# Patient Record
Sex: Female | Born: 1935 | Race: White | Hispanic: No | Smoking: Never smoker
Health system: Southern US, Community
[De-identification: ages and names within clinical notes are randomized; demographics above are authoritative.]

## PROBLEM LIST (undated history)

## (undated) DIAGNOSIS — K219 Gastro-esophageal reflux disease without esophagitis: Secondary | ICD-10-CM

## (undated) DIAGNOSIS — H3530 Unspecified macular degeneration: Secondary | ICD-10-CM

## (undated) DIAGNOSIS — D649 Anemia, unspecified: Secondary | ICD-10-CM

## (undated) DIAGNOSIS — M810 Age-related osteoporosis without current pathological fracture: Secondary | ICD-10-CM

## (undated) DIAGNOSIS — R053 Chronic cough: Secondary | ICD-10-CM

## (undated) DIAGNOSIS — K5901 Slow transit constipation: Secondary | ICD-10-CM

## (undated) DIAGNOSIS — H9190 Unspecified hearing loss, unspecified ear: Secondary | ICD-10-CM

## (undated) DIAGNOSIS — L853 Xerosis cutis: Secondary | ICD-10-CM

## (undated) DIAGNOSIS — E46 Unspecified protein-calorie malnutrition: Secondary | ICD-10-CM

## (undated) DIAGNOSIS — E785 Hyperlipidemia, unspecified: Secondary | ICD-10-CM

## (undated) DIAGNOSIS — R430 Anosmia: Secondary | ICD-10-CM

## (undated) DIAGNOSIS — I10 Essential (primary) hypertension: Secondary | ICD-10-CM

## (undated) DIAGNOSIS — I4891 Unspecified atrial fibrillation: Secondary | ICD-10-CM

## (undated) DIAGNOSIS — R05 Cough: Secondary | ICD-10-CM

## (undated) DIAGNOSIS — C4490 Unspecified malignant neoplasm of skin, unspecified: Secondary | ICD-10-CM

## (undated) DIAGNOSIS — F419 Anxiety disorder, unspecified: Secondary | ICD-10-CM

## (undated) DIAGNOSIS — R630 Anorexia: Secondary | ICD-10-CM

## (undated) DIAGNOSIS — E559 Vitamin D deficiency, unspecified: Secondary | ICD-10-CM

## (undated) HISTORY — DX: Unspecified malignant neoplasm of skin, unspecified: C44.90

## (undated) HISTORY — DX: Unspecified protein-calorie malnutrition: E46

## (undated) HISTORY — DX: Age-related osteoporosis without current pathological fracture: M81.0

## (undated) HISTORY — DX: Hyperlipidemia, unspecified: E78.5

## (undated) HISTORY — DX: Chronic cough: R05.3

## (undated) HISTORY — DX: Unspecified hearing loss, unspecified ear: H91.90

## (undated) HISTORY — DX: Anorexia: R63.0

## (undated) HISTORY — DX: Anosmia: R43.0

## (undated) HISTORY — DX: Anxiety disorder, unspecified: F41.9

## (undated) HISTORY — DX: Xerosis cutis: L85.3

## (undated) HISTORY — DX: Unspecified macular degeneration: H35.30

## (undated) HISTORY — DX: Slow transit constipation: K59.01

## (undated) HISTORY — DX: Gastro-esophageal reflux disease without esophagitis: K21.9

## (undated) HISTORY — DX: Essential (primary) hypertension: I10

## (undated) HISTORY — DX: Unspecified atrial fibrillation: I48.91

## (undated) HISTORY — DX: Anemia, unspecified: D64.9

## (undated) HISTORY — DX: Cough: R05

---
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
## 2004-07-20 ENCOUNTER — Encounter: Admission: RE | Admit: 2004-07-20 | Discharge: 2004-07-20 | Payer: Self-pay | Admitting: Internal Medicine

## 2005-07-26 ENCOUNTER — Encounter: Admission: RE | Admit: 2005-07-26 | Discharge: 2005-07-26 | Payer: Self-pay | Admitting: Internal Medicine

## 2006-08-08 ENCOUNTER — Encounter: Admission: RE | Admit: 2006-08-08 | Discharge: 2006-08-08 | Payer: Self-pay | Admitting: Internal Medicine

## 2007-07-02 ENCOUNTER — Encounter: Payer: Self-pay | Admitting: Endocrinology

## 2007-08-14 DIAGNOSIS — I10 Essential (primary) hypertension: Secondary | ICD-10-CM

## 2007-08-14 DIAGNOSIS — I4891 Unspecified atrial fibrillation: Secondary | ICD-10-CM | POA: Insufficient documentation

## 2007-08-14 DIAGNOSIS — M810 Age-related osteoporosis without current pathological fracture: Secondary | ICD-10-CM | POA: Insufficient documentation

## 2007-08-14 DIAGNOSIS — E785 Hyperlipidemia, unspecified: Secondary | ICD-10-CM | POA: Insufficient documentation

## 2007-08-14 DIAGNOSIS — G4700 Insomnia, unspecified: Secondary | ICD-10-CM

## 2007-08-14 DIAGNOSIS — M1990 Unspecified osteoarthritis, unspecified site: Secondary | ICD-10-CM

## 2007-08-15 ENCOUNTER — Ambulatory Visit: Payer: Self-pay | Admitting: Endocrinology

## 2007-08-15 ENCOUNTER — Encounter: Admission: RE | Admit: 2007-08-15 | Discharge: 2007-08-15 | Payer: Self-pay | Admitting: Internal Medicine

## 2007-08-15 DIAGNOSIS — R079 Chest pain, unspecified: Secondary | ICD-10-CM

## 2007-08-15 DIAGNOSIS — E042 Nontoxic multinodular goiter: Secondary | ICD-10-CM | POA: Insufficient documentation

## 2007-08-29 ENCOUNTER — Encounter: Payer: Self-pay | Admitting: Endocrinology

## 2008-08-15 ENCOUNTER — Encounter: Admission: RE | Admit: 2008-08-15 | Discharge: 2008-08-15 | Payer: Self-pay | Admitting: Internal Medicine

## 2009-08-17 ENCOUNTER — Encounter: Admission: RE | Admit: 2009-08-17 | Discharge: 2009-08-17 | Payer: Self-pay | Admitting: Internal Medicine

## 2010-03-28 ENCOUNTER — Encounter: Payer: Self-pay | Admitting: Internal Medicine

## 2010-05-18 ENCOUNTER — Encounter (INDEPENDENT_AMBULATORY_CARE_PROVIDER_SITE_OTHER): Payer: Medicare Other | Admitting: Vascular Surgery

## 2010-05-18 DIAGNOSIS — I723 Aneurysm of iliac artery: Secondary | ICD-10-CM

## 2010-05-18 NOTE — Consult Note (Signed)
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  She had no discrete abdominal pain and part of her evaluation included a CT scan of the abdomen which revealed a small left common iliac artery aneurysm measuring maximum of 1.8 cm in diameter.  She also had a fibroid tumor on the uterus and will be having a gynecological evaluation in the next week.  She has no previous history of aneurysm disease.  CHRONIC MEDICAL PROBLEMS: 1. Hypertension. 2. Macular degeneration. 3. Mild hyperlipidemia. 4. Bilateral cataracts. 5. History of a benign breast mass. 6. Negative for coronary artery disease, diabetes, COPD, or stroke.  SOCIAL HISTORY:  She is widowed, has no children.  She is a retired Engineer, civil (consulting).  Does not use tobacco or alcohol.  FAMILY HISTORY:  Positive for coronary artery disease and stroke in her sisters.  Negative for diabetes.  REVIEW OF SYSTEMS:  Positive for leg discomfort with walking, occasional swelling in her right leg.  Varicose veins in the right leg.  A history of chest heaviness on 2 occasions in the past with negative cardiac workup.  History of reflux esophagitis.  All other systems in a complete review of systems are negative.  PHYSICAL EXAMINATION:  Blood pressure 111/73, heart rate is 92, respirations 14.  Generally, she is a well-developed, well-nourished female who is in no apparent distress.  She is alert and oriented x3. HEENT:  Normal for age.  EOMs intact.  Lungs:  Clear to auscultation. No rhonchi or wheezing.  Cardiovascular:  Regular rhythm.  No murmurs. Carotid pulses are 3+.  No audible bruits.  Abdomen:  Soft, nontender. No pulsatile mass is noted.  Musculoskeletal:  Free of major deformities.  Neurologic:  Normal.  Skin:  Free of  rashes.  Lower extremity exam reveals some bulging varicosities in the right leg posteriorly in the popliteal fossa.  There is no hyperpigmentation, ulceration, or distal edema noted.  She has 3+ femoral and posterior tibial pulses palpable bilaterally.  Today I reviewed the medical records provided by Dr. Anders Grant office.  I also reviewed the CT scan which was performed on February 28 and interpreted by [facility].  IMPRESSION:  A small left iliac artery aneurysm measuring 1.8 cm in maximum diameter with no other evidence of aneurysmal disease and a small 5 cm fibroid arising from the right lateral wall of an atrophic uterus.  This aneurysm of the right iliac artery is not related to her present illness of nausea, which has subsequently resolved.  It does not need follow-up for 2 years, at which time a CT scan should be performed to be sure this is not enlarging.  Doubtful that this will ever require any treatment.  A CT scan can be obtained by Dr. Shary Decamp, and if there is significant change in the size, we will be happy to see the patient again in followup.    Quita Skye Hart [place], M.D. Electronically Signed  JDL/MEDQ  D:  05/18/2010  T:  05/18/2010  Job:  [id number]  cc:   Feliciana Rossetti, MD Guy Franco, P.A.

## 2010-06-01 ENCOUNTER — Encounter: Payer: Self-pay | Admitting: Vascular Surgery

## 2010-09-09 ENCOUNTER — Other Ambulatory Visit: Payer: Self-pay | Admitting: Internal Medicine

## 2010-09-09 DIAGNOSIS — Z1231 Encounter for screening mammogram for malignant neoplasm of breast: Secondary | ICD-10-CM

## 2010-09-29 ENCOUNTER — Ambulatory Visit
Admission: RE | Admit: 2010-09-29 | Discharge: 2010-09-29 | Disposition: A | Discharge status: Home / Self Care | Payer: 59 | Source: Ambulatory Visit | Attending: Internal Medicine | Admitting: Internal Medicine

## 2010-09-29 DIAGNOSIS — Z1231 Encounter for screening mammogram for malignant neoplasm of breast: Secondary | ICD-10-CM

## 2010-09-30 ENCOUNTER — Other Ambulatory Visit: Payer: Self-pay | Admitting: Internal Medicine

## 2010-09-30 DIAGNOSIS — R928 Other abnormal and inconclusive findings on diagnostic imaging of breast: Secondary | ICD-10-CM

## 2010-10-06 ENCOUNTER — Ambulatory Visit
Admission: RE | Admit: 2010-10-06 | Discharge: 2010-10-06 | Disposition: A | Discharge status: Home / Self Care | Payer: Medicare Other | Source: Ambulatory Visit | Attending: Internal Medicine | Admitting: Internal Medicine

## 2010-10-06 DIAGNOSIS — R928 Other abnormal and inconclusive findings on diagnostic imaging of breast: Secondary | ICD-10-CM

## 2011-09-22 ENCOUNTER — Other Ambulatory Visit: Payer: Self-pay | Admitting: Internal Medicine

## 2011-09-22 DIAGNOSIS — Z1231 Encounter for screening mammogram for malignant neoplasm of breast: Secondary | ICD-10-CM

## 2011-10-13 ENCOUNTER — Ambulatory Visit: Payer: Medicare Other

## 2011-10-18 ENCOUNTER — Ambulatory Visit
Admission: RE | Admit: 2011-10-18 | Discharge: 2011-10-18 | Disposition: A | Discharge status: Home / Self Care | Payer: Medicare Other | Source: Ambulatory Visit | Attending: Internal Medicine | Admitting: Internal Medicine

## 2011-10-18 DIAGNOSIS — Z1231 Encounter for screening mammogram for malignant neoplasm of breast: Secondary | ICD-10-CM

## 2014-06-26 ENCOUNTER — Ambulatory Visit: Payer: Medicare Other

## 2014-06-30 ENCOUNTER — Encounter: Payer: Self-pay | Admitting: Podiatry

## 2014-06-30 ENCOUNTER — Ambulatory Visit (INDEPENDENT_AMBULATORY_CARE_PROVIDER_SITE_OTHER): Payer: Medicare Other | Admitting: Podiatry

## 2014-06-30 VITALS — BP 122/67 | HR 70 | Resp 12

## 2014-06-30 DIAGNOSIS — L609 Nail disorder, unspecified: Secondary | ICD-10-CM | POA: Diagnosis not present

## 2014-06-30 DIAGNOSIS — L608 Other nail disorders: Secondary | ICD-10-CM

## 2014-06-30 NOTE — Progress Notes (Signed)
   Subjective:    Patient ID: Jasmine Jones, female    DOB: 04/25/1925, 81 y.o.   MRN: [id number]  HPI N- SORE L-RT FOOT BALL OF THE FOOT D-7 DAYS O-SLOWLY C-MUCH BETTER A-TRIM T-NONE Patient is caregiver in treatment room today relates a history of a plantar callus that has sloughed recently without any professional treatment. She is also requesting nail debridement  ALSO PT REQUESTING FOR TOENAILS DEBRIDEMENT.   Review of Systems  HENT: Positive for hearing loss.   Eyes: Positive for visual disturbance.  Cardiovascular: Positive for leg swelling.  Musculoskeletal: Positive for back pain, joint swelling and gait problem.  Skin: Positive for color change.  Neurological: Positive for weakness.  Hematological: Bruises/bleeds easily.       Objective:   Physical Exam Confused patient hard of hearing that is not able to answer questions directly presents with her caregiver who is present in the treatment room today  Vascular: DP pulses trace palpable bilaterally PT pulses trace palpable bilaterally Mild pitting edema about ankles bilaterally  Neurological: Ankle reflex equal and reactive bilaterally Vibratory sensation patient not able to respond Sensation to 10 g monofilament wire patient unable respond  Dermatological: Atrophic skin without any skin lesions noted bilaterally Incurvated toenails 6-10   Musculoskeletal: Patient walks slowly with a walker HAV deformity right No restriction ankle, subtalar, midtarsal joints bilaterally        Assessment & Plan:   Assessment: Decrease pedal pulses possibly suggestive peripheral arterial disease Incurvated toenails, non-dystrophic 6-10  no evidence of plantar skin lesions  Plan: I advised caregiver that there was no evidence of skin lesions that needed any active treatment I debrided toenails 6-10 without a bleeding  I recommended that a pedicures trim the toenails with the following recommendations: Do  not soak feet in whirlpool Do not manipulate cuticles  Reappoint at patient's request

## 2014-06-30 NOTE — Patient Instructions (Signed)
Okay to go to the pedicurist to have your toenails trimmed Do not soak in the whirlpool Do not manipulate the cuticles

## 2016-11-13 DIAGNOSIS — K219 Gastro-esophageal reflux disease without esophagitis: Secondary | ICD-10-CM | POA: Diagnosis not present

## 2016-11-13 DIAGNOSIS — R0789 Other chest pain: Secondary | ICD-10-CM | POA: Diagnosis not present

## 2016-11-13 DIAGNOSIS — I10 Essential (primary) hypertension: Secondary | ICD-10-CM | POA: Diagnosis not present

## 2016-11-13 DIAGNOSIS — E785 Hyperlipidemia, unspecified: Secondary | ICD-10-CM | POA: Diagnosis not present

## 2016-11-14 DIAGNOSIS — R0789 Other chest pain: Secondary | ICD-10-CM | POA: Diagnosis not present

## 2016-11-14 DIAGNOSIS — K219 Gastro-esophageal reflux disease without esophagitis: Secondary | ICD-10-CM | POA: Diagnosis not present

## 2016-11-14 DIAGNOSIS — I4891 Unspecified atrial fibrillation: Secondary | ICD-10-CM | POA: Diagnosis not present

## 2016-11-14 DIAGNOSIS — E785 Hyperlipidemia, unspecified: Secondary | ICD-10-CM | POA: Diagnosis not present

## 2016-11-14 DIAGNOSIS — I10 Essential (primary) hypertension: Secondary | ICD-10-CM | POA: Diagnosis not present

## 2016-11-15 DIAGNOSIS — I4891 Unspecified atrial fibrillation: Secondary | ICD-10-CM

## 2017-11-15 ENCOUNTER — Encounter: Payer: Self-pay | Admitting: Nurse Practitioner

## 2018-01-26 DIAGNOSIS — E559 Vitamin D deficiency, unspecified: Secondary | ICD-10-CM

## 2018-01-26 HISTORY — DX: Vitamin D deficiency, unspecified: E55.9

## 2018-06-05 ENCOUNTER — Ambulatory Visit: Payer: Self-pay | Admitting: Nurse Practitioner

## 2018-07-03 ENCOUNTER — Ambulatory Visit: Payer: Self-pay | Admitting: Nurse Practitioner

## 2018-07-09 ENCOUNTER — Ambulatory Visit: Payer: Medicare Other | Admitting: Nurse Practitioner

## 2018-08-14 ENCOUNTER — Ambulatory Visit: Payer: Self-pay | Admitting: Nurse Practitioner

## 2018-08-29 ENCOUNTER — Encounter (HOSPITAL_COMMUNITY): Payer: Self-pay | Admitting: Radiology

## 2018-08-29 ENCOUNTER — Emergency Department (HOSPITAL_COMMUNITY)
Admission: EM | Admit: 2018-08-29 | Discharge: 2018-08-29 | Disposition: A | Discharge status: Skilled Nursing Facility | Payer: Medicare Other | Source: Home / Self Care | Attending: Emergency Medicine | Admitting: Emergency Medicine

## 2018-08-29 ENCOUNTER — Other Ambulatory Visit: Payer: Self-pay

## 2018-08-29 ENCOUNTER — Emergency Department (HOSPITAL_COMMUNITY): Payer: Medicare Other

## 2018-08-29 ENCOUNTER — Emergency Department (HOSPITAL_COMMUNITY)
Admission: EM | Admit: 2018-08-29 | Discharge: 2018-08-29 | Disposition: A | Discharge status: Home / Self Care | Payer: Medicare Other | Attending: Emergency Medicine | Admitting: Emergency Medicine

## 2018-08-29 DIAGNOSIS — I10 Essential (primary) hypertension: Secondary | ICD-10-CM | POA: Insufficient documentation

## 2018-08-29 DIAGNOSIS — S0101XA Laceration without foreign body of scalp, initial encounter: Secondary | ICD-10-CM

## 2018-08-29 DIAGNOSIS — Z23 Encounter for immunization: Secondary | ICD-10-CM | POA: Diagnosis not present

## 2018-08-29 DIAGNOSIS — S61519A Laceration without foreign body of unspecified wrist, initial encounter: Secondary | ICD-10-CM

## 2018-08-29 DIAGNOSIS — Y999 Unspecified external cause status: Secondary | ICD-10-CM | POA: Diagnosis not present

## 2018-08-29 DIAGNOSIS — S0990XA Unspecified injury of head, initial encounter: Secondary | ICD-10-CM

## 2018-08-29 DIAGNOSIS — W1830XA Fall on same level, unspecified, initial encounter: Secondary | ICD-10-CM | POA: Insufficient documentation

## 2018-08-29 DIAGNOSIS — S51011A Laceration without foreign body of right elbow, initial encounter: Secondary | ICD-10-CM | POA: Insufficient documentation

## 2018-08-29 DIAGNOSIS — Y92129 Unspecified place in nursing home as the place of occurrence of the external cause: Secondary | ICD-10-CM | POA: Diagnosis not present

## 2018-08-29 DIAGNOSIS — S0990XD Unspecified injury of head, subsequent encounter: Secondary | ICD-10-CM | POA: Insufficient documentation

## 2018-08-29 DIAGNOSIS — F0390 Unspecified dementia without behavioral disturbance: Secondary | ICD-10-CM | POA: Insufficient documentation

## 2018-08-29 DIAGNOSIS — Y939 Activity, unspecified: Secondary | ICD-10-CM | POA: Diagnosis not present

## 2018-08-29 DIAGNOSIS — S61512A Laceration without foreign body of left wrist, initial encounter: Secondary | ICD-10-CM | POA: Insufficient documentation

## 2018-08-29 DIAGNOSIS — X58XXXA Exposure to other specified factors, initial encounter: Secondary | ICD-10-CM | POA: Insufficient documentation

## 2018-08-29 DIAGNOSIS — I4891 Unspecified atrial fibrillation: Secondary | ICD-10-CM | POA: Insufficient documentation

## 2018-08-29 DIAGNOSIS — Z85828 Personal history of other malignant neoplasm of skin: Secondary | ICD-10-CM | POA: Diagnosis not present

## 2018-08-29 DIAGNOSIS — S0003XD Contusion of scalp, subsequent encounter: Secondary | ICD-10-CM | POA: Insufficient documentation

## 2018-08-29 DIAGNOSIS — S098XXA Other specified injuries of head, initial encounter: Secondary | ICD-10-CM | POA: Diagnosis present

## 2018-08-29 DIAGNOSIS — W19XXXA Unspecified fall, initial encounter: Secondary | ICD-10-CM

## 2018-08-29 MED ORDER — TETANUS-DIPHTH-ACELL PERTUSSIS 5-2.5-18.5 LF-MCG/0.5 IM SUSP
0.5000 mL | Freq: Once | INTRAMUSCULAR | Status: AC
  Administered 2018-08-29: 08:00:00 0.5 mL via INTRAMUSCULAR
  Filled 2018-08-29: qty 0.5

## 2018-08-29 NOTE — ED Triage Notes (Signed)
Patient brought in by EMS from [facility]) c/o unwitnessed Fall. Per EMS pt found face down in the floor on top of walker. Patient sustained laceration on left side of her head,  and skin tears on left hand and right elbow. Patient hx of dementia.

## 2018-08-29 NOTE — Discharge Instructions (Signed)
You were seen in the emergency department today with some oozing blood over the scalp.  We have applied a dressing this did not require suture, other repair, additional observation.  The patient had a CT scan of the head which was normal.  No further intervention at this time.  You may keep the dressing in place for the next 24 hours and then remove it.  Wash hair gently as needed so as not to disrupt the staples.

## 2018-08-29 NOTE — Discharge Instructions (Signed)
CT imaging of the head along with x-rays were normal.  Patient did receive staple closure of scalp laceration.  Staples will need to be removed in 7 days.  Apply wet to dry dressings in the left wrist, fingers, right elbow.  Return to the emergency department with any new or suddenly worsening symptoms.

## 2018-08-29 NOTE — Progress Notes (Signed)
08/29/2018 1145 Referral received to arrange [facility] to arrange [facility] Left message for [name] for return call.   3:04 pm Contacted [facility] and left message for a return call to arrange [facility]. Jonnie Finner RN CCM Case Mgmt phone [phone]

## 2018-08-29 NOTE — ED Provider Notes (Signed)
Emergency Department Provider Note   I have reviewed the triage vital signs and the nursing notes.   HISTORY  Chief Complaint Fall   HPI Jasmine Jones is a 85 y.o. female presents to the emergency department for evaluation after fall.  Patient has history of dementia and cannot give information regarding the fall or her symptoms.  She resides at [facility], a local skilled nursing facility.  According to EMS the patient was found facedown on the floor on top of her walker.  They noted a laceration to the left forehead and skin tears on the left hand and right elbow.  By documentation the patient is DNR and not anticoagulated.   Level 5 caveat: Dementia   Past Medical History:  Diagnosis Date   A-fib ([place])    Anemia    Dry skin    on ears   Gastroesophageal reflux    Hearing loss    significant  uses hearing aids   High blood pressure    Hyperlipidemia    Loss of appetite    Loss of smell    Macular degeneration    Malnutrition ([facility])    Osteoporosis    Persistent dry cough    Skin cancer    basal / squamous    Patient Active Problem List   Diagnosis Date Noted   GOITER, MULTINODULAR 08/15/2007   CHEST PAIN 08/15/2007   HYPERLIPIDEMIA 08/14/2007   INSOMNIA, CHRONIC 08/14/2007   HYPERTENSION 08/14/2007   ATRIAL FIBRILLATION 08/14/2007   OSTEOARTHRITIS 08/14/2007   OSTEOPOROSIS 08/14/2007    No past surgical history on file.  Allergies Patient has no known allergies.  Family History  Problem Relation Age of Onset   Atrial fibrillation Sister    Diabetes Sister    Osteoarthritis Sister    Osteopenia Sister    Mental retardation Sister     Social History Social History   Tobacco Use   Smoking status: Never Smoker   Smokeless tobacco: Never Used  Substance Use Topics   Alcohol use: No    Alcohol/week: 0.0 standard drinks   Drug use: No    Review of Systems  Level 5 caveat: Dementia    ____________________________________________   PHYSICAL EXAM:  VITAL SIGNS: ED Triage Vitals [08/29/18 0706]  Enc Vitals Group     BP (!) 149/78     Pulse Rate 76     Resp 16     Temp 97.6 F (36.4 C)     Temp Source Oral     SpO2 100 %   Constitutional: Alert but confused. Well appearing and in no acute distress. Eyes: Conjunctivae are normal. PERRL.  Head: 4 cm left temporal laceration. No active bleeding.  Nose: No congestion/rhinnorhea. Mouth/Throat: Mucous membranes are moist.  Neck: No stridor. C collar in place.  Cardiovascular: Normal rate, regular rhythm. Good peripheral circulation. Grossly normal heart sounds.   Respiratory: Normal respiratory effort.  No retractions. Lungs CTAB. Gastrointestinal: Soft and nontender. No distention.  Musculoskeletal: Normal passive range of motion of bilateral hips, knees, ankles.  Patient's left hand with skin tear over the PIP joints. No visible tendon.  Range of motion of the bilateral shoulders, elbows, wrists (passive).  Neurologic:  Normal speech and language. No gross focal neurologic deficits are appreciated but exam limited by dementia and patient's ability to cooperate.  Skin:  Skin is warm and dry. Skin tare to the left PIP on the 2nd/3rd digits. Right elbow skin tare. Linear abrasion to the left abrasion.  ____________________________________________  RADIOLOGY  Dg Elbow 2 Views Right  Result Date: 08/29/2018 CLINICAL DATA:  Patient fell onto wheelchair, multiple cuts and abrasions. Patient is nonverbal and hd to be held for all images. Best obtainable images. EXAM: RIGHT ELBOW - 2 VIEW COMPARISON:  None. FINDINGS: There is no evidence of fracture, dislocation, or joint effusion. Osteopenia. There is no evidence of arthropathy or other focal bone abnormality. Soft tissues are unremarkable. IMPRESSION: Negative. Electronically Signed   By: Lucrezia Europe M.D.   On: 08/29/2018 08:36   Ct Head Wo Contrast  Result Date:  08/29/2018 CLINICAL DATA:  Fall at skilled nursing facility, unwitnessed fall, found face down on floor on top of walker, laceration LEFT side of head, history dementia, GERD, hypertension, atrial fibrillation EXAM: CT HEAD WITHOUT CONTRAST CT CERVICAL SPINE WITHOUT CONTRAST TECHNIQUE: Multidetector CT imaging of the head and cervical spine was performed following the standard protocol without intravenous contrast. Multiplanar CT image reconstructions of the cervical spine were also generated. COMPARISON:  10/22/2017 FINDINGS: CT HEAD FINDINGS Brain: Generalized atrophy. Normal ventricular morphology. No midline shift or mass effect. Small vessel chronic ischemic changes of deep cerebral white matter. LEFT frontal infarct with encephalomalacia. Resolution of previously identified intraparenchymal and subarachnoid hemorrhages since prior exam. No new intracranial hemorrhage, mass lesion or evidence of acute infarction. No extra-axial fluid collections. Vascular: Atherosclerotic calcification of internal carotid and vertebral arteries at skull base. Skull: LEFT temporoparietal scalp hematoma and laceration, minimally extending into for LEFT frontal region. Calvaria intact Sinuses/Orbits: Clear Other: N/A CT CERVICAL SPINE FINDINGS Alignment: Mild anterolisthesis C7-T1 and retrolisthesis at C3-C4 unchanged. Remaining alignments normal Skull base and vertebrae: Diffuse osseous demineralization. Multilevel facet degenerative changes. Multilevel disc space narrowing and endplate spur formation. Scattered encroachment upon cervical foramina by uncovertebral spurs greater on RIGHT. Visualized skull base intact. Incomplete posterior arch C1, developmental anomaly. Vertebral body heights maintained. No fracture or bone destruction. Soft tissues and spinal canal: Prevertebral soft tissues normal thickness. Atherosclerotic calcifications in the carotid systems. 14 mm chronic RIGHT thyroid nodule previously 15 mm. Disc levels:   As above Upper chest: Calcification scarring at LEFT apex again seen. Lung apices otherwise clear Other: N/A IMPRESSION: Atrophy with small vessel chronic ischemic changes of deep cerebral white matter. Old LEFT frontal infarct with resolution of intraparenchymal and extra-axial hemorrhage since previous exam. No new intracranial abnormalities. LEFT parietal temporal scalp hematoma. Multilevel degenerative disc and facet disease changes cervical spine as above. No acute cervical spine abnormalities. Electronically Signed   By: Lavonia Dana M.D.   On: 08/29/2018 09:19   Ct Cervical Spine Wo Contrast  Result Date: 08/29/2018 CLINICAL DATA:  Fall at skilled nursing facility, unwitnessed fall, found face down on floor on top of walker, laceration LEFT side of head, history dementia, GERD, hypertension, atrial fibrillation EXAM: CT HEAD WITHOUT CONTRAST CT CERVICAL SPINE WITHOUT CONTRAST TECHNIQUE: Multidetector CT imaging of the head and cervical spine was performed following the standard protocol without intravenous contrast. Multiplanar CT image reconstructions of the cervical spine were also generated. COMPARISON:  10/22/2017 FINDINGS: CT HEAD FINDINGS Brain: Generalized atrophy. Normal ventricular morphology. No midline shift or mass effect. Small vessel chronic ischemic changes of deep cerebral white matter. LEFT frontal infarct with encephalomalacia. Resolution of previously identified intraparenchymal and subarachnoid hemorrhages since prior exam. No new intracranial hemorrhage, mass lesion or evidence of acute infarction. No extra-axial fluid collections. Vascular: Atherosclerotic calcification of internal carotid and vertebral arteries at skull base. Skull: LEFT temporoparietal scalp hematoma and laceration,  minimally extending into for LEFT frontal region. Calvaria intact Sinuses/Orbits: Clear Other: N/A CT CERVICAL SPINE FINDINGS Alignment: Mild anterolisthesis C7-T1 and retrolisthesis at C3-C4 unchanged.  Remaining alignments normal Skull base and vertebrae: Diffuse osseous demineralization. Multilevel facet degenerative changes. Multilevel disc space narrowing and endplate spur formation. Scattered encroachment upon cervical foramina by uncovertebral spurs greater on RIGHT. Visualized skull base intact. Incomplete posterior arch C1, developmental anomaly. Vertebral body heights maintained. No fracture or bone destruction. Soft tissues and spinal canal: Prevertebral soft tissues normal thickness. Atherosclerotic calcifications in the carotid systems. 14 mm chronic RIGHT thyroid nodule previously 15 mm. Disc levels:  As above Upper chest: Calcification scarring at LEFT apex again seen. Lung apices otherwise clear Other: N/A IMPRESSION: Atrophy with small vessel chronic ischemic changes of deep cerebral white matter. Old LEFT frontal infarct with resolution of intraparenchymal and extra-axial hemorrhage since previous exam. No new intracranial abnormalities. LEFT parietal temporal scalp hematoma. Multilevel degenerative disc and facet disease changes cervical spine as above. No acute cervical spine abnormalities. Electronically Signed   By: Lavonia Dana M.D.   On: 08/29/2018 09:19   Dg Hand Complete Left  Result Date: 08/29/2018 CLINICAL DATA:  Patient fell onto wheelchair, multiple cuts and abrasions. Patient is nonverbal and hd to be held for all images. Best obtainable images. EXAM: LEFT HAND - COMPLETE 3+ VIEW COMPARISON:  None. FINDINGS: Nonstandard positioning. No definite fracture or dislocation. Diffuse osteopenia. Carpal rows intact. DJD at the first [facility] and STT articulations. Marginal spurring involving DIP joints of second-fifth fingers. IMPRESSION: 1. Negative for fracture or other acute bone abnormality. 2. Osteopenia and degenerative changes as above. Electronically Signed   By: Lucrezia Europe M.D.   On: 08/29/2018 08:35    ____________________________________________   PROCEDURES  Procedure(s)  performed:   Marland KitchenMarland KitchenLaceration Repair  Date/Time: 08/29/2018 10:07 AM Performed by: Margette Fast, MD Authorized by: Margette Fast, MD   Consent:    Consent obtained:  Verbal   Consent given by:  Patient Anesthesia (see MAR for exact dosages):    Anesthesia method:  Local infiltration   Local anesthetic:  Lidocaine 1% w/o epi Laceration details:    Location:  Scalp   Scalp location:  L temporal   Length (cm):  4 Repair type:    Repair type:  Simple Pre-procedure details:    Preparation:  Imaging obtained to evaluate for foreign bodies and patient was prepped and draped in usual sterile fashion Exploration:    Hemostasis achieved with:  Direct pressure   Wound exploration: entire depth of wound probed and visualized     Wound extent: no foreign bodies/material noted, no nerve damage noted, no underlying fracture noted and no vascular damage noted     Contaminated: no   Treatment:    Area cleansed with:  Betadine   Amount of cleaning:  Standard   Irrigation solution:  Sterile saline   Visualized foreign bodies/material removed: no   Skin repair:    Repair method:  Staples   Number of staples:  6 Approximation:    Approximation:  Close Post-procedure details:    Dressing:  Open (no dressing)   Patient tolerance of procedure:  Tolerated well, no immediate complications     ____________________________________________   INITIAL IMPRESSION / ASSESSMENT AND PLAN / ED COURSE  Pertinent labs & imaging results that were available during my care of the patient were reviewed by me and considered in my medical decision making (see chart for details).   Patient presents  to the emergency department for evaluation after fall.  Fall was unwitnessed.  Patient with dementia and unable to provide significant history regarding the event.  Plan for imaging.   CT imaging reviewed along with plain films.  No acute fractures or other findings.  Laceration of the scalp repaired with staples as  outlined above.  Will need to have staples removed in 7 days.  Will provide this and documentation for the patient's nursing facility.  They can be removed there by the primary care provider or the patient can return here.  ____________________________________________  FINAL CLINICAL IMPRESSION(S) / ED DIAGNOSES  Final diagnoses:  Injury of head, initial encounter  Fall, initial encounter  Laceration of scalp, initial encounter  Tear of skin of wrist, initial encounter    MEDICATIONS GIVEN DURING THIS VISIT:  Medications  Tdap (BOOSTRIX) injection 0.5 mL (0.5 mLs Intramuscular Given 08/29/18 0752)   Note:  This document was prepared using Dragon voice recognition software and may include unintentional dictation errors.  Nanda Quinton, MD Emergency Medicine    Hasana Alcorta, [facility], MD 08/29/18 1011

## 2018-08-29 NOTE — ED Notes (Signed)
Bed: WA20 Expected date:  Expected time:  Means of arrival:  Comments: 33F fall, head lac

## 2018-08-29 NOTE — ED Provider Notes (Signed)
Emergency Department Provider Note   I have reviewed the triage vital signs and the nursing notes.   HISTORY  Chief Complaint Bleeding/Bruising   HPI Miniya Miguez is a 85 y.o. female patient returns to the ED with 1 hour from her ALF with concern for continued bleeding. No additional falls. No change in mental status.   Level 5 caveat: Dementia.    Past Medical History:  Diagnosis Date   A-fib ([place])    Anemia    Dry skin    on ears   Gastroesophageal reflux    Hearing loss    significant  uses hearing aids   High blood pressure    Hyperlipidemia    Loss of appetite    Loss of smell    Macular degeneration    Malnutrition (HCC)    Osteoporosis    Persistent dry cough    Skin cancer    basal / squamous    Patient Active Problem List   Diagnosis Date Noted   GOITER, MULTINODULAR 08/15/2007   CHEST PAIN 08/15/2007   HYPERLIPIDEMIA 08/14/2007   INSOMNIA, CHRONIC 08/14/2007   HYPERTENSION 08/14/2007   ATRIAL FIBRILLATION 08/14/2007   OSTEOARTHRITIS 08/14/2007   OSTEOPOROSIS 08/14/2007    No past surgical history on file.  Allergies Patient has no known allergies.  Family History  Problem Relation Age of Onset   Atrial fibrillation Sister    Diabetes Sister    Osteoarthritis Sister    Osteopenia Sister    Mental retardation Sister     Social History Social History   Tobacco Use   Smoking status: Never Smoker   Smokeless tobacco: Never Used  Substance Use Topics   Alcohol use: No    Alcohol/week: 0.0 standard drinks   Drug use: No    Review of Systems  Level 5 caveat: Dementia.   ____________________________________________   PHYSICAL EXAM:  VITAL SIGNS: BP: 151/68 Pulse: 77 Resp: 18 Temp: 98.6 F SpO2: 100%   Constitutional: Alert but confused. Well appearing and in no acute distress. Eyes: Conjunctivae are normal.  Head: Laceration recently repaired is noted. No active bleeding. Trace  dried blood on the EMS pillow. No additional lacerations/abrasions noted.  Nose: No congestion/rhinnorhea. Skin:  Skin is warm and dry.   ____________________________________________  RADIOLOGY  Dg Elbow 2 Views Right  Result Date: 08/29/2018 CLINICAL DATA:  Patient fell onto wheelchair, multiple cuts and abrasions. Patient is nonverbal and hd to be held for all images. Best obtainable images. EXAM: RIGHT ELBOW - 2 VIEW COMPARISON:  None. FINDINGS: There is no evidence of fracture, dislocation, or joint effusion. Osteopenia. There is no evidence of arthropathy or other focal bone abnormality. Soft tissues are unremarkable. IMPRESSION: Negative. Electronically Signed   By: Lucrezia Europe M.D.   On: 08/29/2018 08:36   Ct Head Wo Contrast  Result Date: 08/29/2018 CLINICAL DATA:  Fall at skilled nursing facility, unwitnessed fall, found face down on floor on top of walker, laceration LEFT side of head, history dementia, GERD, hypertension, atrial fibrillation EXAM: CT HEAD WITHOUT CONTRAST CT CERVICAL SPINE WITHOUT CONTRAST TECHNIQUE: Multidetector CT imaging of the head and cervical spine was performed following the standard protocol without intravenous contrast. Multiplanar CT image reconstructions of the cervical spine were also generated. COMPARISON:  10/22/2017 FINDINGS: CT HEAD FINDINGS Brain: Generalized atrophy. Normal ventricular morphology. No midline shift or mass effect. Small vessel chronic ischemic changes of deep cerebral white matter. LEFT frontal infarct with encephalomalacia. Resolution of previously identified intraparenchymal and subarachnoid  hemorrhages since prior exam. No new intracranial hemorrhage, mass lesion or evidence of acute infarction. No extra-axial fluid collections. Vascular: Atherosclerotic calcification of internal carotid and vertebral arteries at skull base. Skull: LEFT temporoparietal scalp hematoma and laceration, minimally extending into for LEFT frontal region.  Calvaria intact Sinuses/Orbits: Clear Other: N/A CT CERVICAL SPINE FINDINGS Alignment: Mild anterolisthesis C7-T1 and retrolisthesis at C3-C4 unchanged. Remaining alignments normal Skull base and vertebrae: Diffuse osseous demineralization. Multilevel facet degenerative changes. Multilevel disc space narrowing and endplate spur formation. Scattered encroachment upon cervical foramina by uncovertebral spurs greater on RIGHT. Visualized skull base intact. Incomplete posterior arch C1, developmental anomaly. Vertebral body heights maintained. No fracture or bone destruction. Soft tissues and spinal canal: Prevertebral soft tissues normal thickness. Atherosclerotic calcifications in the carotid systems. 14 mm chronic RIGHT thyroid nodule previously 15 mm. Disc levels:  As above Upper chest: Calcification scarring at LEFT apex again seen. Lung apices otherwise clear Other: N/A IMPRESSION: Atrophy with small vessel chronic ischemic changes of deep cerebral white matter. Old LEFT frontal infarct with resolution of intraparenchymal and extra-axial hemorrhage since previous exam. No new intracranial abnormalities. LEFT parietal temporal scalp hematoma. Multilevel degenerative disc and facet disease changes cervical spine as above. No acute cervical spine abnormalities. Electronically Signed   By: Lavonia Dana M.D.   On: 08/29/2018 09:19   Ct Cervical Spine Wo Contrast  Result Date: 08/29/2018 CLINICAL DATA:  Fall at skilled nursing facility, unwitnessed fall, found face down on floor on top of walker, laceration LEFT side of head, history dementia, GERD, hypertension, atrial fibrillation EXAM: CT HEAD WITHOUT CONTRAST CT CERVICAL SPINE WITHOUT CONTRAST TECHNIQUE: Multidetector CT imaging of the head and cervical spine was performed following the standard protocol without intravenous contrast. Multiplanar CT image reconstructions of the cervical spine were also generated. COMPARISON:  10/22/2017 FINDINGS: CT HEAD FINDINGS  Brain: Generalized atrophy. Normal ventricular morphology. No midline shift or mass effect. Small vessel chronic ischemic changes of deep cerebral white matter. LEFT frontal infarct with encephalomalacia. Resolution of previously identified intraparenchymal and subarachnoid hemorrhages since prior exam. No new intracranial hemorrhage, mass lesion or evidence of acute infarction. No extra-axial fluid collections. Vascular: Atherosclerotic calcification of internal carotid and vertebral arteries at skull base. Skull: LEFT temporoparietal scalp hematoma and laceration, minimally extending into for LEFT frontal region. Calvaria intact Sinuses/Orbits: Clear Other: N/A CT CERVICAL SPINE FINDINGS Alignment: Mild anterolisthesis C7-T1 and retrolisthesis at C3-C4 unchanged. Remaining alignments normal Skull base and vertebrae: Diffuse osseous demineralization. Multilevel facet degenerative changes. Multilevel disc space narrowing and endplate spur formation. Scattered encroachment upon cervical foramina by uncovertebral spurs greater on RIGHT. Visualized skull base intact. Incomplete posterior arch C1, developmental anomaly. Vertebral body heights maintained. No fracture or bone destruction. Soft tissues and spinal canal: Prevertebral soft tissues normal thickness. Atherosclerotic calcifications in the carotid systems. 14 mm chronic RIGHT thyroid nodule previously 15 mm. Disc levels:  As above Upper chest: Calcification scarring at LEFT apex again seen. Lung apices otherwise clear Other: N/A IMPRESSION: Atrophy with small vessel chronic ischemic changes of deep cerebral white matter. Old LEFT frontal infarct with resolution of intraparenchymal and extra-axial hemorrhage since previous exam. No new intracranial abnormalities. LEFT parietal temporal scalp hematoma. Multilevel degenerative disc and facet disease changes cervical spine as above. No acute cervical spine abnormalities. Electronically Signed   By: Lavonia Dana M.D.    On: 08/29/2018 09:19   Dg Hand Complete Left  Result Date: 08/29/2018 CLINICAL DATA:  Patient fell onto wheelchair, multiple cuts and abrasions.  Patient is nonverbal and hd to be held for all images. Best obtainable images. EXAM: LEFT HAND - COMPLETE 3+ VIEW COMPARISON:  None. FINDINGS: Nonstandard positioning. No definite fracture or dislocation. Diffuse osteopenia. Carpal rows intact. DJD at the first [facility] and STT articulations. Marginal spurring involving DIP joints of second-fifth fingers. IMPRESSION: 1. Negative for fracture or other acute bone abnormality. 2. Osteopenia and degenerative changes as above. Electronically Signed   By: Lucrezia Europe M.D.   On: 08/29/2018 08:35    ____________________________________________   PROCEDURES  Procedure(s) performed:   Procedures  None ____________________________________________   INITIAL IMPRESSION / ASSESSMENT AND PLAN / ED COURSE  Pertinent labs & imaging results that were available during my care of the patient were reviewed by me and considered in my medical decision making (see chart for details).   No new injuries or areas of bleeding. Scalp dressing applied. Patient to remove in 24 hours. No additional observation indicated at this time.   ____________________________________________  FINAL CLINICAL IMPRESSION(S) / ED DIAGNOSES  Final diagnoses:  Injury of head, initial encounter    Note:  This document was prepared using Dragon voice recognition software and may include unintentional dictation errors.  Nanda Quinton, MD Emergency Medicine    Tahara Ruffini, [facility], MD 08/29/18 [phone]

## 2018-08-29 NOTE — ED Triage Notes (Signed)
She was sent to [place] shortly after her discharge here d/t reported "bleeding from her head wound". Dr. Laverta Baltimore (who had cared for this patient earlier today and had just d/c'd.,  met her upon her arrival and a [place] pressure dressing was placed per Dr. Hayden Pedro instruction; and per his instruction she was transported back to [facility]; the [facility] unit having waited with her. She was never removed from the stretcher and was d/c'd. Without incident. The [facility]AR crew leaves before I have an opportunity to check v.s. They presumably thought she was d/c'd. After speaking with Dr. Laverta Baltimore.

## 2018-08-30 ENCOUNTER — Telehealth: Payer: Self-pay | Admitting: *Deleted

## 2018-08-30 NOTE — Telephone Encounter (Addendum)
[facility] and spoke to [place], [facility] # [phone]. States they use [facility] for [facility]. Contacted [facility] rep, Ronalee Belts with new referral. Jonnie Finner RN CCM Case Mgmt phone [phone]  12:56 pm Received call back from [facility] from [facility], requesting [facility] orders and notes faxed to [phone]. Faxed to [facility].Spoke to [place], [facility] rep and referral accepted.   Jonnie Finner RN CCM Case Mgmt phone [phone]

## 2018-09-06 ENCOUNTER — Other Ambulatory Visit: Payer: Self-pay

## 2018-09-06 ENCOUNTER — Ambulatory Visit (INDEPENDENT_AMBULATORY_CARE_PROVIDER_SITE_OTHER): Payer: Medicare Other | Admitting: Nurse Practitioner

## 2018-09-06 ENCOUNTER — Encounter: Payer: Self-pay | Admitting: Nurse Practitioner

## 2018-09-06 VITALS — BP 124/70 | HR 68 | Temp 98.2°F | Resp 12 | Wt 104.6 lb

## 2018-09-06 DIAGNOSIS — I10 Essential (primary) hypertension: Secondary | ICD-10-CM

## 2018-09-06 DIAGNOSIS — E785 Hyperlipidemia, unspecified: Secondary | ICD-10-CM | POA: Diagnosis not present

## 2018-09-06 DIAGNOSIS — I480 Paroxysmal atrial fibrillation: Secondary | ICD-10-CM

## 2018-09-06 DIAGNOSIS — R413 Other amnesia: Secondary | ICD-10-CM

## 2018-09-06 DIAGNOSIS — R634 Abnormal weight loss: Secondary | ICD-10-CM

## 2018-09-06 DIAGNOSIS — S0003XD Contusion of scalp, subsequent encounter: Secondary | ICD-10-CM

## 2018-09-06 DIAGNOSIS — T148XXA Other injury of unspecified body region, initial encounter: Secondary | ICD-10-CM

## 2018-09-06 DIAGNOSIS — L089 Local infection of the skin and subcutaneous tissue, unspecified: Secondary | ICD-10-CM

## 2018-09-06 MED ORDER — DOXYCYCLINE HYCLATE 100 MG PO TABS: 100.0000 mg | ORAL_TABLET | Freq: Two times a day (BID) | ORAL | 0 refills | Status: DC

## 2018-09-06 NOTE — Patient Instructions (Signed)
We have sent over a home health referral to monitor hand wound  Doxycycline 100 mg by mouth twice daily for 7 days  To use florastor by mouth twice daily   To use xeroform to skin tear and cover, to change every 3 days (sooner if needed)  Notify for worsening redness, drainage, heat or pain.

## 2018-09-06 NOTE — Progress Notes (Signed)
Careteam: Patient Care Team: Raina Mina., MD as PCP - General (Internal Medicine) Jolene Schimke, MD as Referring Physician (Dermatology) Birder Robson, MD as Referring Physician (Ophthalmology) Jaymes Graff, DO as Consulting Physician (Orthopedic Surgery)  Advanced Directive information    No Known Allergies  Chief Complaint  Patient presents with   Establish Care    New patient Establish Care. Examine head for sutures. Sutures placed Wednesday. Patient lives at [facility] x 1 year. Here with neice [name] and caregiver [facility]    High Fall Risk      HPI: Patient is a 85 y.o. female seen in the office today to establish care. Pt is currently in assisted living at [facility]. She previously was living at home and had a fall in Aug 2019 resulting in large left frontal contusion and bilateral scattered traumatic SAH. She completed PT at [facility] and then went to assisted living. Family and caregivers have not been able to be with her due to [place] restrictions. Last week suffered another fall and went to the ED. She hit her head and required staples. She had a significant skin tear to her left hand.  Pt previously seeing doctor in [place] but he does not see pts who are in facility. Facility doctor's were seeing her but pt and family wanted her to go out to doctors office for appts.  Pt with hx of a fib, HOH, memory loss, htn, hyperlipidemia, osteoporosis, and others.   Falls- pt had bad fall at home in Aug 2018 which resulted in large left frontal contusion and bilateral scattered traumatic SAH, she also had left nondisplaced mandibular angle fracture in which she was hospitalized at [place], after fall went to rehab and then assisted living. Previously had caregiver at assisted living but due to [place] she was not allowed in, had another fall last week which resulted in head contusion and skin tear to left hand.   Severe arthritis- hip,  back and knees- taking tramadol with tylenol to control pain. Reports worsening pain in her hip recently and plan was to get hip injection. Gave her Rx for Voltaren and if it did not improve plan is to go back to orthopedic for injection.   Osteoporosis- continues on calcium and vit d supplement.   Anxiety- family does not feel like this is controlled. buspar 10 mg TID, zoloft 50 mg daily and alprazolam every 6 hours as needed. continues to have anxiety, easily upset.   Memory loss- does not understand about everything going on with COVID, causing a lot of anxiety.   GERD- currently on pepcid 20 mg which seems to be controlling symptoms.   htn- taking maxide 37.5-25 mg, metoprolol succinate   Hyperlipidemia- taking zocor 50 mg by mouth daily      Review of Systems: very HOH as well Review of Systems  Unable to perform ROS: Dementia    Past Medical History:  Diagnosis Date   A-fib ([place])    Anemia    Dry skin    on ears   Gastroesophageal reflux    Hearing loss    significant  uses hearing aids   High blood pressure    Hyperlipidemia    Loss of appetite    Loss of smell    Macular degeneration    Malnutrition (HCC)    Osteoporosis    Persistent dry cough    Skin cancer    basal / squamous  History reviewed. No pertinent surgical history. Social History:   reports that she has never smoked. She has never used smokeless tobacco. She reports that she does not drink alcohol or use drugs.  Family History  Problem Relation Age of Onset   Atrial fibrillation Sister    Diabetes Sister    Osteoarthritis Sister    Osteopenia Sister    Mental retardation Sister     Medications: Patient's Medications  New Prescriptions   No medications on file  Previous Medications   ACETAMINOPHEN (TYLENOL) 325 MG TABLET    Take 650 mg by mouth 2 (two) times a day. And every 6 hours as needed with Tramadol   ALPRAZOLAM (XANAX) 0.25 MG TABLET    Take 0.25 mg by mouth  every 6 (six) hours as needed for anxiety.   BUSPIRONE (BUSPAR) 10 MG TABLET    Take 10 mg by mouth 3 (three) times daily.   CALCIUM CARBONATE (OS-CAL) 600 MG TABS TABLET    Take 600 mg by mouth every 12 (twelve) hours.    CHOLECALCIFEROL (VITAMIN D) 25 MCG (1000 UT) TABLET    Take 2,000 Units by mouth daily.   DICLOFENAC SODIUM (VOLTAREN) 1 % GEL    Apply 1 g topically 4 (four) times daily. Apply to right hip   DOCUSATE SODIUM (COLACE) 100 MG CAPSULE    Take 100 mg by mouth every 12 (twelve) hours as needed for mild constipation.   FAMOTIDINE (PEPCID) 20 MG TABLET    Take 20 mg by mouth 2 (two) times daily.   LOPERAMIDE (ANTI-DIARRHEAL) 2 MG TABLET    Take 4 mg by mouth as needed for diarrhea or loose stools (After loose stool).   METOPROLOL SUCCINATE (TOPROL-XL) 25 MG 24 HR TABLET    Take 25 mg by mouth daily.    MULTIPLE VITAMIN (DAILY VITE) TABS    Take 1 tablet by mouth daily.   MULTIPLE VITAMINS-MINERALS (EQ VISION FORMULA 50+ PO)    Take 1 tablet by mouth every 12 (twelve) hours.   SERTRALINE (ZOLOFT) 50 MG TABLET    Take 50 mg by mouth daily.   SIMVASTATIN (ZOCOR) 20 MG TABLET    Take 20 mg by mouth daily.   TRAMADOL (ULTRAM) 50 MG TABLET    Take 50 mg by mouth 3 (three) times daily.    TRIAMTERENE-HYDROCHLOROTHIAZIDE (MAXZIDE-25) 37.5-25 MG TABLET    Take 0.5 tablets by mouth daily.   Modified Medications   No medications on file  Discontinued Medications   ALPRAZOLAM (XANAX) 0.25 MG TABLET    Take 0.25 mg by mouth every 6 (six) hours as needed for anxiety.   BUSPIRONE (BUSPAR) 5 MG TABLET    Take 5 mg by mouth 3 (three) times daily.   NYSTATIN CREAM (MYCOSTATIN)    Apply 1 application topically 3 (three) times daily. Apply to neck and back   OMEPRAZOLE (PRILOSEC) 20 MG CAPSULE    Take 20 mg by mouth daily.   POLYETHYL GLYCOL-PROPYL GLYCOL (SYSTANE) 0.4-0.3 % SOLN    2 drops. Place 2 drops into affected eye every hour as needed for dry eyes/itching   SIMVASTATIN (ZOCOR) 40 MG TABLET     Take 40 mg by mouth daily.    Physical Exam:  Vitals:   09/06/18 1335  BP: 124/70  Pulse: 68  Resp: 12  Temp: 98.2 F (36.8 C)  TempSrc: Oral  SpO2: 95%  Weight: 104 lb 9.6 oz (47.4 kg)   There is no height or weight  on file to calculate BMI. Wt Readings from Last 3 Encounters:  09/06/18 104 lb 9.6 oz (47.4 kg)    Physical Exam Constitutional:      General: She is not in acute distress.    Appearance: She is underweight. She is not diaphoretic.  HENT:     Head: Normocephalic.     Comments: Bruising in different stages to left side of face from scalp, eyes, left check and neck Staples noted, removed and pt tolerated well    Right Ear: Ear canal normal.     Left Ear: Ear canal normal.     Mouth/Throat:     Pharynx: No oropharyngeal exudate.  Eyes:     Conjunctiva/sclera: Conjunctivae normal.     Pupils: Pupils are equal, round, and reactive to light.  Neck:     Musculoskeletal: Normal range of motion and neck supple.  Cardiovascular:     Rate and Rhythm: Normal rate and regular rhythm.     Heart sounds: Normal heart sounds.  Pulmonary:     Effort: Pulmonary effort is normal.     Breath sounds: Normal breath sounds.  Abdominal:     General: Bowel sounds are normal.     Palpations: Abdomen is soft.  Musculoskeletal:        General: No tenderness.  Skin:    General: Skin is warm and dry.     Comments: Large skin tear to back of hand with yellow/gray drainage, redness surrounding area. Abrasions also noted on 1st and 2nd finger.   Neurological:     Mental Status: She is alert.  Psychiatric:        Cognition and Memory: Cognition is impaired. Memory is impaired.     Labs reviewed: Basic Metabolic Panel: No results for input(s): NA, K, CL, CO2, GLUCOSE, BUN, CREATININE, CALCIUM, MG, PHOS, TSH in the last 8760 hours. Liver Function Tests: No results for input(s): AST, ALT, ALKPHOS, BILITOT, PROT, ALBUMIN in the last 8760 hours. No results for input(s): LIPASE,  AMYLASE in the last 8760 hours. No results for input(s): AMMONIA in the last 8760 hours. CBC: No results for input(s): WBC, NEUTROABS, HGB, HCT, MCV, PLT in the last 8760 hours. Lipid Panel: No results for input(s): CHOL, HDL, LDLCALC, TRIG, CHOLHDL, LDLDIRECT in the last 8760 hours. TSH: No results for input(s): TSH in the last 8760 hours. A1C: No results found for: HGBA1C   Assessment/Plan 1. Paroxysmal atrial fibrillation (HCC) Rate controlled, not on anticoagulation due to multiple falls with intracranial bleed.   2. Memory loss -progressive memory loss, reviewed CT of the head with family and caregiver. She may benefit from namenda to help with anxiety related to memory loss. Will discuss more on follow up.  - TSH - COMPLETE METABOLIC PANEL WITH GFR - CBC with Differential/Platelet  3. Hyperlipidemia LDL goal <100 - on zocor daily - COMPLETE METABOLIC PANEL WITH GFR - Lipid Panel  4. Weight loss -noted by family and caregiver, encouraged to use nutritional supplement daily. Encourage 3 meals a day. To avoid snacks with good nutritional value that can cause her to feel full but not provide nutirtion or protein.  - TSH - COMPLETE METABOLIC PANEL WITH GFR - CBC with Differential/Platelet  5. Infected skin tear -to apple xeroform dressing to left hand, to change every 3 days, sooner if needed. - Ambulatory referral to [place]- for nursing to monitor.  - doxycycline (VIBRA-TABS) 100 MG tablet; Take 1 tablet (100 mg total) by mouth 2 (two) times daily.  Dispense: 14 tablet; Refill: 0  6. Contusion of scalp, subsequent encounter Area cleaned and staples removed, pt tolerated well. Unable to place steri strips due to hair  7. Essential hypertension Controlled on current regimen  Next appt: 09/28/2018 for follow up [place]. [place], [facility] [phone]

## 2018-09-07 LAB — CBC WITH DIFFERENTIAL/PLATELET
Absolute Monocytes: 824 cells/uL (ref 200–950)
Basophils Absolute: 31 cells/uL (ref 0–200)
Basophils Relative: 0.4 %
Eosinophils Absolute: 31 cells/uL (ref 15–500)
Eosinophils Relative: 0.4 %
HCT: 40.4 % (ref 35.0–45.0)
Hemoglobin: 13 g/dL (ref 11.7–15.5)
Lymphs Abs: 1201 cells/uL (ref 850–3900)
MCH: 26.9 pg — ABNORMAL LOW (ref 27.0–33.0)
MCHC: 32.2 g/dL (ref 32.0–36.0)
MCV: 83.6 fL (ref 80.0–100.0)
MPV: 10.5 fL (ref 7.5–12.5)
Monocytes Relative: 10.7 %
Neutro Abs: 5613 cells/uL (ref 1500–7800)
Neutrophils Relative %: 72.9 %
Platelets: 233 10*3/uL (ref 140–400)
RBC: 4.83 10*6/uL (ref 3.80–5.10)
RDW: 12.4 % (ref 11.0–15.0)
Total Lymphocyte: 15.6 %
WBC: 7.7 10*3/uL (ref 3.8–10.8)

## 2018-09-07 LAB — LIPID PANEL
Cholesterol: 116 mg/dL (ref <200)
HDL: 55 mg/dL (ref >=50)
LDL Cholesterol (Calc): 43 mg/dL (calc)
Non-HDL Cholesterol (Calc): 61 mg/dL (calc) (ref <130)
Total CHOL/HDL Ratio: 2.1 (calc) (ref <5.0)
Triglycerides: 101 mg/dL (ref <150)

## 2018-09-07 LAB — COMPLETE METABOLIC PANEL WITH GFR
AG Ratio: 2 (calc) (ref 1.0–2.5)
ALT: 26 U/L (ref 6–29)
AST: 27 U/L (ref 10–35)
Albumin: 3.8 g/dL (ref 3.6–5.1)
Alkaline phosphatase (APISO): 90 U/L (ref 37–153)
BUN: 13 mg/dL (ref 7–25)
CO2: 28 mmol/L (ref 20–32)
Calcium: 9.2 mg/dL (ref 8.6–10.4)
Chloride: 96 mmol/L — ABNORMAL LOW (ref 98–110)
Creat: 0.61 mg/dL (ref 0.60–0.88)
GFR, Est African American: 92 mL/min/{1.73_m2} (ref >=60)
GFR, Est Non African American: 79 mL/min/{1.73_m2} (ref >=60)
Globulin: 1.9 g/dL (calc) (ref 1.9–3.7)
Glucose, Bld: 85 mg/dL (ref 65–99)
Potassium: 4.2 mmol/L (ref 3.5–5.3)
Sodium: 134 mmol/L — ABNORMAL LOW (ref 135–146)
Total Bilirubin: 1 mg/dL (ref 0.2–1.2)
Total Protein: 5.7 g/dL — ABNORMAL LOW (ref 6.1–8.1)

## 2018-09-07 LAB — TSH: TSH: 0.38 mIU/L — ABNORMAL LOW (ref 0.40–4.50)

## 2018-09-08 DIAGNOSIS — S61412D Laceration without foreign body of left hand, subsequent encounter: Secondary | ICD-10-CM

## 2018-09-08 DIAGNOSIS — I480 Paroxysmal atrial fibrillation: Secondary | ICD-10-CM

## 2018-09-08 DIAGNOSIS — M810 Age-related osteoporosis without current pathological fracture: Secondary | ICD-10-CM

## 2018-09-08 DIAGNOSIS — S61213D Laceration without foreign body of left middle finger without damage to nail, subsequent encounter: Secondary | ICD-10-CM

## 2018-09-08 DIAGNOSIS — D649 Anemia, unspecified: Secondary | ICD-10-CM

## 2018-09-08 DIAGNOSIS — E042 Nontoxic multinodular goiter: Secondary | ICD-10-CM

## 2018-09-08 DIAGNOSIS — S61211D Laceration without foreign body of left index finger without damage to nail, subsequent encounter: Secondary | ICD-10-CM

## 2018-09-08 DIAGNOSIS — I10 Essential (primary) hypertension: Secondary | ICD-10-CM

## 2018-09-10 ENCOUNTER — Telehealth: Payer: Self-pay

## 2018-09-10 NOTE — Telephone Encounter (Signed)
Kenney Houseman, nurse manager with [facility] called requesting last OV note. I advised that we have only seen patient x 1 and I will send note once signed.

## 2018-09-10 NOTE — Telephone Encounter (Signed)
Note faxed as requested

## 2018-09-10 NOTE — Telephone Encounter (Signed)
Noted, and note completed

## 2018-09-10 NOTE — Telephone Encounter (Signed)
Pam with [facility] called requesting verbal orders to treat skin tear on hand twice weekly x 8 weeks.  Per [employer] standing order, verbal order given. Message will be sent to patient's provider as a FYI.

## 2018-09-11 ENCOUNTER — Telehealth: Payer: Self-pay | Admitting: *Deleted

## 2018-09-11 NOTE — Telephone Encounter (Signed)
Jasmine Jones with [facility] called and stated that he needed verbal orders for OT 2x3wks and 1x1wk.  Verbal Orders given.

## 2018-09-16 ENCOUNTER — Emergency Department (HOSPITAL_COMMUNITY)
Admission: EM | Admit: 2018-09-16 | Discharge: 2018-09-17 | Disposition: A | Discharge status: Home / Self Care | Payer: Medicare Other | Attending: Emergency Medicine | Admitting: Emergency Medicine

## 2018-09-16 ENCOUNTER — Other Ambulatory Visit: Payer: Self-pay

## 2018-09-16 ENCOUNTER — Emergency Department (HOSPITAL_COMMUNITY): Payer: Medicare Other

## 2018-09-16 ENCOUNTER — Encounter (HOSPITAL_COMMUNITY): Payer: Self-pay | Admitting: Emergency Medicine

## 2018-09-16 DIAGNOSIS — F410 Panic disorder [episodic paroxysmal anxiety] without agoraphobia: Secondary | ICD-10-CM | POA: Insufficient documentation

## 2018-09-16 DIAGNOSIS — W19XXXA Unspecified fall, initial encounter: Secondary | ICD-10-CM | POA: Insufficient documentation

## 2018-09-16 DIAGNOSIS — Z791 Long term (current) use of non-steroidal anti-inflammatories (NSAID): Secondary | ICD-10-CM | POA: Insufficient documentation

## 2018-09-16 DIAGNOSIS — F0390 Unspecified dementia without behavioral disturbance: Secondary | ICD-10-CM | POA: Diagnosis not present

## 2018-09-16 DIAGNOSIS — I10 Essential (primary) hypertension: Secondary | ICD-10-CM | POA: Insufficient documentation

## 2018-09-16 DIAGNOSIS — Z79899 Other long term (current) drug therapy: Secondary | ICD-10-CM | POA: Insufficient documentation

## 2018-09-16 NOTE — ED Triage Notes (Signed)
Patient denies any pain when asked or palpated, but yelled out when being moved.

## 2018-09-16 NOTE — ED Triage Notes (Signed)
Patient BIB [facility] from [facility]. Pt had a panic attack post fall. Fall was unwitnessed, recent fall this week. Skin tear to left knuckle, bruising from previous fall. Pt was found in floor next to bed, muscles locked up, repeating "I'm going to to fall, help me im going to fall". Pt was tachy, and diaphoretic and reaching for things to hold on to. Pt did was not given her xanax this evening by facility. Pt is not on bld thinners. Pt given 2.5 mg versed iv by EMS. Pt now resting comfortably.

## 2018-09-17 ENCOUNTER — Emergency Department (HOSPITAL_COMMUNITY): Payer: Medicare Other

## 2018-09-17 LAB — URINALYSIS, ROUTINE W REFLEX MICROSCOPIC
Bilirubin Urine: NEGATIVE
Glucose, UA: NEGATIVE mg/dL
Hgb urine dipstick: NEGATIVE
Ketones, ur: NEGATIVE mg/dL
Leukocytes,Ua: NEGATIVE
Nitrite: NEGATIVE
Protein, ur: NEGATIVE mg/dL
Specific Gravity, Urine: 1.006 (ref 1.005–1.030)
pH: 7 (ref 5.0–8.0)

## 2018-09-17 LAB — COMPREHENSIVE METABOLIC PANEL
ALT: 28 U/L (ref 0–44)
AST: 54 U/L — ABNORMAL HIGH (ref 15–41)
Albumin: 3.7 g/dL (ref 3.5–5.0)
Alkaline Phosphatase: 81 U/L (ref 38–126)
Anion gap: 13 (ref 5–15)
BUN: 15 mg/dL (ref 8–23)
CO2: 25 mmol/L (ref 22–32)
Calcium: 8.8 mg/dL — ABNORMAL LOW (ref 8.9–10.3)
Chloride: 97 mmol/L — ABNORMAL LOW (ref 98–111)
Creatinine, Ser: 0.62 mg/dL (ref 0.44–1.00)
GFR calc Af Amer: >60 mL/min (ref >60)
GFR calc non Af Amer: >60 mL/min (ref >60)
Glucose, Bld: 92 mg/dL (ref 70–99)
Potassium: 3.8 mmol/L (ref 3.5–5.1)
Sodium: 135 mmol/L (ref 135–145)
Total Bilirubin: 1.4 mg/dL — ABNORMAL HIGH (ref 0.3–1.2)
Total Protein: 5.9 g/dL — ABNORMAL LOW (ref 6.5–8.1)

## 2018-09-17 LAB — CBC WITH DIFFERENTIAL/PLATELET
Abs Immature Granulocytes: 0.04 10*3/uL (ref 0.00–0.07)
Basophils Absolute: 0 10*3/uL (ref 0.0–0.1)
Basophils Relative: 0 %
Eosinophils Absolute: 0 10*3/uL (ref 0.0–0.5)
Eosinophils Relative: 0 %
HCT: 39.6 % (ref 36.0–46.0)
Hemoglobin: 12.5 g/dL (ref 12.0–15.0)
Immature Granulocytes: 0 %
Lymphocytes Relative: 15 %
Lymphs Abs: 1.5 10*3/uL (ref 0.7–4.0)
MCH: 26.7 pg (ref 26.0–34.0)
MCHC: 31.6 g/dL (ref 30.0–36.0)
MCV: 84.4 fL (ref 80.0–100.0)
Monocytes Absolute: 1 10*3/uL (ref 0.1–1.0)
Monocytes Relative: 9 %
Neutro Abs: 7.8 10*3/uL — ABNORMAL HIGH (ref 1.7–7.7)
Neutrophils Relative %: 76 %
Platelets: 193 10*3/uL (ref 150–400)
RBC: 4.69 MIL/uL (ref 3.87–5.11)
RDW: 13.9 % (ref 11.5–15.5)
WBC: 10.3 10*3/uL (ref 4.0–10.5)
nRBC: 0 % (ref 0.0–0.2)

## 2018-09-17 NOTE — ED Notes (Signed)
[facility] called for transportation  

## 2018-09-17 NOTE — ED Provider Notes (Signed)
[facility] DEPT Provider Note   CSN: [id number] Arrival date & time: 09/16/18  2249     History   Chief Complaint Chief Complaint  Patient presents with   Fall   Panic Attack    HPI Jasmine Jones is a 84 y.o. female.      Fall This is a recurrent problem. The current episode started 1 to 2 hours ago. The problem occurs constantly. The problem has not changed since onset.Pertinent negatives include no chest pain. Nothing aggravates the symptoms. Nothing relieves the symptoms. She has tried nothing for the symptoms.    Past Medical History:  Diagnosis Date   A-fib ([place])    Anemia    Dry skin    on ears   Gastroesophageal reflux    Hearing loss    significant  uses hearing aids   High blood pressure    Hyperlipidemia    Loss of appetite    Loss of smell    Macular degeneration    Malnutrition (HCC)    Osteoporosis    Persistent dry cough    Skin cancer    basal / squamous    Patient Active Problem List   Diagnosis Date Noted   GOITER, MULTINODULAR 08/15/2007   CHEST PAIN 08/15/2007   HYPERLIPIDEMIA 08/14/2007   INSOMNIA, CHRONIC 08/14/2007   HYPERTENSION 08/14/2007   ATRIAL FIBRILLATION 08/14/2007   OSTEOARTHRITIS 08/14/2007   OSTEOPOROSIS 08/14/2007    History reviewed. No pertinent surgical history.   OB History   No obstetric history on file.      Home Medications    Prior to Admission medications   Medication Sig Start Date End Date Taking? Authorizing Provider  acetaminophen (TYLENOL) 325 MG tablet Take 650 mg by mouth 2 (two) times a day. And every 6 hours as needed with Tramadol    [provider]  ALPRAZolam (XANAX) 0.25 MG tablet Take 0.25 mg by mouth every 6 (six) hours as needed for anxiety.    [provider]  busPIRone (BUSPAR) 10 MG tablet Take 10 mg by mouth 3 (three) times daily.    [provider]  calcium carbonate (OS-CAL) 600 MG TABS  tablet Take 600 mg by mouth every 12 (twelve) hours.     [provider]  cholecalciferol (VITAMIN D) 25 MCG (1000 UT) tablet Take 2,000 Units by mouth daily.    [provider]  diclofenac sodium (VOLTAREN) 1 % GEL Apply 1 g topically 4 (four) times daily. Apply to right hip    [provider]  docusate sodium (COLACE) 100 MG capsule Take 100 mg by mouth every 12 (twelve) hours as needed for mild constipation.    [provider]  doxycycline (VIBRA-TABS) 100 MG tablet Take 1 tablet (100 mg total) by mouth 2 (two) times daily. 09/06/18   [name], NP  famotidine (PEPCID) 20 MG tablet Take 20 mg by mouth 2 (two) times daily.    [provider]  loperamide (ANTI-DIARRHEAL) 2 MG tablet Take 4 mg by mouth as needed for diarrhea or loose stools (After loose stool).    [provider]  metoprolol succinate (TOPROL-XL) 25 MG 24 hr tablet Take 25 mg by mouth daily.     [provider]  Multiple Vitamin (DAILY VITE) TABS Take 1 tablet by mouth daily.    [provider]  Multiple Vitamins-Minerals (EQ VISION FORMULA 50+ PO) Take 1 tablet by mouth every 12 (twelve) hours.    [provider]  sertraline (ZOLOFT) 50 MG tablet Take 50 mg by mouth daily.    [provider]  simvastatin (ZOCOR) 20 MG tablet Take 20 mg by mouth daily.    [provider]  traMADol (ULTRAM) 50 MG tablet Take 50 mg by mouth 3 (three) times daily.     [provider]  triamterene-hydrochlorothiazide (MAXZIDE-25) 37.5-25 MG tablet Take 0.5 tablets by mouth daily.     [provider]    Family History Family History  Problem Relation Age of Onset   Atrial fibrillation Sister    Diabetes Sister    Osteoarthritis Sister    Osteopenia Sister    Mental retardation Sister     Social History Social History   Tobacco Use   Smoking status: Never Smoker   Smokeless tobacco: Never Used  Substance Use  Topics   Alcohol use: No    Alcohol/week: 0.0 standard drinks   Drug use: No     Allergies   Patient has no known allergies.   Review of Systems Review of Systems  Unable to perform ROS: Dementia  Cardiovascular: Negative for chest pain.     Physical Exam Updated Vital Signs BP 109/65 (BP Location: Right Arm)    Pulse 82    Temp 98.2 F (36.8 C) (Oral)    Resp 18    SpO2 96%   Physical Exam Vitals signs and nursing note reviewed.  Constitutional:      Appearance: She is well-developed.  HENT:     Head: Normocephalic and atraumatic.     Comments: c collar in place    Mouth/Throat:     Mouth: Mucous membranes are dry.     Pharynx: Oropharynx is clear.  Eyes:     Extraocular Movements: Extraocular movements intact.     Conjunctiva/sclera: Conjunctivae normal.  Neck:     Musculoskeletal: Normal range of motion.  Cardiovascular:     Rate and Rhythm: Normal rate and regular rhythm.  Pulmonary:     Effort: No respiratory distress.     Breath sounds: No stridor.  Abdominal:     General: There is no distension.  Musculoskeletal:        General: Signs of injury (left hand and right hand) present.  Skin:    General: Skin is warm and dry.  Neurological:     General: No focal deficit present.     Mental Status: She is alert.      ED Treatments / Results  Labs (all labs ordered are listed, but only abnormal results are displayed) Labs Reviewed  CBC WITH DIFFERENTIAL/PLATELET - Abnormal; Notable for the following components:      Result Value   Neutro Abs 7.8 (*)    All other components within normal limits  COMPREHENSIVE METABOLIC PANEL - Abnormal; Notable for the following components:   Chloride 97 (*)    Calcium 8.8 (*)    Total Protein 5.9 (*)    AST 54 (*)    Total Bilirubin 1.4 (*)    All other components within normal limits  URINALYSIS, ROUTINE W REFLEX MICROSCOPIC - Abnormal; Notable for the following components:   Color, Urine STRAW (*)    All  other components within normal limits    EKG EKG Interpretation  Date/Time:  Monday September 17 2018 01:41:53 EDT Ventricular Rate:  76 PR Interval:  190 QRS Duration: 144 QT Interval:  468 QTC Calculation: 526 R Axis:   -82 Text Interpretation:  Sinus rhythm with Premature atrial  complexes Right bundle branch block Left anterior fascicular block  Bifascicular block  Abnormal ECG No old tracing to compare Confirmed by Merrily Pew [phone]) on 09/17/2018 2:12:32 AM   Radiology Ct Head Wo Contrast  Result Date: 09/17/2018 CLINICAL DATA:  85 y/o  F; unwitnessed fall. EXAM: CT HEAD WITHOUT CONTRAST CT CERVICAL SPINE WITHOUT CONTRAST TECHNIQUE: Multidetector CT imaging of the head and cervical spine was performed following the standard protocol without intravenous contrast. Multiplanar CT image reconstructions of the cervical spine were also generated. COMPARISON:  08/29/2018 CT of head and cervical spine. FINDINGS: CT HEAD FINDINGS Brain: No evidence of acute infarction, hemorrhage, hydrocephalus, extra-axial collection or mass lesion/mass effect. Stable small chronic infarction within the left inferolateral frontal lobe. Stable very small chronic infarctions in left anterior limb of internal capsule and the left cerebellum. Stable nonspecific white matter hypodensities compatible with chronic microvascular ischemic changes. Stable volume loss of the brain. Vascular: Calcific atherosclerosis of the internal carotid arteries and vertebral arteries. Skull: Normal. Negative for fracture or focal lesion. Sinuses/Orbits: No acute finding. Other: Bilateral intra-ocular lens replacement. CT CERVICAL SPINE FINDINGS Alignment: Stable C7-T1 grade 1 anterolisthesis. Mild cervical levocurvature. Skull base and vertebrae: No acute fracture. No primary bone lesion or focal pathologic process. Soft tissues and spinal canal: No prevertebral fluid or swelling. No visible canal hematoma. Disc levels: Multilevel discogenic  degenerative changes with loss of intervertebral disc space height greatest at the C4-C6 levels. Right greater than left facet arthropathy. Uncovertebral and facet hypertrophy encroach on the neural foramen at the right C2-3, right C3-4, right C4-5, left C5-6, left C6-7 levels. Upper chest: Stable thyroid nodules measuring up to 15 mm in the right lobe. Other: Mild biapical pleuroparenchymal scarring. IMPRESSION: 1. No acute intracranial abnormality or calvarial fracture. 2. No acute fracture or dislocation of cervical spine. 3. Stable small chronic infarcts, chronic microvascular ischemic changes, and volume loss of the brain. 4. Stable moderate cervical spondylosis greatest at the C4-C6 levels. Electronically Signed   By: Kristine Garbe M.D.   On: 09/17/2018 01:40   Ct Cervical Spine Wo Contrast  Result Date: 09/17/2018 CLINICAL DATA:  85 y/o  F; unwitnessed fall. EXAM: CT HEAD WITHOUT CONTRAST CT CERVICAL SPINE WITHOUT CONTRAST TECHNIQUE: Multidetector CT imaging of the head and cervical spine was performed following the standard protocol without intravenous contrast. Multiplanar CT image reconstructions of the cervical spine were also generated. COMPARISON:  08/29/2018 CT of head and cervical spine. FINDINGS: CT HEAD FINDINGS Brain: No evidence of acute infarction, hemorrhage, hydrocephalus, extra-axial collection or mass lesion/mass effect. Stable small chronic infarction within the left inferolateral frontal lobe. Stable very small chronic infarctions in left anterior limb of internal capsule and the left cerebellum. Stable nonspecific white matter hypodensities compatible with chronic microvascular ischemic changes. Stable volume loss of the brain. Vascular: Calcific atherosclerosis of the internal carotid arteries and vertebral arteries. Skull: Normal. Negative for fracture or focal lesion. Sinuses/Orbits: No acute finding. Other: Bilateral intra-ocular lens replacement. CT CERVICAL SPINE  FINDINGS Alignment: Stable C7-T1 grade 1 anterolisthesis. Mild cervical levocurvature. Skull base and vertebrae: No acute fracture. No primary bone lesion or focal pathologic process. Soft tissues and spinal canal: No prevertebral fluid or swelling. No visible canal hematoma. Disc levels: Multilevel discogenic degenerative changes with loss of intervertebral disc space height greatest at the C4-C6 levels. Right greater than left facet arthropathy. Uncovertebral and facet hypertrophy encroach on the neural foramen at the right C2-3, right C3-4, right C4-5, left C5-6, left C6-7 levels. Upper  chest: Stable thyroid nodules measuring up to 15 mm in the right lobe. Other: Mild biapical pleuroparenchymal scarring. IMPRESSION: 1. No acute intracranial abnormality or calvarial fracture. 2. No acute fracture or dislocation of cervical spine. 3. Stable small chronic infarcts, chronic microvascular ischemic changes, and volume loss of the brain. 4. Stable moderate cervical spondylosis greatest at the C4-C6 levels. Electronically Signed   By: Kristine Garbe M.D.   On: 09/17/2018 01:40   Dg Hand Complete Left  Result Date: 09/17/2018 CLINICAL DATA:  Fall with hand pain and abrasion EXAM: LEFT HAND - COMPLETE 3+ VIEW COMPARISON:  08/29/2018 FINDINGS: Bones appear osteopenic, this limits evaluation for fracture. No definitive acute displaced fracture or subluxation. Scattered cysts within the carpal bones. Moderate arthritis at the first [facility] joint and STT interval. Degenerative narrowing of the second through fifth DIP and PIP joints. IMPRESSION: 1. No definite acute osseous abnormality 2. Arthritis of the hand and wrist Electronically Signed   By: Donavan Foil M.D.   On: 09/17/2018 00:17    Procedures Procedures (including critical care time)  Medications Ordered in ED Medications - No data to display   Initial Impression / Assessment and Plan / ED Course  I have reviewed the triage vital signs and the  nursing notes.  Pertinent labs & imaging results that were available during my care of the patient were reviewed by me and considered in my medical decision making (see chart for details).        Here after unwitnessed fall.  Low concern for syncope with a negative syncope work-up.  No obvious injuries patient is not tender anywhere.  X-rays CTs are unremarkable.  Will be returned to the facility.  Discussed with her niece over the phone.  Final Clinical Impressions(s) / ED Diagnoses   Final diagnoses:  Panic attack  Fall, initial encounter    ED Discharge Orders    None       Rasheena Talmadge, Corene Cornea, MD 09/17/18 [phone]

## 2018-09-24 ENCOUNTER — Telehealth: Payer: Self-pay | Admitting: Nurse Practitioner

## 2018-09-24 NOTE — Telephone Encounter (Signed)
[place] with [facility] called regarding POT orders for PT that was sent last week for Dr. Mariea Clonts to sign.

## 2018-09-24 NOTE — Telephone Encounter (Signed)
All paperwork that was addressed to me was signed.  Was this given to NP instead since pt is actually hers?

## 2018-09-24 NOTE — Telephone Encounter (Signed)
Spoke with Jasmine Jones, I  informed her that per Dr.Reed signed paperwork addressed to her and it was more than likely faxed backed to them today. Jasmine Jones will check and call back if additional assistance needed

## 2018-09-27 ENCOUNTER — Ambulatory Visit (INDEPENDENT_AMBULATORY_CARE_PROVIDER_SITE_OTHER): Payer: Medicare Other | Admitting: Nurse Practitioner

## 2018-09-27 ENCOUNTER — Other Ambulatory Visit: Payer: Self-pay

## 2018-09-27 ENCOUNTER — Encounter: Payer: Self-pay | Admitting: Nurse Practitioner

## 2018-09-27 VITALS — BP 108/50 | HR 66 | Temp 98.7°F | Ht 63.0 in | Wt 108.0 lb

## 2018-09-27 DIAGNOSIS — T148XXA Other injury of unspecified body region, initial encounter: Secondary | ICD-10-CM

## 2018-09-27 DIAGNOSIS — R634 Abnormal weight loss: Secondary | ICD-10-CM

## 2018-09-27 DIAGNOSIS — I480 Paroxysmal atrial fibrillation: Secondary | ICD-10-CM

## 2018-09-27 DIAGNOSIS — F0391 Unspecified dementia with behavioral disturbance: Secondary | ICD-10-CM

## 2018-09-27 DIAGNOSIS — L089 Local infection of the skin and subcutaneous tissue, unspecified: Secondary | ICD-10-CM

## 2018-09-27 DIAGNOSIS — M1990 Unspecified osteoarthritis, unspecified site: Secondary | ICD-10-CM

## 2018-09-27 DIAGNOSIS — I10 Essential (primary) hypertension: Secondary | ICD-10-CM

## 2018-09-27 DIAGNOSIS — F419 Anxiety disorder, unspecified: Secondary | ICD-10-CM

## 2018-09-27 MED ORDER — NAMENDA XR TITRATION PACK 7 & 14 & 21 &28 MG PO CP24: ORAL_CAPSULE | ORAL | 0 refills | Status: DC

## 2018-09-27 MED ORDER — MEMANTINE HCL ER 28 MG PO CP24: 28.0000 mg | ORAL_CAPSULE | Freq: Every day | ORAL | 3 refills | Status: DC

## 2018-09-27 NOTE — Progress Notes (Signed)
Careteam: Patient Care Team: [name], NP as PCP - General (Geriatric Medicine) Jolene Schimke, MD as Referring Physician (Dermatology) Birder Robson, MD as Referring Physician (Ophthalmology) Jaymes Graff, DO as Consulting Physician (Orthopedic Surgery)  Advanced Directive information    No Known Allergies  Chief Complaint  Patient presents with  . Follow-up    ER follow-up, discuss swelling in legs. Discuss cognitive function and emotional esisodes. High risk fall. Here with caregiver and neice [name]      HPI: Patient is a 85 y.o. female seen in the office today for follow up  Pt with hx of behaviors and anxiety, told her caregiver that her mind was not working and did not know what to do. Did not want to come to the visit today. Stated her mind was not working right.  Generally caregivers are not allowed in the facility but allowed her personal caregiver to come in to calm her down.  She has had a lot falls. Required 24/7 sitter which agitated her significantly.  She has had a dramatic change in her cognitive status since fall in Aug 2019.  Anxiety- has noted improvement in overall anxiety since she was started on zoloft and buspar   Weight loss- up 4 lbs since last visit. Started ensure.   PT and OT came back in and evaluated. Has fallen 3 times since last OV.   Bowels have been moving well.     Review of Systems:  Review of Systems  Unable to perform ROS: Dementia    Past Medical History:  Diagnosis Date  . A-fib ([place])   . Anemia   . Dry skin    on ears  . Gastroesophageal reflux   . Hearing loss    significant  uses hearing aids  . High blood pressure   . Hyperlipidemia   . Loss of appetite   . Loss of smell   . Macular degeneration   . Malnutrition ([place])   . Osteoporosis   . Persistent dry cough   . Skin cancer    basal / squamous   History reviewed. No pertinent surgical history. Social History:   reports that she has  never smoked. She has never used smokeless tobacco. She reports that she does not drink alcohol or use drugs.  Family History  Problem Relation Age of Onset  . Atrial fibrillation Sister   . Diabetes Sister   . Osteoarthritis Sister   . Osteopenia Sister   . Mental retardation Sister     Medications: Patient's Medications  New Prescriptions   No medications on file  Previous Medications   ACETAMINOPHEN (TYLENOL) 325 MG TABLET    Take 650 mg by mouth 2 (two) times a day. And every 6 hours as needed with Tramadol   ALPRAZOLAM (XANAX) 0.25 MG TABLET    Take 0.25 mg by mouth every 6 (six) hours as needed for anxiety.   BUSPIRONE (BUSPAR) 10 MG TABLET    Take 10 mg by mouth 3 (three) times daily.   CALCIUM CARBONATE (OS-CAL) 600 MG TABS TABLET    Take 600 mg by mouth every 12 (twelve) hours.    CHOLECALCIFEROL (VITAMIN D) 25 MCG (1000 UT) TABLET    Take 2,000 Units by mouth daily.   DICLOFENAC SODIUM (VOLTAREN) 1 % GEL    Apply 1 g topically 4 (four) times daily. Apply to right hip   DOCUSATE SODIUM (COLACE) 100 MG CAPSULE    Take 100 mg by mouth every  12 (twelve) hours as needed for mild constipation.   FAMOTIDINE (PEPCID) 20 MG TABLET    Take 20 mg by mouth 2 (two) times daily.   LOPERAMIDE (ANTI-DIARRHEAL) 2 MG TABLET    Take 4 mg by mouth as needed for diarrhea or loose stools (After loose stool).   METOPROLOL SUCCINATE (TOPROL-XL) 25 MG 24 HR TABLET    Take 25 mg by mouth daily.    MULTIPLE VITAMIN (DAILY VITE) TABS    Take 1 tablet by mouth daily.   MULTIPLE VITAMINS-MINERALS (EQ VISION FORMULA 50+ PO)    Take 1 tablet by mouth every 12 (twelve) hours.   SERTRALINE (ZOLOFT) 50 MG TABLET    Take 50 mg by mouth daily.   SIMVASTATIN (ZOCOR) 20 MG TABLET    Take 20 mg by mouth daily.   TRAMADOL (ULTRAM) 50 MG TABLET    Take 50 mg by mouth 3 (three) times daily.    TRIAMTERENE-HYDROCHLOROTHIAZIDE (MAXZIDE-25) 37.5-25 MG TABLET    Take 0.5 tablets by mouth daily.   Modified Medications    No medications on file  Discontinued Medications   DOXYCYCLINE (VIBRA-TABS) 100 MG TABLET    Take 1 tablet (100 mg total) by mouth 2 (two) times daily.    Physical Exam:  Vitals:   09/27/18 1308  BP: (!) 108/50  Pulse: 66  Temp: 98.7 F (37.1 C)  TempSrc: Oral  SpO2: 97%  Weight: 108 lb (49 kg)  Height: 5\' 3"  (1.6 m)   Body mass index is 19.13 kg/m. Wt Readings from Last 3 Encounters:  09/27/18 108 lb (49 kg)  09/06/18 104 lb 9.6 oz (47.4 kg)    Physical Exam Constitutional:      General: She is not in acute distress.    Appearance: She is underweight. She is not diaphoretic.  HENT:     Head: Normocephalic.     Comments: Bruising in different stages to left side of face significantly improved    Right Ear: Ear canal normal.     Left Ear: Ear canal normal.     Mouth/Throat:     Pharynx: No oropharyngeal exudate.  Eyes:     Conjunctiva/sclera: Conjunctivae normal.     Pupils: Pupils are equal, round, and reactive to light.  Neck:     Musculoskeletal: Normal range of motion and neck supple.  Cardiovascular:     Rate and Rhythm: Normal rate and regular rhythm.     Heart sounds: Normal heart sounds.  Pulmonary:     Effort: Pulmonary effort is normal.     Breath sounds: Normal breath sounds.  Abdominal:     General: Bowel sounds are normal.     Palpations: Abdomen is soft.  Musculoskeletal:        General: No tenderness.  Skin:    General: Skin is warm and dry.     Comments: Large skin tear to back of hand mostly healed, small open area without redness surrounding area . Abrasions to fingers have healed.   Neurological:     Mental Status: She is alert.  Psychiatric:        Cognition and Memory: Cognition is impaired. Memory is impaired.     Labs reviewed: Basic Metabolic Panel: Recent Labs    09/06/18 1433 09/16/18 2337  NA 134* 135  K 4.2 3.8  CL 96* 97*  CO2 28 25  GLUCOSE 85 92  BUN 13 15  CREATININE 0.61 0.62  CALCIUM 9.2 8.8*  TSH 0.38*  --  Liver Function Tests: Recent Labs    09/06/18 1433 09/16/18 2337  AST 27 54*  ALT 26 28  ALKPHOS  --  81  BILITOT 1.0 1.4*  PROT 5.7* 5.9*  ALBUMIN  --  3.7   No results for input(s): LIPASE, AMYLASE in the last 8760 hours. No results for input(s): AMMONIA in the last 8760 hours. CBC: Recent Labs    09/06/18 1433 09/16/18 2337  WBC 7.7 10.3  NEUTROABS 5,613 7.8*  HGB 13.0 12.5  HCT 40.4 39.6  MCV 83.6 84.4  PLT 233 193   Lipid Panel: Recent Labs    09/06/18 1433  CHOL 116  HDL 55  LDLCALC 43  TRIG 101  CHOLHDL 2.1   TSH: Recent Labs    09/06/18 1433  TSH 0.38*   A1C: No results found for: HGBA1C   Assessment/Plan 1. Dementia with behavioral disturbance, unspecified dementia type ([place]) - progressive worsening of memory and increase in anxiety.  -will start namenda titration at this time.  -caregiver [name] able to come into assisted living to help with her at this time which has been beneficial.  - Memantine HCl ER (NAMENDA XR TITRATION PACK) 7 & 14 & 21 &28 MG CP24; Take 7 mg by mouth daily for 7 days, THEN 14 mg daily for 7 days, THEN 21 mg daily for 7 days, THEN 28 mg daily for 7 days.  Dispense: 28 capsule; Refill: 0 - memantine (NAMENDA XR) 28 MG CP24 24 hr capsule; Take 1 capsule (28 mg total) by mouth daily.  Dispense: 30 capsule; Refill: 3  2. Essential hypertension - blood pressure low at this time, will stop triamterene-hctz as she is also at risk for dehydration due to poor intake.  -continues on lopressor daily and will have nursing staff monitor blood pressure   3. Paroxysmal atrial fibrillation (HCC) -rate controlled, not on anticoagulation due to frequent falls with hx of subdural hematoma  4. Osteoarthritis, unspecified osteoarthritis type, unspecified site Ongoing   5. Weight loss -has started ensure, weight is up since last visit, caregiver helping with meals  6. Infected skin tear -no longer with infection at this time, skin  tear healing, to continue xeroform dressing until completely resolved.   7. Anxiety -zoloft and buspar have helped, will continue at this time. Hopefully with addition of namenda this will also benefit mood.  Next appt: 8 weeks  Shanay Woolman K. [place], [facility] [phone]

## 2018-09-27 NOTE — Patient Instructions (Addendum)
STOP triamterene-hctz To monitor blood pressure twice weekly and notify if blood pressure <140/90  To start namenda titration pack as directed. Once titration complete to start namenda 28 mg by mouth daily   To continue xeroform dressing to left hand until wound complete heals.    Follow up in 8 weeks on blood pressure, wound, mood.

## 2018-09-28 ENCOUNTER — Ambulatory Visit: Payer: Medicare Other | Admitting: Nurse Practitioner

## 2018-09-28 ENCOUNTER — Ambulatory Visit: Payer: Self-pay | Admitting: Nurse Practitioner

## 2018-10-04 ENCOUNTER — Telehealth: Payer: Self-pay

## 2018-10-04 NOTE — Telephone Encounter (Signed)
[name] notified of provider recommendations and states patient has been eating good and drinking ensure, but will follow the recommendations of the provider.

## 2018-10-04 NOTE — Telephone Encounter (Signed)
Patient blood pressure 138/79 pulse 80

## 2018-10-04 NOTE — Telephone Encounter (Signed)
Care taker [name] called and states the patient was seen in the office on 7/23 for leg swelling but [name] stated she had to step out the room and was told this issue was never addressed.  [name] states the symptoms started a few weeks ago and that she did buy the patient compression socks and is having patient to stay off of her feet.  Please advise

## 2018-10-04 NOTE — Telephone Encounter (Signed)
Leg swelling could be due to nutritional deficit as well as venous insufficiency. Elevation of the legs above the level of the heart when sitting will be very beneficial, Compression hose during the day and working on nutritional status.

## 2018-10-08 ENCOUNTER — Telehealth: Payer: Self-pay | Admitting: *Deleted

## 2018-10-08 NOTE — Telephone Encounter (Signed)
To apply compression hose first thing in the morning and then to remove at bedtime daily

## 2018-10-08 NOTE — Telephone Encounter (Signed)
[name], Caregiver called and stated that the facility is requesting an order to apply Compression socks. When you want them applied and taken off. Needs faxed to [facility].  Please Advise.

## 2018-10-08 NOTE — Telephone Encounter (Signed)
Printed and faxed to [employer] [facility]

## 2018-10-10 ENCOUNTER — Encounter: Payer: Self-pay | Admitting: Nurse Practitioner

## 2018-10-10 ENCOUNTER — Ambulatory Visit (INDEPENDENT_AMBULATORY_CARE_PROVIDER_SITE_OTHER): Payer: Medicare Other | Admitting: Nurse Practitioner

## 2018-10-10 ENCOUNTER — Other Ambulatory Visit: Payer: Self-pay

## 2018-10-10 VITALS — BP 122/78 | HR 68 | Temp 98.6°F | Resp 10 | Ht 63.0 in | Wt 112.0 lb

## 2018-10-10 DIAGNOSIS — R600 Localized edema: Secondary | ICD-10-CM | POA: Diagnosis not present

## 2018-10-10 MED ORDER — HYDROCHLOROTHIAZIDE 25 MG PO TABS: 25.0000 mg | ORAL_TABLET | Freq: Every day | ORAL | 3 refills | Status: DC

## 2018-10-10 NOTE — Patient Instructions (Addendum)
Continue on ensure daily and increasing good calories and protein in diet  Start HCTZ (hydrochlorothiazide) 25 mg by mouth daily in the morning for swelling To use compression hose - place on her first thing in the morning Remove compression hose at bedtime  Make sure she elevates legs ABOVE level of the heart daily in the afternoon to reduce swelling  Follow up in 1 week

## 2018-10-10 NOTE — Progress Notes (Signed)
Careteam: Patient Care Team: [name], NP as PCP - General (Geriatric Medicine) Jolene Schimke, MD as Referring Physician (Dermatology) Birder Robson, MD as Referring Physician (Ophthalmology) Jaymes Graff, DO as Consulting Physician (Orthopedic Surgery)  Advanced Directive information    No Known Allergies  Chief Complaint  Patient presents with  . Acute Visit    Ongoing bilateral leg swelling. High fall risk. Here with cargiver [name]      HPI: Patient is a 85 y.o. female seen in the office today due to ongoing lower leg swelling.  At last office visit pt noted to have low blood pressure and with poor oral intake diuretic (triamterene-hctz) was stopped. Now with leg swelling despite compression and elevation.  Caregiver states they are not always putting compression hose on at facility an this morning legs were very swollen.  Today caregiver was there and put hose on and made sure legs were elevated and swelling has already improved.  No chest pains or shortness of breath. Some weeping noted. No redness or warmth noted. Pt denies pain. Review of Systems:  Review of Systems  Unable to perform ROS: Dementia    Past Medical History:  Diagnosis Date  . A-fib ([place])   . Anemia   . Anxiety   . Dry skin    on ears  . Gastroesophageal reflux   . Hearing loss    significant  uses hearing aids  . High blood pressure   . Hyperlipidemia   . Loss of appetite   . Loss of smell   . Macular degeneration   . Malnutrition ([place])   . Osteoporosis   . Persistent dry cough   . Skin cancer    basal / squamous  . Slow transit constipation    abstracted from new patient packet   . Vitamin D deficiency 01/26/2018   History reviewed. No pertinent surgical history. Social History:   reports that she has never smoked. She has never used smokeless tobacco. She reports that she does not drink alcohol or use drugs.  Family History  Problem Relation Age of Onset  .  Atrial fibrillation Sister   . Diabetes Sister   . Osteoarthritis Sister   . Osteopenia Sister   . Mental retardation Sister     Medications: Patient's Medications  New Prescriptions   No medications on file  Previous Medications   ACETAMINOPHEN (TYLENOL) 325 MG TABLET    Take 650 mg by mouth 2 (two) times daily. And every 6 hours as needed with Tramadol   ALPRAZOLAM (XANAX) 0.25 MG TABLET    Take 0.25 mg by mouth every 6 (six) hours as needed for anxiety.   BUSPIRONE (BUSPAR) 10 MG TABLET    Take 10 mg by mouth 3 (three) times daily.   CALCIUM CARBONATE (OS-CAL) 600 MG TABS TABLET    Take 600 mg by mouth every 12 (twelve) hours.    CHOLECALCIFEROL (VITAMIN D) 25 MCG (1000 UT) TABLET    Take 2,000 Units by mouth daily.   DICLOFENAC SODIUM (VOLTAREN) 1 % GEL    Apply 1 g topically. 3-4 times daily   DOCUSATE SODIUM (COLACE) 100 MG CAPSULE    Take 100 mg by mouth every 12 (twelve) hours as needed for mild constipation.   ENSURE (ENSURE)    Take 237 mLs by mouth daily.   FAMOTIDINE (PEPCID) 20 MG TABLET    Take 20 mg by mouth at bedtime.   LOPERAMIDE (IMODIUM) 2 MG CAPSULE  Take 2 mg by mouth as needed for diarrhea or loose stools.   MEMANTINE HCL ER (NAMENDA XR TITRATION PACK) 7 & 14 & 21 &28 MG CP24    Take 7 mg by mouth daily for 7 days, THEN 14 mg daily for 7 days, THEN 21 mg daily for 7 days, THEN 28 mg daily for 7 days.   METOPROLOL SUCCINATE (TOPROL-XL) 25 MG 24 HR TABLET    Take 25 mg by mouth daily.    MULTIPLE VITAMIN (DAILY VITE) TABS    Take 1 tablet by mouth daily.   MULTIPLE VITAMINS-MINERALS (EQ VISION FORMULA 50+ PO)    Take 1 tablet by mouth every 12 (twelve) hours.   NYSTATIN OINTMENT (MYCOSTATIN)    Apply 1 application topically 3 (three) times daily. Use on back   POLYETHYL GLYCOL-PROPYL GLYCOL (SYSTANE) 0.4-0.3 % GEL OPHTHALMIC GEL    Place 1 application into both eyes as needed. 2 drops into affected eyes every hour for dry eyes/itching   SACCHAROMYCES BOULARDII  (FLORASTOR) 250 MG CAPSULE    Take 250 mg by mouth 2 (two) times daily.   SERTRALINE (ZOLOFT) 50 MG TABLET    Take 50 mg by mouth daily.   SIMVASTATIN (ZOCOR) 20 MG TABLET    Take 20 mg by mouth daily.   TRAMADOL (ULTRAM) 50 MG TABLET    Take 50 mg by mouth 3 (three) times daily.   Modified Medications   No medications on file  Discontinued Medications   MULTIPLE VITAMINS-MINERALS (EQ VISION FORMULA 50+ PO)    Take 1 tablet by mouth every 12 (twelve) hours.   OMEPRAZOLE (PRILOSEC) 20 MG CAPSULE    Take 20 mg by mouth daily.   SIMVASTATIN (ZOCOR) 40 MG TABLET    Take 40 mg by mouth daily. In the pm    Physical Exam:  Vitals:   10/10/18 1440  BP: 122/78  Pulse: 68  Resp: 10  Temp: 98.6 F (37 C)  TempSrc: Oral  SpO2: 95%  Weight: 112 lb (50.8 kg)  Height: 5\' 3"  (1.6 m)   Body mass index is 19.84 kg/m. Wt Readings from Last 3 Encounters:  10/10/18 112 lb (50.8 kg)  09/27/18 108 lb (49 kg)  09/06/18 104 lb 9.6 oz (47.4 kg)    Physical Exam Constitutional:      General: She is not in acute distress.    Appearance: She is well-developed. She is not diaphoretic.  HENT:     Head: Normocephalic and atraumatic.     Mouth/Throat:     Pharynx: No oropharyngeal exudate.  Eyes:     Conjunctiva/sclera: Conjunctivae normal.     Pupils: Pupils are equal, round, and reactive to light.  Neck:     Musculoskeletal: Normal range of motion and neck supple.  Cardiovascular:     Rate and Rhythm: Normal rate and regular rhythm.     Heart sounds: Normal heart sounds.  Pulmonary:     Effort: Pulmonary effort is normal.     Breath sounds: Normal breath sounds.  Abdominal:     General: Bowel sounds are normal.     Palpations: Abdomen is soft.  Musculoskeletal:        General: No tenderness.     Right lower leg: Edema (2+) present.     Left lower leg: Edema (2+) present.     Comments: Frail thin skin noted, without breakdown, Redness or heat  Skin:    General: Skin is warm and dry.   Neurological:  Mental Status: She is alert and oriented to person, place, and time.     Labs reviewed: Basic Metabolic Panel: Recent Labs    09/06/18 1433 09/16/18 2337  NA 134* 135  K 4.2 3.8  CL 96* 97*  CO2 28 25  GLUCOSE 85 92  BUN 13 15  CREATININE 0.61 0.62  CALCIUM 9.2 8.8*  TSH 0.38*  --    Liver Function Tests: Recent Labs    09/06/18 1433 09/16/18 2337  AST 27 54*  ALT 26 28  ALKPHOS  --  81  BILITOT 1.0 1.4*  PROT 5.7* 5.9*  ALBUMIN  --  3.7   No results for input(s): LIPASE, AMYLASE in the last 8760 hours. No results for input(s): AMMONIA in the last 8760 hours. CBC: Recent Labs    09/06/18 1433 09/16/18 2337  WBC 7.7 10.3  NEUTROABS 5,613 7.8*  HGB 13.0 12.5  HCT 40.4 39.6  MCV 83.6 84.4  PLT 233 193   Lipid Panel: Recent Labs    09/06/18 1433  CHOL 116  HDL 55  LDLCALC 43  TRIG 101  CHOLHDL 2.1   TSH: Recent Labs    09/06/18 1433  TSH 0.38*   A1C: No results found for: HGBA1C   Assessment/Plan 1. Bilateral lower extremity edema -Start HCTZ (hydrochlorothiazide) 25 mg by mouth daily in the morning for swelling To use compression hose - place on her first thing in the morning Remove compression hose at bedtime Make sure she elevates legs ABOVE level of the heart daily in the afternoon to reduce swelling - hydrochlorothiazide (HYDRODIURIL) 25 MG tablet; Take 1 tablet (25 mg total) by mouth daily.  Dispense: 90 tablet; Refill: 3  Next appt: 1 week [facility] [place], [facility] [phone]

## 2018-10-15 ENCOUNTER — Ambulatory Visit (INDEPENDENT_AMBULATORY_CARE_PROVIDER_SITE_OTHER): Payer: Medicare Other | Admitting: Family

## 2018-10-15 ENCOUNTER — Other Ambulatory Visit: Payer: Self-pay

## 2018-10-15 ENCOUNTER — Encounter: Payer: Self-pay | Admitting: Family

## 2018-10-15 VITALS — BP 128/70 | HR 68 | Temp 98.2°F | Ht 63.0 in | Wt 113.8 lb

## 2018-10-15 DIAGNOSIS — R600 Localized edema: Secondary | ICD-10-CM | POA: Diagnosis not present

## 2018-10-15 DIAGNOSIS — S81802A Unspecified open wound, left lower leg, initial encounter: Secondary | ICD-10-CM

## 2018-10-15 MED ORDER — POTASSIUM CHLORIDE CRYS ER 10 MEQ PO TBCR: 20.0000 meq | EXTENDED_RELEASE_TABLET | Freq: Every day | ORAL | 0 refills | Status: DC

## 2018-10-15 MED ORDER — TORSEMIDE 10 MG PO TABS: 10.0000 mg | ORAL_TABLET | Freq: Every day | ORAL | 0 refills | Status: DC

## 2018-10-15 NOTE — Progress Notes (Signed)
Provider: Arron Tetrault FNP-C  [name], NP  Patient Care Team: [name], NP as PCP - General (Geriatric Medicine) Jolene Schimke, MD as Referring Physician (Dermatology) Birder Robson, MD as Referring Physician (Ophthalmology) Jaymes Graff, DO as Consulting Physician (Orthopedic Surgery)  Extended Emergency Contact Information Primary Emergency Contact: Corwin Levins Mobile Phone: [phone] Relation: Niece Secondary Emergency Contact: Davis,Carol Mobile Phone: [phone] Relation: Niece  Code Status:  Goals of care: Advanced Directive information Advanced Directives 09/16/2018  Does Patient Have a Medical Advance Directive? Yes  Type of Advance Directive Out of facility DNR (pink MOST or yellow form)  Pre-existing out of facility DNR order (yellow form or pink MOST form) Yellow form placed in chart (order not valid for inpatient use)     Chief Complaint  Patient presents with   Acute Visit    Abnormal spot and drainage on left leg was here last week for swelling     HPI:  Pt is a 85 y.o. female seen today for an acute visit for evaluation of bilateral leg edema and left leg wound.she is here with her sitter.she resides in [facility] assisted living.she was here 10/10/2018 by Sherrie Mustache NP for leg edema. She was started on Hydrochlorothiazide 25 mg Tablet daily for edema.Care giver states swelling on patient's legs has worsen now draining and has a wound on left leg.she denies any cough,shortness of breath or wheezing.also denies any fever,chills or pain on left leg.Of note,she is very hard of hearing but answers question appropriate when she hears.Care giver provided additional information.     Past Medical History:  Diagnosis Date   A-fib ([place])    Anemia    Anxiety    Dry skin    on ears   Gastroesophageal reflux    Hearing loss    significant  uses hearing aids   High blood pressure    Hyperlipidemia    Loss of  appetite    Loss of smell    Macular degeneration    Malnutrition ([facility])    Osteoporosis    Persistent dry cough    Skin cancer    basal / squamous   Slow transit constipation    abstracted from new patient packet    Vitamin D deficiency 01/26/2018   History reviewed. No pertinent surgical history.  No Known Allergies  Outpatient Encounter Medications as of 10/15/2018  Medication Sig   acetaminophen (TYLENOL) 325 MG tablet Take 650 mg by mouth 2 (two) times daily. And every 6 hours as needed with Tramadol   ALPRAZolam (XANAX) 0.25 MG tablet Take 0.25 mg by mouth every 6 (six) hours as needed for anxiety.   busPIRone (BUSPAR) 10 MG tablet Take 10 mg by mouth 3 (three) times daily.   calcium carbonate (OS-CAL) 600 MG TABS tablet Take 600 mg by mouth every 12 (twelve) hours.    cholecalciferol (VITAMIN D) 25 MCG (1000 UT) tablet Take 2,000 Units by mouth daily.   diclofenac sodium (VOLTAREN) 1 % GEL Apply 1 g topically. 3-4 times daily   docusate sodium (COLACE) 100 MG capsule Take 100 mg by mouth every 12 (twelve) hours as needed for mild constipation.   Ensure (ENSURE) Take 237 mLs by mouth daily.   famotidine (PEPCID) 20 MG tablet Take 20 mg by mouth at bedtime.   hydrochlorothiazide (HYDRODIURIL) 25 MG tablet Take 1 tablet (25 mg total) by mouth daily.   loperamide (IMODIUM) 2 MG capsule Take 2 mg by mouth as needed for  diarrhea or loose stools.   Memantine HCl ER (NAMENDA XR TITRATION PACK) 7 & 14 & 21 &28 MG CP24 Take 7 mg by mouth daily for 7 days, THEN 14 mg daily for 7 days, THEN 21 mg daily for 7 days, THEN 28 mg daily for 7 days.   metoprolol succinate (TOPROL-XL) 25 MG 24 hr tablet Take 25 mg by mouth daily.    Multiple Vitamin (DAILY VITE) TABS Take 1 tablet by mouth daily.   Multiple Vitamins-Minerals (EQ VISION FORMULA 50+ PO) Take 1 tablet by mouth every 12 (twelve) hours.   nystatin ointment (MYCOSTATIN) Apply 1 application topically 3 (three)  times daily. Use on back   Polyethyl Glycol-Propyl Glycol (SYSTANE) 0.4-0.3 % GEL ophthalmic gel Place 1 application into both eyes as needed. 2 drops into affected eyes every hour for dry eyes/itching   saccharomyces boulardii (FLORASTOR) 250 MG capsule Take 250 mg by mouth 2 (two) times daily.   sertraline (ZOLOFT) 50 MG tablet Take 50 mg by mouth daily.   simvastatin (ZOCOR) 20 MG tablet Take 20 mg by mouth daily.   traMADol (ULTRAM) 50 MG tablet Take 50 mg by mouth 3 (three) times daily.    No facility-administered encounter medications on file as of 10/15/2018.     Review of Systems  Unable to perform ROS: Other (additional information provided by patient's sitter )  Constitutional: Negative for chills, fatigue and fever.  HENT: Positive for hearing loss. Negative for congestion, rhinorrhea, sinus pressure, sinus pain, sneezing, sore throat and tinnitus.   Respiratory: Negative for cough, chest tightness, shortness of breath and wheezing.   Cardiovascular: Positive for leg swelling. Negative for chest pain and palpitations.  Gastrointestinal: Negative for abdominal distention, abdominal pain, constipation, diarrhea, nausea and vomiting.  Musculoskeletal: Positive for gait problem.  Skin: Positive for wound. Negative for color change, pallor and rash.       Left leg wound   Neurological: Negative for dizziness, light-headedness and headaches.  Psychiatric/Behavioral: Negative for agitation, confusion and sleep disturbance. The patient is not nervous/anxious.     Immunization History  Administered Date(s) Administered   Tdap 08/29/2018   Pertinent  Health Maintenance Due  Topic Date Due   DEXA SCAN  03/13/1992   PNA vac Low Risk Adult (1 of 2 - PCV13) 03/13/1992   INFLUENZA VACCINE  10/06/2018   Fall Risk  10/15/2018 10/10/2018 09/27/2018 09/06/2018  Falls in the past year? 0 1 1 1   Number falls in past yr: 0 1 1 1   Injury with Fall? 0 1 1 1   Risk for fall due to : -  History of fall(s);Impaired balance/gait History of fall(s);Impaired balance/gait;Impaired mobility;Impaired vision -    Vitals:   10/15/18 1522  BP: 128/70  Pulse: 68  Temp: 98.2 F (36.8 C)  TempSrc: Oral  SpO2: 97%  Weight: 113 lb 12.8 oz (51.6 kg)  Height: 5\' 3"  (1.6 m)   Body mass index is 20.16 kg/m. Physical Exam Vitals signs reviewed.  Constitutional:      General: She is not in acute distress.    Appearance: She is normal weight. She is not ill-appearing.  Eyes:     General: No scleral icterus.       Right eye: No discharge.        Left eye: No discharge.     Conjunctiva/sclera: Conjunctivae normal.     Pupils: Pupils are equal, round, and reactive to light.  Cardiovascular:     Rate and Rhythm: Normal rate  and regular rhythm.     Pulses: Normal pulses.     Heart sounds: Normal heart sounds. No murmur. No friction rub. No gallop.   Pulmonary:     Effort: Pulmonary effort is normal. No respiratory distress.     Breath sounds: No wheezing, rhonchi or rales.  Chest:     Chest wall: No tenderness.  Abdominal:     General: Bowel sounds are normal. There is no distension.     Palpations: Abdomen is soft. There is no mass.     Tenderness: There is no abdominal tenderness. There is no right CVA tenderness, left CVA tenderness, guarding or rebound.  Musculoskeletal:        General: No tenderness.     Right lower leg: Edema present.     Left lower leg: Edema present.     Comments: Unsteady gait walks with front wheel walker.bilateral lower extremities 2-3 + edema.   Skin:    General: Skin is warm and dry.     Coloration: Skin is not pale.     Findings: No bruising, erythema or rash.     Comments: Left lower leg quarter size open wound draining large amounts of serous drainage.Patient's pants and socks saturated with drainage.wound bed red in color.surrounding skin tissue without any redness or warm to touch.leg edema noted.  Wound cleansed with saline,pat dry,triple  antibiotic ointment applied and covered with foam dressing for extra absorption.  Neurological:     Mental Status: She is alert. Mental status is at baseline.     Cranial Nerves: No cranial nerve deficit.     Sensory: No sensory deficit.     Motor: No weakness.     Gait: Gait abnormal.     Comments: HOH   Psychiatric:        Mood and Affect: Mood normal.        Behavior: Behavior normal.        Thought Content: Thought content normal.        Judgment: Judgment normal.    Labs reviewed: Recent Labs    09/06/18 1433 09/16/18 2337  NA 134* 135  K 4.2 3.8  CL 96* 97*  CO2 28 25  GLUCOSE 85 92  BUN 13 15  CREATININE 0.61 0.62  CALCIUM 9.2 8.8*   Recent Labs    09/06/18 1433 09/16/18 2337  AST 27 54*  ALT 26 28  ALKPHOS  --  81  BILITOT 1.0 1.4*  PROT 5.7* 5.9*  ALBUMIN  --  3.7   Recent Labs    09/06/18 1433 09/16/18 2337  WBC 7.7 10.3  NEUTROABS 5,613 7.8*  HGB 13.0 12.5  HCT 40.4 39.6  MCV 83.6 84.4  PLT 233 193   Lab Results  Component Value Date   TSH 0.38 (L) 09/06/2018   No results found for: HGBA1C Lab Results  Component Value Date   CHOL 116 09/06/2018   HDL 55 09/06/2018   LDLCALC 43 09/06/2018   TRIG 101 09/06/2018   CHOLHDL 2.1 09/06/2018    Significant Diagnostic Results in last 30 days:  Ct Head Wo Contrast  Result Date: 09/17/2018 CLINICAL DATA:  85 y/o  F; unwitnessed fall. EXAM: CT HEAD WITHOUT CONTRAST CT CERVICAL SPINE WITHOUT CONTRAST TECHNIQUE: Multidetector CT imaging of the head and cervical spine was performed following the standard protocol without intravenous contrast. Multiplanar CT image reconstructions of the cervical spine were also generated. COMPARISON:  08/29/2018 CT of head and cervical spine. FINDINGS: CT HEAD FINDINGS  Brain: No evidence of acute infarction, hemorrhage, hydrocephalus, extra-axial collection or mass lesion/mass effect. Stable small chronic infarction within the left inferolateral frontal lobe. Stable very  small chronic infarctions in left anterior limb of internal capsule and the left cerebellum. Stable nonspecific white matter hypodensities compatible with chronic microvascular ischemic changes. Stable volume loss of the brain. Vascular: Calcific atherosclerosis of the internal carotid arteries and vertebral arteries. Skull: Normal. Negative for fracture or focal lesion. Sinuses/Orbits: No acute finding. Other: Bilateral intra-ocular lens replacement. CT CERVICAL SPINE FINDINGS Alignment: Stable C7-T1 grade 1 anterolisthesis. Mild cervical levocurvature. Skull base and vertebrae: No acute fracture. No primary bone lesion or focal pathologic process. Soft tissues and spinal canal: No prevertebral fluid or swelling. No visible canal hematoma. Disc levels: Multilevel discogenic degenerative changes with loss of intervertebral disc space height greatest at the C4-C6 levels. Right greater than left facet arthropathy. Uncovertebral and facet hypertrophy encroach on the neural foramen at the right C2-3, right C3-4, right C4-5, left C5-6, left C6-7 levels. Upper chest: Stable thyroid nodules measuring up to 15 mm in the right lobe. Other: Mild biapical pleuroparenchymal scarring. IMPRESSION: 1. No acute intracranial abnormality or calvarial fracture. 2. No acute fracture or dislocation of cervical spine. 3. Stable small chronic infarcts, chronic microvascular ischemic changes, and volume loss of the brain. 4. Stable moderate cervical spondylosis greatest at the C4-C6 levels. Electronically Signed   By: Kristine Garbe M.D.   On: 09/17/2018 01:40   Ct Cervical Spine Wo Contrast  Result Date: 09/17/2018 CLINICAL DATA:  85 y/o  F; unwitnessed fall. EXAM: CT HEAD WITHOUT CONTRAST CT CERVICAL SPINE WITHOUT CONTRAST TECHNIQUE: Multidetector CT imaging of the head and cervical spine was performed following the standard protocol without intravenous contrast. Multiplanar CT image reconstructions of the cervical spine  were also generated. COMPARISON:  08/29/2018 CT of head and cervical spine. FINDINGS: CT HEAD FINDINGS Brain: No evidence of acute infarction, hemorrhage, hydrocephalus, extra-axial collection or mass lesion/mass effect. Stable small chronic infarction within the left inferolateral frontal lobe. Stable very small chronic infarctions in left anterior limb of internal capsule and the left cerebellum. Stable nonspecific white matter hypodensities compatible with chronic microvascular ischemic changes. Stable volume loss of the brain. Vascular: Calcific atherosclerosis of the internal carotid arteries and vertebral arteries. Skull: Normal. Negative for fracture or focal lesion. Sinuses/Orbits: No acute finding. Other: Bilateral intra-ocular lens replacement. CT CERVICAL SPINE FINDINGS Alignment: Stable C7-T1 grade 1 anterolisthesis. Mild cervical levocurvature. Skull base and vertebrae: No acute fracture. No primary bone lesion or focal pathologic process. Soft tissues and spinal canal: No prevertebral fluid or swelling. No visible canal hematoma. Disc levels: Multilevel discogenic degenerative changes with loss of intervertebral disc space height greatest at the C4-C6 levels. Right greater than left facet arthropathy. Uncovertebral and facet hypertrophy encroach on the neural foramen at the right C2-3, right C3-4, right C4-5, left C5-6, left C6-7 levels. Upper chest: Stable thyroid nodules measuring up to 15 mm in the right lobe. Other: Mild biapical pleuroparenchymal scarring. IMPRESSION: 1. No acute intracranial abnormality or calvarial fracture. 2. No acute fracture or dislocation of cervical spine. 3. Stable small chronic infarcts, chronic microvascular ischemic changes, and volume loss of the brain. 4. Stable moderate cervical spondylosis greatest at the C4-C6 levels. Electronically Signed   By: Kristine Garbe M.D.   On: 09/17/2018 01:40   Dg Hand Complete Left  Result Date: 09/17/2018 CLINICAL DATA:   Fall with hand pain and abrasion EXAM: LEFT HAND - COMPLETE 3+ VIEW  COMPARISON:  08/29/2018 FINDINGS: Bones appear osteopenic, this limits evaluation for fracture. No definitive acute displaced fracture or subluxation. Scattered cysts within the carpal bones. Moderate arthritis at the first [facility] joint and STT interval. Degenerative narrowing of the second through fifth DIP and PIP joints. IMPRESSION: 1. No definite acute osseous abnormality 2. Arthritis of the hand and wrist Electronically Signed   By: Donavan Foil M.D.   On: 09/17/2018 00:17    Assessment/Plan 1. Bilateral lower extremity edema 2-3+ edema with weeping noted.Start on Torsemide 10 mg tablet one by mouth daily. Hold if SBP < 110. Potassium chloride 10 meq Tablet take one by mouth daily along with Torsemide. Medication send to pharmacy.Orders also written for facility visit order sheet.   2. Open wound of left lower extremity without complication, initial encounter Left lower leg quarter size open wound draining large amounts of serous drainage.surrounding skin tissue without any redness or warm to touch.Wound cleansed with saline,pat dry,triple antibiotic ointment applied and covered with foam dressing for extra absorption.Cleanse left leg wound with saline,pat dry,cover with xerofoam,4 X 4 gauze and ABD pad and secure with paper tape.change dressing every other day. Wound care orders written given to care giver to take to facility Nurse.Patient has up coming appointment with PCP will follow up wound 10/17/2018.   Family/ staff Communication: Reviewed plan of care with patient.  Labs/tests ordered: Has ordered labs to be drawn at the facility  Next appt: 10/17/2018 with Sherrie Mustache NP   Time spent with patient 25 minutes >50% time spent counseling; reviewing medical record; tests; labs; changing dressing on left leg and developing future plan of care   Sandrea Hughs, NP

## 2018-10-15 NOTE — Patient Instructions (Addendum)
1. Cleanse left leg wound with saline,pat dry,cover with xerofoam,4 X 4 gauze and ABD pad and secure with paper tape.change dressing every other day.   2. Start on Torsemide 10 mg tablet one by mouth daily. Hold if SBP < 110   3. Potassium chloride 10 meq Tablet take one by mouth daily along with Torsemide.   4. Follow up in 2 days as scheduled with Jasmine Mustache NP for evaluation wound and edema.

## 2018-10-16 ENCOUNTER — Telehealth: Payer: Self-pay

## 2018-10-16 NOTE — Telephone Encounter (Signed)
[name] Notified and agreed.

## 2018-10-16 NOTE — Telephone Encounter (Signed)
Continue with compression stockings to right leg may leave off left leg if unable to pull over the dressing.

## 2018-10-16 NOTE — Telephone Encounter (Signed)
Jasmine Jones, patients caregiver called to ask if patient is to continue wearing compression stockings. Patient seen [name], NP yesterday for sores and drainage on leg.   Please advise

## 2018-10-17 ENCOUNTER — Other Ambulatory Visit: Payer: Self-pay

## 2018-10-17 ENCOUNTER — Encounter: Payer: Self-pay | Admitting: Nurse Practitioner

## 2018-10-17 ENCOUNTER — Ambulatory Visit (INDEPENDENT_AMBULATORY_CARE_PROVIDER_SITE_OTHER): Payer: Medicare Other | Admitting: Nurse Practitioner

## 2018-10-17 VITALS — BP 110/60 | HR 67 | Temp 98.5°F | Ht 63.0 in | Wt 110.0 lb

## 2018-10-17 DIAGNOSIS — R600 Localized edema: Secondary | ICD-10-CM

## 2018-10-17 DIAGNOSIS — F0391 Unspecified dementia with behavioral disturbance: Secondary | ICD-10-CM | POA: Diagnosis not present

## 2018-10-17 DIAGNOSIS — S81802D Unspecified open wound, left lower leg, subsequent encounter: Secondary | ICD-10-CM | POA: Diagnosis not present

## 2018-10-17 NOTE — Patient Instructions (Addendum)
Follow up on Monday 10/22/2018 for swelling and wound check  Try to coordinate with home health so we can look at leg and the wound To notify if area is getting larger,tissue become increasely red, or discolored discharge/drainage.

## 2018-10-17 NOTE — Progress Notes (Signed)
Careteam: Patient Care Team: [name], NP as PCP - General (Geriatric Medicine) Jolene Schimke, MD as Referring Physician (Dermatology) Birder Robson, MD as Referring Physician (Ophthalmology) Jaymes Graff, DO as Consulting Physician (Orthopedic Surgery)  Advanced Directive information    No Known Allergies  Chief Complaint  Patient presents with  . Follow-up    1 week follow-up on legs and swelling. Here with cargiver, [name]     HPI: Patient is a 85 y.o. female seen in the office today for follow up on leg swelling. Pt was seen 2 days ago due to worsening edema and there was a sore and she was brought in to see [place].  This morning caregiver reports she feels like her legs are MORE swollen.  She has lost 3 lbs and she feels like this is due to fluids.  Does not feel like facility is putting hose on her, if she says no they wont do it instead of encouraging her to wear them. Home health nursing now involved and wrapped legs yesterda morning Using xeroform dressing and plans to come back tomorrow to evaluate wound and rewrap leg. They are coming every other day at this time. Leg wraps are dry at thi stime  Prior to coming in 2 days ago she was up all night. Did not elevate legs.  Has taken 2 doses of torsemide 10 mg  Memory continues to decline. Thought her sister was with her but she was not. Did not remember getting her hair done.  Mood has been stable, laughing. She was started on namenda at the end of July, continues to titrate.  Worried about sundowners since she was up all night.   Review of Systems:  Review of Systems  Unable to perform ROS: Dementia    Past Medical History:  Diagnosis Date  . A-fib ([place])   . Anemia   . Anxiety   . Dry skin    on ears  . Gastroesophageal reflux   . Hearing loss    significant  uses hearing aids  . High blood pressure   . Hyperlipidemia   . Loss of appetite   . Loss of smell   . Macular degeneration   .  Malnutrition ([place])   . Osteoporosis   . Persistent dry cough   . Skin cancer    basal / squamous  . Slow transit constipation    abstracted from new patient packet   . Vitamin D deficiency 01/26/2018   History reviewed. No pertinent surgical history. Social History:   reports that she has never smoked. She has never used smokeless tobacco. She reports that she does not drink alcohol or use drugs.  Family History  Problem Relation Age of Onset  . Atrial fibrillation Sister   . Diabetes Sister   . Osteoarthritis Sister   . Osteopenia Sister   . Mental retardation Sister     Medications: Patient's Medications  New Prescriptions   No medications on file  Previous Medications   ACETAMINOPHEN (TYLENOL) 325 MG TABLET    Take 650 mg by mouth 2 (two) times daily. And every 6 hours as needed with Tramadol   ALPRAZOLAM (XANAX) 0.25 MG TABLET    Take 0.25 mg by mouth every 6 (six) hours as needed for anxiety.   BUSPIRONE (BUSPAR) 10 MG TABLET    Take 10 mg by mouth 3 (three) times daily.   CALCIUM CARBONATE (OS-CAL) 600 MG TABS TABLET    Take 600 mg by  mouth every 12 (twelve) hours.    CHOLECALCIFEROL (VITAMIN D) 25 MCG (1000 UT) TABLET    Take 2,000 Units by mouth daily.   DICLOFENAC SODIUM (VOLTAREN) 1 % GEL    Apply 1 g topically. 3-4 times daily   DOCUSATE SODIUM (COLACE) 100 MG CAPSULE    Take 100 mg by mouth every 12 (twelve) hours as needed for mild constipation.   ENSURE (ENSURE)    Take 237 mLs by mouth daily.   FAMOTIDINE (PEPCID) 20 MG TABLET    Take 20 mg by mouth at bedtime.   HYDROCHLOROTHIAZIDE (HYDRODIURIL) 25 MG TABLET    Take 1 tablet (25 mg total) by mouth daily.   LOPERAMIDE (IMODIUM) 2 MG CAPSULE    Take 2 mg by mouth as needed for diarrhea or loose stools.   MEMANTINE (NAMENDA XR) 21 MG CP24 24 HR CAPSULE    Take 1 capsule by mouth daily.   METOPROLOL SUCCINATE (TOPROL-XL) 25 MG 24 HR TABLET    Take 25 mg by mouth daily.    MULTIPLE VITAMIN (DAILY VITE) TABS    Take  1 tablet by mouth daily.   MULTIPLE VITAMINS-MINERALS (EQ VISION FORMULA 50+ PO)    Take 1 tablet by mouth every 12 (twelve) hours.   NYSTATIN OINTMENT (MYCOSTATIN)    Apply 1 application topically 3 (three) times daily. Use on back   POLYETHYL GLYCOL-PROPYL GLYCOL (SYSTANE) 0.4-0.3 % GEL OPHTHALMIC GEL    Place 1 application into both eyes as needed. 2 drops into affected eyes every hour for dry eyes/itching   POTASSIUM CHLORIDE (K-DUR) 10 MEQ TABLET    Take 2 tablets (20 mEq total) by mouth daily.   SACCHAROMYCES BOULARDII (FLORASTOR) 250 MG CAPSULE    Take 250 mg by mouth 2 (two) times daily.   SERTRALINE (ZOLOFT) 50 MG TABLET    Take 50 mg by mouth daily.   SIMVASTATIN (ZOCOR) 20 MG TABLET    Take 20 mg by mouth daily.   TORSEMIDE (DEMADEX) 10 MG TABLET    Take 1 tablet (10 mg total) by mouth daily. Hold if SBP< 110   TRAMADOL (ULTRAM) 50 MG TABLET    Take 50 mg by mouth 3 (three) times daily.   Modified Medications   No medications on file  Discontinued Medications   No medications on file    Physical Exam:  Vitals:   10/17/18 1401  BP: 110/60  Pulse: 67  Temp: 98.5 F (36.9 C)  TempSrc: Oral  SpO2: 95%  Weight: 110 lb (49.9 kg)  Height: 5\' 3"  (1.6 m)   Body mass index is 19.49 kg/m. Wt Readings from Last 3 Encounters:  10/17/18 110 lb (49.9 kg)  10/15/18 113 lb 12.8 oz (51.6 kg)  10/10/18 112 lb (50.8 kg)    Physical Exam Constitutional:      General: She is not in acute distress.    Appearance: She is normal weight. She is not ill-appearing.  Cardiovascular:     Rate and Rhythm: Normal rate and regular rhythm.     Pulses: Normal pulses.     Heart sounds: Normal heart sounds. No murmur. No friction rub. No gallop.   Pulmonary:     Effort: Pulmonary effort is normal. No respiratory distress.     Breath sounds: No wheezing or rales.  Abdominal:     General: Bowel sounds are normal.     Palpations: Abdomen is soft.  Musculoskeletal:        General: No  tenderness.  Right lower leg: Edema present.     Left lower leg: Edema present.     Comments: Unsteady gait walks with front wheel walker. ongoing bilateral lower extremities 2+ edema. Left leg with dressing and wrap per home health, dressing is clean and dry  Skin:    General: Skin is warm and dry.     Coloration: Skin is not pale.     Findings: No bruising, erythema or rash.  Neurological:     Mental Status: She is alert. Mental status is at baseline.     Cranial Nerves: No cranial nerve deficit.     Sensory: No sensory deficit.     Motor: No weakness.     Gait: Gait abnormal.     Labs reviewed: Basic Metabolic Panel: Recent Labs    09/06/18 1433 09/16/18 2337  NA 134* 135  K 4.2 3.8  CL 96* 97*  CO2 28 25  GLUCOSE 85 92  BUN 13 15  CREATININE 0.61 0.62  CALCIUM 9.2 8.8*  TSH 0.38*  --    Liver Function Tests: Recent Labs    09/06/18 1433 09/16/18 2337  AST 27 54*  ALT 26 28  ALKPHOS  --  81  BILITOT 1.0 1.4*  PROT 5.7* 5.9*  ALBUMIN  --  3.7   No results for input(s): LIPASE, AMYLASE in the last 8760 hours. No results for input(s): AMMONIA in the last 8760 hours. CBC: Recent Labs    09/06/18 1433 09/16/18 2337  WBC 7.7 10.3  NEUTROABS 5,613 7.8*  HGB 13.0 12.5  HCT 40.4 39.6  MCV 83.6 84.4  PLT 233 193   Lipid Panel: Recent Labs    09/06/18 1433  CHOL 116  HDL 55  LDLCALC 43  TRIG 101  CHOLHDL 2.1   TSH: Recent Labs    09/06/18 1433  TSH 0.38*   A1C: No results found for: HGBA1C   Assessment/Plan 1. Bilateral lower extremity edema -continues on Demadex 10 mg daily with potassium supplement -instructed to stay off feet and elevate above level of heart as tolerates -to continue with compression hose.   2. Dementia with behavioral disturbance, unspecified dementia type ([place]) -continues on namenda titration, overall without side effects. No increase in behaviors.   3. Open wound of left lower extremity without complication,  subsequent encounter -home health nursing has been set up and they are applying xeroform to wound with wraps around leg. Dressing clean and dry at this time.   Next appt: 10/22/2018 for recheck of wound and swelling.  Carlos American. [place], [facility] [phone]

## 2018-10-18 ENCOUNTER — Other Ambulatory Visit: Payer: Self-pay

## 2018-10-18 ENCOUNTER — Encounter: Payer: Self-pay | Admitting: Nurse Practitioner

## 2018-10-18 ENCOUNTER — Ambulatory Visit (INDEPENDENT_AMBULATORY_CARE_PROVIDER_SITE_OTHER): Payer: Medicare Other | Admitting: Nurse Practitioner

## 2018-10-18 VITALS — BP 108/60 | HR 68 | Temp 98.5°F | Wt 110.4 lb

## 2018-10-18 DIAGNOSIS — L03116 Cellulitis of left lower limb: Secondary | ICD-10-CM

## 2018-10-18 DIAGNOSIS — T148XXA Other injury of unspecified body region, initial encounter: Secondary | ICD-10-CM

## 2018-10-18 DIAGNOSIS — R600 Localized edema: Secondary | ICD-10-CM

## 2018-10-18 MED ORDER — DOXYCYCLINE HYCLATE 100 MG PO TABS: 100.0000 mg | ORAL_TABLET | Freq: Two times a day (BID) | ORAL | 0 refills | Status: DC

## 2018-10-18 NOTE — Patient Instructions (Addendum)
To continue to use xeroform to leg Continue to wrap and elevate leg.  ABIs orders  To start doxycycline 100 mg by mouth twice daily for 10 days To use florastor twice daily for 10 days   To keep follow up as schedule.

## 2018-10-18 NOTE — Progress Notes (Addendum)
Careteam: Patient Care Team: [name], NP as PCP - General (Geriatric Medicine) Jolene Schimke, MD as Referring Physician (Dermatology) Birder Robson, MD as Referring Physician (Ophthalmology) Jaymes Graff, DO as Consulting Physician (Orthopedic Surgery)  Advanced Directive information    No Known Allergies  Chief Complaint  Patient presents with  . Acute Visit    wound on left leg      HPI: Patient is a 85 y.o. female seen in the office today due to concern for infection to open areas.  Home health nursing came today and caregiver reports areas are red.  There is a new open area to the outside of leg.  Areas to inside of leg actually look better but surrounding redness is more.  States temperature at facility was 98.6-99.1   Review of Systems:  Review of Systems  Unable to perform ROS: Dementia    Past Medical History:  Diagnosis Date  . A-fib ([place])   . Anemia   . Anxiety   . Dry skin    on ears  . Gastroesophageal reflux   . Hearing loss    significant  uses hearing aids  . High blood pressure   . Hyperlipidemia   . Loss of appetite   . Loss of smell   . Macular degeneration   . Malnutrition ([place])   . Osteoporosis   . Persistent dry cough   . Skin cancer    basal / squamous  . Slow transit constipation    abstracted from new patient packet   . Vitamin D deficiency 01/26/2018   No past surgical history on file. Social History:   reports that she has never smoked. She has never used smokeless tobacco. She reports that she does not drink alcohol or use drugs.  Family History  Problem Relation Age of Onset  . Atrial fibrillation Sister   . Diabetes Sister   . Osteoarthritis Sister   . Osteopenia Sister   . Mental retardation Sister     Medications: Patient's Medications  New Prescriptions   DOXYCYCLINE (VIBRA-TABS) 100 MG TABLET    Take 1 tablet (100 mg total) by mouth 2 (two) times daily.  Previous Medications   ACETAMINOPHEN (TYLENOL) 325 MG TABLET    Take 650 mg by mouth 2 (two) times daily. And every 6 hours as needed with Tramadol   ALPRAZOLAM (XANAX) 0.25 MG TABLET    Take 0.25 mg by mouth every 6 (six) hours as needed for anxiety.   BUSPIRONE (BUSPAR) 10 MG TABLET    Take 10 mg by mouth 3 (three) times daily.   CALCIUM CARBONATE (OS-CAL) 600 MG TABS TABLET    Take 600 mg by mouth every 12 (twelve) hours.    CHOLECALCIFEROL (VITAMIN D) 25 MCG (1000 UT) TABLET    Take 2,000 Units by mouth daily.   DICLOFENAC SODIUM (VOLTAREN) 1 % GEL    Apply 1 g topically. 3-4 times daily   DOCUSATE SODIUM (COLACE) 100 MG CAPSULE    Take 100 mg by mouth every 12 (twelve) hours as needed for mild constipation.   ENSURE (ENSURE)    Take 237 mLs by mouth daily.   FAMOTIDINE (PEPCID) 20 MG TABLET    Take 20 mg by mouth at bedtime.   HYDROCHLOROTHIAZIDE (HYDRODIURIL) 25 MG TABLET    Take 1 tablet (25 mg total) by mouth daily.   LOPERAMIDE (IMODIUM) 2 MG CAPSULE    Take 2 mg by mouth as needed for diarrhea or loose  stools.   MEMANTINE (NAMENDA XR) 21 MG CP24 24 HR CAPSULE    Take 1 capsule by mouth daily.   METOPROLOL SUCCINATE (TOPROL-XL) 25 MG 24 HR TABLET    Take 25 mg by mouth daily.    MULTIPLE VITAMIN (DAILY VITE) TABS    Take 1 tablet by mouth daily.   MULTIPLE VITAMINS-MINERALS (EQ VISION FORMULA 50+ PO)    Take 1 tablet by mouth every 12 (twelve) hours.   NYSTATIN OINTMENT (MYCOSTATIN)    Apply 1 application topically 3 (three) times daily. Use on back   POLYETHYL GLYCOL-PROPYL GLYCOL (SYSTANE) 0.4-0.3 % GEL OPHTHALMIC GEL    Place 1 application into both eyes as needed. 2 drops into affected eyes every hour for dry eyes/itching   POTASSIUM CHLORIDE (K-DUR) 10 MEQ TABLET    Take 2 tablets (20 mEq total) by mouth daily.   SACCHAROMYCES BOULARDII (FLORASTOR) 250 MG CAPSULE    Take 250 mg by mouth 2 (two) times daily.   SERTRALINE (ZOLOFT) 50 MG TABLET    Take 50 mg by mouth daily.   SIMVASTATIN (ZOCOR) 20 MG TABLET     Take 20 mg by mouth daily.   TORSEMIDE (DEMADEX) 10 MG TABLET    Take 1 tablet (10 mg total) by mouth daily. Hold if SBP< 110   TRAMADOL (ULTRAM) 50 MG TABLET    Take 50 mg by mouth 3 (three) times daily.   Modified Medications   No medications on file  Discontinued Medications   No medications on file    Physical Exam:  Vitals:   10/18/18 1503  BP: 108/60  Pulse: 68  Temp: 98.5 F (36.9 C)  SpO2: 96%  Weight: 110 lb 6.4 oz (50.1 kg)   Body mass index is 19.56 kg/m. Wt Readings from Last 3 Encounters:  10/18/18 110 lb 6.4 oz (50.1 kg)  10/17/18 110 lb (49.9 kg)  10/15/18 113 lb 12.8 oz (51.6 kg)    Physical Exam Vitals signs reviewed.  Constitutional:      General: She is not in acute distress.    Appearance: She is normal weight. She is not ill-appearing.  Cardiovascular:     Rate and Rhythm: Normal rate and regular rhythm.     Pulses: Normal pulses.     Heart sounds: Normal heart sounds. No murmur. No friction rub. No gallop.   Pulmonary:     Effort: Pulmonary effort is normal. No respiratory distress.     Breath sounds: No wheezing, rhonchi or rales.  Chest:     Chest wall: No tenderness.  Abdominal:     General: Bowel sounds are normal.     Palpations: Abdomen is soft.  Musculoskeletal:        General: No tenderness.     Right lower leg: Edema present.     Left lower leg: Edema present.     Comments: Unsteady gait walks with front wheel walker.bilateral lower extremities 2+ edema. Improved from visit yesterday  Skin:    General: Skin is warm and dry.     Coloration: Skin is not pale.     Findings: Erythema (surrounding wounds) present. No bruising or rash.     Comments: Left lower leg medial with 2 open areas side by side- see pic in medial (promimal 1.5 x1, distal 1 x 2 cm)- appear to be open blisters now with surrounding erythema and warmth. 1 open area laterally ( 1x1). Bases to wounds red without purulent drainage  Neurological:  Mental Status:  She is alert. Mental status is at baseline.     Cranial Nerves: No cranial nerve deficit.     Sensory: No sensory deficit.     Motor: No weakness.     Gait: Gait abnormal (uses walker).  Psychiatric:        Mood and Affect: Mood normal.     Labs reviewed: Basic Metabolic Panel: Recent Labs    09/06/18 1433 09/16/18 2337  NA 134* 135  K 4.2 3.8  CL 96* 97*  CO2 28 25  GLUCOSE 85 92  BUN 13 15  CREATININE 0.61 0.62  CALCIUM 9.2 8.8*  TSH 0.38*  --    Liver Function Tests: Recent Labs    09/06/18 1433 09/16/18 2337  AST 27 54*  ALT 26 28  ALKPHOS  --  81  BILITOT 1.0 1.4*  PROT 5.7* 5.9*  ALBUMIN  --  3.7   No results for input(s): LIPASE, AMYLASE in the last 8760 hours. No results for input(s): AMMONIA in the last 8760 hours. CBC: Recent Labs    09/06/18 1433 09/16/18 2337  WBC 7.7 10.3  NEUTROABS 5,613 7.8*  HGB 13.0 12.5  HCT 40.4 39.6  MCV 83.6 84.4  PLT 233 193   Lipid Panel: Recent Labs    09/06/18 1433  CHOL 116  HDL 55  LDLCALC 43  TRIG 101  CHOLHDL 2.1   TSH: Recent Labs    09/06/18 1433  TSH 0.38*   A1C: No results found for: HGBA1C   Assessment/Plan 1. Cellulitis of left lower extremity Wound beds red without signs of infection but surrounding tissues with erythema, will treat for cellulitis at this time as this has significantly increased over the last 3 days. Continues with xeroform gauze to wounds. - doxycycline (VIBRA-TABS) 100 MG tablet; Take 1 tablet (100 mg total) by mouth 2 (two) times daily.  Dispense: 20 tablet; Refill: 0 - VAS [place] ABI WITH/WO TBI; Future  2. Bilateral lower extremity edema -significantly improved today. Continues with compression hose and elevation. Also continues on Demadex with potassium. Has lab work scheduled at facility - doxycycline (VIBRA-TABS) 100 MG tablet; Take 1 tablet (100 mg total) by mouth 2 (two) times daily.  Dispense: 20 tablet; Refill: 0 - VAS [place] ABI WITH/WO TBI; Future  3. Open  wounds To left lower leg. Home health nursing following. Continue xeroform gauze. No infection noted to wound base but cellulitis to surrounding tissue; Will get ABIs and if appropriate can order unna boots to help with edema and wound healing.   Next appt: 10/22/2018 Carlos American. [facility], [facility] [phone]

## 2018-10-19 ENCOUNTER — Ambulatory Visit (HOSPITAL_COMMUNITY)
Admission: RE | Admit: 2018-10-19 | Discharge: 2018-10-19 | Disposition: A | Discharge status: Home / Self Care | Payer: Medicare Other | Source: Ambulatory Visit | Attending: Nurse Practitioner | Admitting: Nurse Practitioner

## 2018-10-19 DIAGNOSIS — L03116 Cellulitis of left lower limb: Secondary | ICD-10-CM

## 2018-10-19 DIAGNOSIS — R600 Localized edema: Secondary | ICD-10-CM

## 2018-10-22 ENCOUNTER — Ambulatory Visit (INDEPENDENT_AMBULATORY_CARE_PROVIDER_SITE_OTHER): Payer: Medicare Other | Admitting: Nurse Practitioner

## 2018-10-22 ENCOUNTER — Other Ambulatory Visit: Payer: Self-pay

## 2018-10-22 ENCOUNTER — Encounter: Payer: Self-pay | Admitting: Nurse Practitioner

## 2018-10-22 ENCOUNTER — Telehealth: Payer: Self-pay | Admitting: *Deleted

## 2018-10-22 VITALS — BP 138/70 | HR 69 | Temp 97.7°F | Resp 10 | Ht 63.0 in | Wt 109.0 lb

## 2018-10-22 DIAGNOSIS — R600 Localized edema: Secondary | ICD-10-CM

## 2018-10-22 DIAGNOSIS — L03116 Cellulitis of left lower limb: Secondary | ICD-10-CM

## 2018-10-22 DIAGNOSIS — T148XXA Other injury of unspecified body region, initial encounter: Secondary | ICD-10-CM

## 2018-10-22 LAB — BASIC METABOLIC PANEL WITH GFR
BUN: 18 mg/dL (ref 7–25)
CO2: 34 mmol/L — ABNORMAL HIGH (ref 20–32)
Calcium: 10.2 mg/dL (ref 8.6–10.4)
Chloride: 95 mmol/L — ABNORMAL LOW (ref 98–110)
Creat: 0.6 mg/dL (ref 0.60–0.88)
GFR, Est African American: 92 mL/min/{1.73_m2} (ref >=60)
GFR, Est Non African American: 80 mL/min/{1.73_m2} (ref >=60)
Glucose, Bld: 83 mg/dL (ref 65–99)
Potassium: 3.5 mmol/L (ref 3.5–5.3)
Sodium: 140 mmol/L (ref 135–146)

## 2018-10-22 NOTE — Telephone Encounter (Signed)
They were NOT to use silver, I ordered xeroform, the wound bed did not show signs of infection but she had cellulitis, she is to be seen in office today for evaluation

## 2018-10-22 NOTE — Progress Notes (Signed)
Careteam: Patient Care Team: [name], NP as PCP - General (Geriatric Medicine) Jolene Schimke, MD as Referring Physician (Dermatology) Birder Robson, MD as Referring Physician (Ophthalmology) Jaymes Graff, DO as Consulting Physician (Orthopedic Surgery)  Advanced Directive information    No Known Allergies  Chief Complaint  Patient presents with  . Follow-up    Follow-up on wound and swelling. Patient with increase in knee swelling yesterday, improved today. [facility] did not receive any orders for wound care following last visit. Here with [name], caregiver  . Best Practice Recommendations    Dicsuss need for DEXA  . Immunizations    Flu vaccine not in stock at [facility]      HPI: Patient is a 85 y.o. female seen in the office today to follow up wound and swelling to left LE.  Pt is frequently refusing compression hose at facility therefore they are not applying but when caregiver gets there she is able to place them on her. Swelling is an ongoing issue but overall caregiver reports much better.  Continues on demadex and potassium supplement Taking doxycyline, having some diarrhea. Taking florastor twice daily  Review of Systems:  Review of Systems  Unable to perform ROS: Dementia    Past Medical History:  Diagnosis Date  . A-fib ([place])   . Anemia   . Anxiety   . Dry skin    on ears  . Gastroesophageal reflux   . Hearing loss    significant  uses hearing aids  . High blood pressure   . Hyperlipidemia   . Loss of appetite   . Loss of smell   . Macular degeneration   . Malnutrition ([place])   . Osteoporosis   . Persistent dry cough   . Skin cancer    basal / squamous  . Slow transit constipation    abstracted from new patient packet   . Vitamin D deficiency 01/26/2018   History reviewed. No pertinent surgical history. Social History:   reports that she has never smoked. She has never used smokeless tobacco. She reports that she does not drink  alcohol or use drugs.  Family History  Problem Relation Age of Onset  . Atrial fibrillation Sister   . Diabetes Sister   . Osteoarthritis Sister   . Osteopenia Sister   . Mental retardation Sister     Medications: Patient's Medications  New Prescriptions   No medications on file  Previous Medications   ACETAMINOPHEN (TYLENOL) 325 MG TABLET    Take 650 mg by mouth 2 (two) times daily. And every 6 hours as needed with Tramadol   ALPRAZOLAM (XANAX) 0.25 MG TABLET    Take 0.25 mg by mouth every 6 (six) hours as needed for anxiety.   BUSPIRONE (BUSPAR) 10 MG TABLET    Take 10 mg by mouth 3 (three) times daily.   CALCIUM CARBONATE (OS-CAL) 600 MG TABS TABLET    Take 600 mg by mouth every 12 (twelve) hours.    CHOLECALCIFEROL (VITAMIN D) 25 MCG (1000 UT) TABLET    Take 2,000 Units by mouth daily.   DICLOFENAC SODIUM (VOLTAREN) 1 % GEL    Apply 1 g topically. 3-4 times daily   DOCUSATE SODIUM (COLACE) 100 MG CAPSULE    Take 100 mg by mouth every 12 (twelve) hours as needed for mild constipation.   DOXYCYCLINE (VIBRA-TABS) 100 MG TABLET    Take 1 tablet (100 mg total) by mouth 2 (two) times daily.   ENSURE (ENSURE)  Take 237 mLs by mouth daily.   FAMOTIDINE (PEPCID) 20 MG TABLET    Take 20 mg by mouth at bedtime.   LOPERAMIDE (IMODIUM) 2 MG CAPSULE    Take 2 mg by mouth as needed for diarrhea or loose stools.   MEMANTINE (NAMENDA XR) 21 MG CP24 24 HR CAPSULE    Take 1 capsule by mouth daily.   METOPROLOL SUCCINATE (TOPROL-XL) 25 MG 24 HR TABLET    Take 25 mg by mouth daily.    MULTIPLE VITAMIN (DAILY VITE) TABS    Take 1 tablet by mouth daily.   MULTIPLE VITAMINS-MINERALS (EQ VISION FORMULA 50+ PO)    Take 1 tablet by mouth every 12 (twelve) hours.   NYSTATIN OINTMENT (MYCOSTATIN)    Apply 1 application topically 3 (three) times daily. Use on back   POLYETHYL GLYCOL-PROPYL GLYCOL (SYSTANE) 0.4-0.3 % GEL OPHTHALMIC GEL    Place 1 application into both eyes as needed. 2 drops into affected  eyes every hour for dry eyes/itching   POTASSIUM CHLORIDE (K-DUR) 10 MEQ TABLET    Take 2 tablets (20 mEq total) by mouth daily.   SACCHAROMYCES BOULARDII (FLORASTOR) 250 MG CAPSULE    Take 250 mg by mouth 2 (two) times daily.   SERTRALINE (ZOLOFT) 50 MG TABLET    Take 50 mg by mouth daily.   SIMVASTATIN (ZOCOR) 20 MG TABLET    Take 20 mg by mouth daily.   TORSEMIDE (DEMADEX) 10 MG TABLET    Take 1 tablet (10 mg total) by mouth daily. Hold if SBP< 110   TRAMADOL (ULTRAM) 50 MG TABLET    Take 50 mg by mouth 3 (three) times daily.   Modified Medications   No medications on file  Discontinued Medications   HYDROCHLOROTHIAZIDE (HYDRODIURIL) 25 MG TABLET    Take 1 tablet (25 mg total) by mouth daily.    Physical Exam:  Vitals:   10/22/18 0957  BP: 138/70  Pulse: 69  Resp: 10  Temp: 97.7 F (36.5 C)  TempSrc: Oral  Weight: 109 lb (49.4 kg)  Height: 5\' 3"  (1.6 m)   Body mass index is 19.31 kg/m. Wt Readings from Last 3 Encounters:  10/22/18 109 lb (49.4 kg)  10/18/18 110 lb 6.4 oz (50.1 kg)  10/17/18 110 lb (49.9 kg)    Physical Exam Vitals signs reviewed.  Constitutional:      General: She is not in acute distress.    Appearance: She is normal weight. She is not ill-appearing.  Cardiovascular:     Rate and Rhythm: Normal rate and regular rhythm.     Pulses: Normal pulses.     Heart sounds: Normal heart sounds. No murmur. No friction rub. No gallop.   Pulmonary:     Effort: Pulmonary effort is normal. No respiratory distress.     Breath sounds: No wheezing, rhonchi or rales.  Chest:     Chest wall: No tenderness.  Abdominal:     General: Bowel sounds are normal.     Palpations: Abdomen is soft.  Musculoskeletal:        General: No tenderness.     Right lower leg: Edema present.     Left lower leg: Edema present.     Comments: Unsteady gait walks with front wheel walker.bilateral lower extremities 2+ edema.  Skin:    General: Skin is warm and dry.     Coloration:  Skin is not pale.     Findings: Erythema (surrounding wounds) present. No bruising or  rash.     Comments: Left lower leg medial with 2 open areas side by side- see pic in medial (promimal 1.5 x1, distal 2.5 cm x 1 cm)- appear to be open blisters surrounding erythema and warmth have improved.. 1 open area laterally ( 1x1). Bases to wounds 80% red and 20%yellow. Scant serous drainage noted  Neurological:     Mental Status: She is alert. Mental status is at baseline.     Cranial Nerves: No cranial nerve deficit.     Sensory: No sensory deficit.     Motor: No weakness.     Gait: Gait abnormal (uses walker).  Psychiatric:        Mood and Affect: Mood normal.     Labs reviewed: Basic Metabolic Panel: Recent Labs    09/06/18 1433 09/16/18 2337  NA 134* 135  K 4.2 3.8  CL 96* 97*  CO2 28 25  GLUCOSE 85 92  BUN 13 15  CREATININE 0.61 0.62  CALCIUM 9.2 8.8*  TSH 0.38*  --    Liver Function Tests: Recent Labs    09/06/18 1433 09/16/18 2337  AST 27 54*  ALT 26 28  ALKPHOS  --  81  BILITOT 1.0 1.4*  PROT 5.7* 5.9*  ALBUMIN  --  3.7   No results for input(s): LIPASE, AMYLASE in the last 8760 hours. No results for input(s): AMMONIA in the last 8760 hours. CBC: Recent Labs    09/06/18 1433 09/16/18 2337  WBC 7.7 10.3  NEUTROABS 5,613 7.8*  HGB 13.0 12.5  HCT 40.4 39.6  MCV 83.6 84.4  PLT 233 193   Lipid Panel: Recent Labs    09/06/18 1433  CHOL 116  HDL 55  LDLCALC 43  TRIG 101  CHOLHDL 2.1   TSH: Recent Labs    09/06/18 1433  TSH 0.38*   A1C: No results found for: HGBA1C   Assessment/Plan 1. Open wound -unchanged, cellulitis has improved. Will need to consistently have edema under control to promote wound healing. Will continue xeroform dressing with wraps surrounding legs - AMB referral to wound care center, appt scheduled for next week- if unable to make this appt to notify our office so we can schedule a recheck  - BASIC METABOLIC PANEL WITH GFR   2. Bilateral lower extremity edema Improved with demadex and compression/wraps. Pending lab work may increase demadex to help with swelling to promote wound healing. - BASIC METABOLIC PANEL WITH GFR  3. Cellulitis Improving with doxycyline. To complete course at this time with probiotic   Next appt: 11/22/2018- as scheduled.  Carlos American. [place], [facility] [phone]

## 2018-10-22 NOTE — Telephone Encounter (Signed)
Nurse Notified and agreed. Nurse request for you to send orders back with patient after seeing her to clarify how often you want dressing changed.

## 2018-10-22 NOTE — Telephone Encounter (Signed)
Jasmine Jones with [facility] called and stated that they need wound care orders. Stated that they thought they were not using the Xeroform. Stated that they were using Silver Alginate Wrap while waiting for the ABI results to come back. Please Clarify.

## 2018-10-22 NOTE — Patient Instructions (Addendum)
Continue on doxycyline until complete  Continue on probiotic twice daily  To continue xeroform, change every 3 days. Continue with compression to both legs Wound care consult made. To notify if unable to keep appt.

## 2018-10-22 NOTE — Telephone Encounter (Signed)
Orders sent with patient

## 2018-10-25 ENCOUNTER — Other Ambulatory Visit: Payer: Self-pay

## 2018-10-25 MED ORDER — TORSEMIDE 20 MG PO TABS: 20.0000 mg | ORAL_TABLET | Freq: Every day | ORAL | 1 refills | Status: DC

## 2018-10-25 MED ORDER — POTASSIUM CHLORIDE CRYS ER 10 MEQ PO TBCR: 30.0000 meq | EXTENDED_RELEASE_TABLET | Freq: Every day | ORAL | 1 refills | Status: DC

## 2018-10-31 ENCOUNTER — Encounter (HOSPITAL_BASED_OUTPATIENT_CLINIC_OR_DEPARTMENT_OTHER): Payer: Medicare Other | Attending: Physician Assistant

## 2018-10-31 ENCOUNTER — Other Ambulatory Visit: Payer: Self-pay

## 2018-10-31 DIAGNOSIS — L97822 Non-pressure chronic ulcer of other part of left lower leg with fat layer exposed: Secondary | ICD-10-CM | POA: Diagnosis present

## 2018-10-31 DIAGNOSIS — F0150 Vascular dementia without behavioral disturbance: Secondary | ICD-10-CM | POA: Diagnosis not present

## 2018-10-31 DIAGNOSIS — I10 Essential (primary) hypertension: Secondary | ICD-10-CM | POA: Diagnosis not present

## 2018-10-31 DIAGNOSIS — I872 Venous insufficiency (chronic) (peripheral): Secondary | ICD-10-CM | POA: Insufficient documentation

## 2018-10-31 DIAGNOSIS — I890 Lymphedema, not elsewhere classified: Secondary | ICD-10-CM | POA: Insufficient documentation

## 2018-11-07 ENCOUNTER — Encounter (HOSPITAL_BASED_OUTPATIENT_CLINIC_OR_DEPARTMENT_OTHER): Payer: Medicare Other | Attending: Physician Assistant

## 2018-11-07 DIAGNOSIS — I10 Essential (primary) hypertension: Secondary | ICD-10-CM | POA: Insufficient documentation

## 2018-11-07 DIAGNOSIS — F0150 Vascular dementia without behavioral disturbance: Secondary | ICD-10-CM | POA: Diagnosis not present

## 2018-11-07 DIAGNOSIS — I890 Lymphedema, not elsewhere classified: Secondary | ICD-10-CM | POA: Insufficient documentation

## 2018-11-07 DIAGNOSIS — L97822 Non-pressure chronic ulcer of other part of left lower leg with fat layer exposed: Secondary | ICD-10-CM | POA: Insufficient documentation

## 2018-11-07 DIAGNOSIS — E042 Nontoxic multinodular goiter: Secondary | ICD-10-CM

## 2018-11-07 DIAGNOSIS — I480 Paroxysmal atrial fibrillation: Secondary | ICD-10-CM

## 2018-11-07 DIAGNOSIS — M810 Age-related osteoporosis without current pathological fracture: Secondary | ICD-10-CM

## 2018-11-07 DIAGNOSIS — L97222 Non-pressure chronic ulcer of left calf with fat layer exposed: Secondary | ICD-10-CM

## 2018-11-07 DIAGNOSIS — S81801A Unspecified open wound, right lower leg, initial encounter: Secondary | ICD-10-CM | POA: Insufficient documentation

## 2018-11-07 DIAGNOSIS — X58XXXA Exposure to other specified factors, initial encounter: Secondary | ICD-10-CM | POA: Insufficient documentation

## 2018-11-07 DIAGNOSIS — I872 Venous insufficiency (chronic) (peripheral): Secondary | ICD-10-CM | POA: Insufficient documentation

## 2018-11-07 DIAGNOSIS — D649 Anemia, unspecified: Secondary | ICD-10-CM

## 2018-11-09 ENCOUNTER — Other Ambulatory Visit: Payer: Self-pay

## 2018-11-14 DIAGNOSIS — L97822 Non-pressure chronic ulcer of other part of left lower leg with fat layer exposed: Secondary | ICD-10-CM | POA: Diagnosis not present

## 2018-11-21 DIAGNOSIS — L97822 Non-pressure chronic ulcer of other part of left lower leg with fat layer exposed: Secondary | ICD-10-CM | POA: Diagnosis not present

## 2018-11-22 ENCOUNTER — Ambulatory Visit: Payer: Medicare Other | Admitting: Nurse Practitioner

## 2018-11-24 DIAGNOSIS — F0150 Vascular dementia without behavioral disturbance: Secondary | ICD-10-CM

## 2018-11-24 DIAGNOSIS — I10 Essential (primary) hypertension: Secondary | ICD-10-CM

## 2018-11-24 DIAGNOSIS — I480 Paroxysmal atrial fibrillation: Secondary | ICD-10-CM

## 2018-11-24 DIAGNOSIS — I739 Peripheral vascular disease, unspecified: Secondary | ICD-10-CM

## 2018-11-24 DIAGNOSIS — I890 Lymphedema, not elsewhere classified: Secondary | ICD-10-CM

## 2018-11-24 DIAGNOSIS — L97823 Non-pressure chronic ulcer of other part of left lower leg with necrosis of muscle: Secondary | ICD-10-CM

## 2018-11-24 DIAGNOSIS — L03116 Cellulitis of left lower limb: Secondary | ICD-10-CM

## 2018-11-24 DIAGNOSIS — I872 Venous insufficiency (chronic) (peripheral): Secondary | ICD-10-CM

## 2018-11-28 ENCOUNTER — Encounter: Payer: Self-pay | Admitting: Nurse Practitioner

## 2018-11-28 ENCOUNTER — Other Ambulatory Visit: Payer: Self-pay

## 2018-11-28 ENCOUNTER — Ambulatory Visit (INDEPENDENT_AMBULATORY_CARE_PROVIDER_SITE_OTHER): Payer: Medicare Other | Admitting: Nurse Practitioner

## 2018-11-28 VITALS — BP 114/64 | HR 62 | Temp 98.3°F | Wt 109.4 lb

## 2018-11-28 DIAGNOSIS — M1990 Unspecified osteoarthritis, unspecified site: Secondary | ICD-10-CM

## 2018-11-28 DIAGNOSIS — F0391 Unspecified dementia with behavioral disturbance: Secondary | ICD-10-CM | POA: Diagnosis not present

## 2018-11-28 DIAGNOSIS — R634 Abnormal weight loss: Secondary | ICD-10-CM

## 2018-11-28 DIAGNOSIS — I10 Essential (primary) hypertension: Secondary | ICD-10-CM

## 2018-11-28 DIAGNOSIS — T148XXA Other injury of unspecified body region, initial encounter: Secondary | ICD-10-CM

## 2018-11-28 DIAGNOSIS — E0590 Thyrotoxicosis, unspecified without thyrotoxic crisis or storm: Secondary | ICD-10-CM

## 2018-11-28 DIAGNOSIS — R600 Localized edema: Secondary | ICD-10-CM | POA: Diagnosis not present

## 2018-11-28 DIAGNOSIS — L97822 Non-pressure chronic ulcer of other part of left lower leg with fat layer exposed: Secondary | ICD-10-CM | POA: Diagnosis not present

## 2018-11-28 DIAGNOSIS — F419 Anxiety disorder, unspecified: Secondary | ICD-10-CM

## 2018-11-28 DIAGNOSIS — I480 Paroxysmal atrial fibrillation: Secondary | ICD-10-CM

## 2018-11-28 MED ORDER — ACETAMINOPHEN 325 MG PO TABS: ORAL_TABLET | ORAL | 0 refills | Status: DC

## 2018-11-28 MED ORDER — TRAMADOL HCL 50 MG PO TABS: 50.0000 mg | ORAL_TABLET | Freq: Two times a day (BID) | ORAL | Status: DC

## 2018-11-28 NOTE — Progress Notes (Addendum)
Careteam: Patient Care Team: [name], NP as PCP - General (Geriatric Medicine) Jolene Schimke, MD as Referring Physician (Dermatology) Birder Robson, MD as Referring Physician (Ophthalmology) Jaymes Graff, DO as Consulting Physician (Orthopedic Surgery)  Advanced Directive information    No Known Allergies  Chief Complaint  Patient presents with  . Medical Management of Chronic Issues    8 week follow up. Patient is being seeing by wound care. Would like to discuss memory. [name] is the patient's caregiver here today with the patient.   . Quality Metric Gaps    Dexa scan and influenza vaccine is scheduled to be done at her facility. She is seeing an eye doctor.     HPI: Patient is a 85 y.o. female seen in the office today for routine follow up.   Pt is following with wound care center. Following up there once they leave here. Going once a week to wrap and check the wound.  Overall improving.  All areas have healed except for 1 spot.  Continues on demadex with potassium,   Dementia- good days a bad days. She remembered that she had 2 appts today but then this morning had forgetten. Caregiver [name] helps her at her assisted living.  Anxiety/depression- has a hard time coping with the fact she can not remember and gets upset with herself. Taking buspar 10 mg TID,  zoloft 50 mg by mouth daily. Caregiver feeling like she is doing better but some days she will get upset  Pain in lower back/hip area.- taking tramadol 50 mg TID. Caregiver was giving her only twice daily and felt like that had good control of her pain. Has tylenol (which she declines frequently) and voltaren gel.   GERD- controlled on pepcid.   Weight loss- stable, continues on ensure daily  htn-controlled on metoprolol and Demadex with potassium supplement.   Hyperlipidemia- continues on statin. Eats what she is given at assisted living.   Constipation- not having issues with constipation.   Review of Systems:  Review of Systems  Unable to perform ROS: Dementia    Past Medical History:  Diagnosis Date  . A-fib ([place])   . Anemia   . Anxiety   . Dry skin    on ears  . Gastroesophageal reflux   . Hearing loss    significant  uses hearing aids  . High blood pressure   . Hyperlipidemia   . Loss of appetite   . Loss of smell   . Macular degeneration   . Malnutrition ([place])   . Osteoporosis   . Persistent dry cough   . Skin cancer    basal / squamous  . Slow transit constipation    abstracted from new patient packet   . Vitamin D deficiency 01/26/2018   History reviewed. No pertinent surgical history. Social History:   reports that she has never smoked. She has never used smokeless tobacco. She reports that she does not drink alcohol or use drugs.  Family History  Problem Relation Age of Onset  . Atrial fibrillation Sister   . Diabetes Sister   . Osteoarthritis Sister   . Osteopenia Sister   . Mental retardation Sister     Medications: Patient's Medications  New Prescriptions   No medications on file  Previous Medications   ACETAMINOPHEN (TYLENOL) 325 MG TABLET    Take 650 mg by mouth 2 (two) times daily. And every 6 hours as needed with Tramadol   ALPRAZOLAM (XANAX) 0.25 MG TABLET  Take 0.25 mg by mouth every 6 (six) hours as needed for anxiety.   BUSPIRONE (BUSPAR) 10 MG TABLET    Take 10 mg by mouth 3 (three) times daily.   CALCIUM CARBONATE (OS-CAL) 600 MG TABS TABLET    Take 600 mg by mouth every 12 (twelve) hours.    CHOLECALCIFEROL (VITAMIN D) 25 MCG (1000 UT) TABLET    Take 2,000 Units by mouth daily.   DICLOFENAC SODIUM (VOLTAREN) 1 % GEL    Apply 1 g topically. 3-4 times daily   DOCUSATE SODIUM (COLACE) 100 MG CAPSULE    Take 100 mg by mouth every 12 (twelve) hours as needed for mild constipation.   ENSURE (ENSURE)    Take 237 mLs by mouth daily.   FAMOTIDINE (PEPCID) 20 MG TABLET    Take 20 mg by mouth at bedtime.   LOPERAMIDE (IMODIUM) 2 MG  CAPSULE    Take 2 mg by mouth as needed for diarrhea or loose stools.   MEMANTINE (NAMENDA XR) 21 MG CP24 24 HR CAPSULE    Take 1 capsule by mouth daily.   METOPROLOL SUCCINATE (TOPROL-XL) 25 MG 24 HR TABLET    Take 25 mg by mouth daily.    MULTIPLE VITAMIN (DAILY VITE) TABS    Take 1 tablet by mouth daily.   MULTIPLE VITAMINS-MINERALS (EQ VISION FORMULA 50+ PO)    Take 1 tablet by mouth every 12 (twelve) hours.   NYSTATIN OINTMENT (MYCOSTATIN)    Apply 1 application topically 3 (three) times daily. Use on back   POLYETHYL GLYCOL-PROPYL GLYCOL (SYSTANE) 0.4-0.3 % GEL OPHTHALMIC GEL    Place 1 application into both eyes as needed. 2 drops into affected eyes every hour for dry eyes/itching   POTASSIUM CHLORIDE (K-DUR) 10 MEQ TABLET    Take 3 tablets (30 mEq total) by mouth daily.   SACCHAROMYCES BOULARDII (FLORASTOR) 250 MG CAPSULE    Take 250 mg by mouth 2 (two) times daily.   SERTRALINE (ZOLOFT) 50 MG TABLET    Take 50 mg by mouth daily.   SIMVASTATIN (ZOCOR) 20 MG TABLET    Take 20 mg by mouth daily.   TORSEMIDE (DEMADEX) 20 MG TABLET    Take 1 tablet (20 mg total) by mouth daily.   TRAMADOL (ULTRAM) 50 MG TABLET    Take 50 mg by mouth 3 (three) times daily.   Modified Medications   No medications on file  Discontinued Medications   DOXYCYCLINE (VIBRA-TABS) 100 MG TABLET    Take 1 tablet (100 mg total) by mouth 2 (two) times daily.    Physical Exam:  Vitals:   11/28/18 1121  BP: 114/64  Pulse: 62  Temp: 98.3 F (36.8 C)  SpO2: 98%  Weight: 109 lb 6.4 oz (49.6 kg)   Body mass index is 19.38 kg/m. Wt Readings from Last 3 Encounters:  11/28/18 109 lb 6.4 oz (49.6 kg)  10/22/18 109 lb (49.4 kg)  10/18/18 110 lb 6.4 oz (50.1 kg)    Physical Exam Constitutional:      General: She is not in acute distress.    Appearance: She is normal weight. She is not ill-appearing.  Cardiovascular:     Rate and Rhythm: Normal rate and regular rhythm.     Pulses: Normal pulses.     Heart  sounds: Normal heart sounds. No murmur. No friction rub. No gallop.   Pulmonary:     Effort: Pulmonary effort is normal. No respiratory distress.     Breath  sounds: No wheezing, rhonchi or rales.  Chest:     Chest wall: No tenderness.  Abdominal:     General: Bowel sounds are normal.     Palpations: Abdomen is soft.  Musculoskeletal:        General: No tenderness.     Right lower leg: Edema present.     Left lower leg: Edema present.     Comments: Unsteady gait walks with front wheel walker.bilateral lower extremities 1+ edema.  Skin:    General: Skin is warm and dry.     Coloration: Skin is not pale.     Findings: No bruising or rash.     Comments: Bilateral legs covered, following with the wound clinic.  Neurological:     Mental Status: She is alert. Mental status is at baseline.     Cranial Nerves: No cranial nerve deficit.     Sensory: No sensory deficit.     Motor: No weakness.     Gait: Gait abnormal (uses walker).  Psychiatric:        Mood and Affect: Mood normal.        Behavior: Behavior is withdrawn.     Labs reviewed: Basic Metabolic Panel: Recent Labs    09/06/18 1433 09/16/18 2337 10/22/18 1057  NA 134* 135 140  K 4.2 3.8 3.5  CL 96* 97* 95*  CO2 28 25 34*  GLUCOSE 85 92 83  BUN 13 15 18   CREATININE 0.61 0.62 0.60  CALCIUM 9.2 8.8* 10.2  TSH 0.38*  --   --    Liver Function Tests: Recent Labs    09/06/18 1433 09/16/18 2337  AST 27 54*  ALT 26 28  ALKPHOS  --  81  BILITOT 1.0 1.4*  PROT 5.7* 5.9*  ALBUMIN  --  3.7   No results for input(s): LIPASE, AMYLASE in the last 8760 hours. No results for input(s): AMMONIA in the last 8760 hours. CBC: Recent Labs    09/06/18 1433 09/16/18 2337  WBC 7.7 10.3  NEUTROABS 5,613 7.8*  HGB 13.0 12.5  HCT 40.4 39.6  MCV 83.6 84.4  PLT 233 193   Lipid Panel: Recent Labs    09/06/18 1433  CHOL 116  HDL 55  LDLCALC 43  TRIG 101  CHOLHDL 2.1   TSH: Recent Labs    09/06/18 1433  TSH 0.38*    A1C: No results found for: HGBA1C   Assessment/Plan 1. Open wound On left leg, no signs of infection per caregiver at this time. Continues to follow up with wound center weekly with improvement.   2. Bilateral lower extremity edema -improved at this time. Continues with compression, elevation (not as much as she should) and diuretic  - COMPLETE METABOLIC PANEL WITH GFR  3. Dementia with behavioral disturbance, unspecified dementia type ([place]) Ongoing, some days worse than others. Continues on namenda with caregivers at her assisted living   4. Essential hypertension Stable on current regimen.   5. Paroxysmal atrial fibrillation ([facility]) Rate controlled, not on anticoagulation due to high fall risk and hx of subdural hematoma.   6. Osteoarthritis, unspecified osteoarthritis type, unspecified site Stable. Will decrease tramadol to 50 mg BID and tylenol to 650 mg mid-day and can take additional dose every 6 hours if needed  7. Weight loss -continues to take ensure for nutritional supplement. Weight has been stable - COMPLETE METABOLIC PANEL WITH GFR  8. Anxiety -ongoing, some days better than others. Will continue current regimen with buspar TID, zoloft 50  mg daily and xanax PRN  9.hyperthryoid -noted on last labs, will add free t3, free t4 and TSH to labs   Next appt: 3 months for routine follow up [place]. [place], [facility] [phone]

## 2018-11-28 NOTE — Patient Instructions (Signed)
To decrease tramadol to twice daily  To decrease tylenol to only be scheduled mid day and every 6 hours if needed for increase in pain  FOLLOW UP in 3 months

## 2018-11-29 ENCOUNTER — Telehealth: Payer: Self-pay | Admitting: *Deleted

## 2018-11-29 LAB — COMPLETE METABOLIC PANEL WITH GFR
AG Ratio: 1.7 (calc) (ref 1.0–2.5)
ALT: 11 U/L (ref 6–29)
AST: 22 U/L (ref 10–35)
Albumin: 3.8 g/dL (ref 3.6–5.1)
Alkaline phosphatase (APISO): 81 U/L (ref 37–153)
BUN: 20 mg/dL (ref 7–25)
CO2: 35 mmol/L — ABNORMAL HIGH (ref 20–32)
Calcium: 9.5 mg/dL (ref 8.6–10.4)
Chloride: 94 mmol/L — ABNORMAL LOW (ref 98–110)
Creat: 0.72 mg/dL (ref 0.60–0.88)
GFR, Est African American: 85 mL/min/{1.73_m2} (ref >=60)
GFR, Est Non African American: 73 mL/min/{1.73_m2} (ref >=60)
Globulin: 2.2 g/dL (calc) (ref 1.9–3.7)
Glucose, Bld: 76 mg/dL (ref 65–139)
Potassium: 3.8 mmol/L (ref 3.5–5.3)
Sodium: 138 mmol/L (ref 135–146)
Total Bilirubin: 0.8 mg/dL (ref 0.2–1.2)
Total Protein: 6 g/dL — ABNORMAL LOW (ref 6.1–8.1)

## 2018-11-29 LAB — T3, FREE: T3, Free: 3.1 pg/mL (ref 2.3–4.2)

## 2018-11-29 LAB — TEST AUTHORIZATION

## 2018-11-29 LAB — TSH: TSH: 1.49 mIU/L (ref 0.40–4.50)

## 2018-11-29 LAB — T4, FREE: Free T4: 1.1 ng/dL (ref 0.8–1.8)

## 2018-11-29 NOTE — Telephone Encounter (Signed)
Should be great than. Can we send a verbal over for that. Thank you

## 2018-11-29 NOTE — Telephone Encounter (Signed)
[name] stated that she will have them fax over an order for Jasmine Jones to sign.

## 2018-11-29 NOTE — Telephone Encounter (Signed)
[name], Caregiver called and stated that she meant to ask yesterday at appointment. Stated that [employer] is questioning the orders you wrote to be contacted if patient's Blood pressure is <140/90. Stated that this does not sound right to them. Please Advise.

## 2018-12-05 DIAGNOSIS — L97822 Non-pressure chronic ulcer of other part of left lower leg with fat layer exposed: Secondary | ICD-10-CM | POA: Diagnosis not present

## 2018-12-19 ENCOUNTER — Encounter (HOSPITAL_BASED_OUTPATIENT_CLINIC_OR_DEPARTMENT_OTHER): Payer: Medicare Other | Attending: Physician Assistant | Admitting: Physician Assistant

## 2018-12-19 ENCOUNTER — Encounter (HOSPITAL_BASED_OUTPATIENT_CLINIC_OR_DEPARTMENT_OTHER): Payer: Self-pay

## 2018-12-19 ENCOUNTER — Other Ambulatory Visit: Payer: Self-pay

## 2018-12-19 DIAGNOSIS — L97822 Non-pressure chronic ulcer of other part of left lower leg with fat layer exposed: Secondary | ICD-10-CM | POA: Diagnosis present

## 2018-12-19 DIAGNOSIS — F0150 Vascular dementia without behavioral disturbance: Secondary | ICD-10-CM | POA: Diagnosis not present

## 2018-12-19 DIAGNOSIS — I872 Venous insufficiency (chronic) (peripheral): Secondary | ICD-10-CM | POA: Diagnosis not present

## 2018-12-19 DIAGNOSIS — I10 Essential (primary) hypertension: Secondary | ICD-10-CM | POA: Diagnosis not present

## 2018-12-19 NOTE — Progress Notes (Signed)
Jasmine Jones, [place] ([id number]) Visit Report for 12/19/2018 Chief Complaint Document Details Patient Name: Date of Service: Jasmine, Jones 12/19/2018 1:45 PM Medical Record [id number] Patient Account Number: [id number] Date of Birth/Sex: Treating RN: 01/16/1926 (85 y.o. Female) Jasmine Jones Primary Care Provider: [name] Other Clinician: [name] Referring Provider: Treating Provider/Extender:[name], Jasmine Jones, Jasmine Jones in Treatment: 7 Information Obtained from: Patient Chief Complaint Left LE Ulcers Electronic Signature(s) Signed: 12/19/2018 6:05:26 PM By: Worthy Keeler PA-C Entered By: Worthy Keeler on 12/19/2018 14:09:26 -------------------------------------------------------------------------------- HPI Details Patient Name: Date of Service: Jasmine, Jones 12/19/2018 1:45 PM Medical Record [id number] Patient Account Number: [id number] Date of Birth/Sex: Treating RN: [birth year] (85 y.o. Female) Jasmine Jones Primary Care Provider: [name] Other Clinician: [name] Referring Provider: Treating Provider/Extender:[name], Jasmine Jones, Jasmine Jones in Treatment: 7 History of Present Illness HPI Description: 10/31/2018 on evaluation today patient presents for initial evaluation in our clinic concerning an issue that began 1 month ago. She has wounds over the left lower extremity which unfortunately have been giving her trouble. She does have a history of lymphedema as well as venous insufficiency. The patient also has hypertension and dementia. Incidentally she is also very hard of hearing and fortunately her family member was with her today to note our conversation to help relay things to her at home. She also has macular degeneration and has difficulty with her vision which makes this doubly unfortunate. With regard to the wounds currently the patient initially had an Unna boot with alginate placed  over the wound areas. After seeing her primary care provider they recommended Xeroform with just wrapping over the wounds themselves not a full compression wrap. Subsequently the patient was also placed on 2 rounds of doxycycline by her primary care provider. She had ABIs which were performed on 10/19/2018 and revealed that she does have normal ABIs with a right ABI of 0.96 with a TBI of 0.64 and a left ABI of 1.19 with a TBI of 0.52. She does have some discomfort though not too significant at this point with cleaning over the wound area. With that being said I think that she is going to require some sharp debridement at 1 of the wound locations if she is able to tolerate it. 11/07/2018 on evaluation today patient seems to be making good progress in regard to her wounds on the left lower extremity. She does have what appears to be a slight skin tear on the right lower extremity posteriorly but this is very small at this point. I do not think is going to take too much to get this to heal and affect the skin is already intact and present at this point. For that reason I believe that it is very likely that we will be able to get this to heal up without any additional complications or problems. 11/14/2018 on evaluation today patient's right lower extremity appears to be completely healed which is great news. On the left lower extremity the medial ulcer is showing signs of improvement as far as the Iodoflex helping to clean up the surface of the wound. The lateral wound is not really showing much signs of improvement nor worsening it just is at this point. Nonetheless I think she may do better with switching for the lateral wound to a dressing with collagen. 11/21/2018 on evaluation today patient appears to be doing decently well at this time 11/21/2018 on evaluation today patient actually appears to be doing better with regard to her lower extremity ulcers.  The lateral ulcer actually shown signs of almost  complete epithelialization which is excellent news. We have been using collagen at this site. The medial ulcer were still using Iodoflex on since that seems to be doing well to clean up the wound bed and overall things are doing quite well. She is not quite ready for the collagen as of yet. 11/28/2018 on evaluation today patient appears to be doing well with regard to her left medial lower extremity ulcer. She has been tolerating the dressing change without complication. With that being said I am very pleased with how things appear today. She does seem to have some issues with discomfort mainly when I am cleaning the area although I do not think she is getting need the Iodoflex any longer. I think we can just switch to a collagen based dressing after today. Her wound did require some sharp debridement. 12/05/2018 on evaluation today patient actually appears to be doing quite well with regard to her lower extremity ulcer. She has been tolerating the dressing changes without complication. Fortunately she is actually showing signs of improvement which is good news. The wound is measuring significantly smaller. 12/19/2018 upon evaluation today patient actually appears to be doing better with regard to her lower extremity ulcer. Fortunately this is showing signs of being slightly smaller it is looking much more healthy and overall I am very pleased with how things are progressing. No fevers, chills, nausea, vomiting, or diarrhea. Electronic Signature(s) Signed: 12/19/2018 6:05:26 PM By: Worthy Keeler PA-C Entered By: Worthy Keeler on 12/19/2018 14:19:15 -------------------------------------------------------------------------------- Physical Exam Details Patient Name: Date of Service: Jasmine, Jones 12/19/2018 1:45 PM Medical Record [id number] Patient Account Number: [id number] Date of Birth/Sex: Treating RN: 02/13/1926 (85 y.o. Female) Jasmine Jones Primary Care Provider: [name] Other Clinician: [name] Referring Provider: Treating Provider/Extender:[name], Jasmine Jones, Jasmine Jones in Treatment: 7 Constitutional Well-nourished and well-hydrated in no acute distress. Respiratory normal breathing without difficulty. clear to auscultation bilaterally. Cardiovascular regular rate and rhythm with normal S1, S2. Psychiatric this patient is able to make decisions and demonstrates good insight into disease process. Alert and Oriented x 3. pleasant and cooperative. Notes His wound bed currently showed signs of excellent granulation at this time there does not appear to be any signs of active infection and overall the patient is doing quite well as far as I am concerned. I am extremely pleased with the progress that she has made. She is not having any pain like she was in the past this also is good news. Electronic Signature(s) Signed: 12/19/2018 6:05:26 PM By: Worthy Keeler PA-C Entered By: Worthy Keeler on 12/19/2018 14:19:49 -------------------------------------------------------------------------------- Physician Orders Details Patient Name: Date of Service: Jasmine, Jones 12/19/2018 1:45 PM Medical Record [id number] Patient Account Number: [id number] Date of Birth/Sex: Treating RN: 09-17-1925 (85 y.o. Female) Jasmine Jones, Jasmine Jones Primary Care Provider: [name] Other Clinician: [name] Referring Provider: Treating Provider/Extender:[name], Jasmine Jones, Jasmine Jones in Treatment: 7 Verbal / Phone Orders: No Diagnosis Coding ICD-10 Coding Code Description I89.0 Lymphedema, not elsewhere classified I87.2 Venous insufficiency (chronic) (peripheral) L97.822 Non-pressure chronic ulcer of other part of left lower leg with fat layer exposed S81.801A Unspecified open wound, right lower leg, initial encounter [place] (primary) hypertension F01.50 Vascular dementia without behavioral disturbance Follow-up  Appointments Return appointment in 3 weeks. Dressing Change Frequency Wound #1 Left,Medial Lower Leg Other: - 2 times per week Skin Barriers/Peri-Wound Care Moisturizing lotion - to leg Wound Cleansing Wound #1 Left,Medial Lower  Leg Clean wound with Normal Saline. Primary Wound Dressing Wound #1 Left,Medial Lower Leg Silver Collagen - moisten with saline Secondary Dressing Wound #1 Left,Medial Lower Leg Dry Gauze Edema Control 3 Layer Compression System - Left Lower Extremity Avoid standing for long periods of time Elevate legs to the level of the heart or above for 30 minutes daily and/or when sitting, a frequency of: - throughout the day Exercise regularly Support Garment 20-30 mm/Hg pressure to: - right leg daily [place] skilled nursing for wound care. - [facility] fax [phone] Electronic Signature(s) Signed: 12/19/2018 5:58:48 PM By: Jasmine Jones Signed: 12/19/2018 6:05:26 PM By: Worthy Keeler PA-C Entered By: Jasmine Jones on 12/19/2018 14:12:27 -------------------------------------------------------------------------------- Problem List Details Patient Name: Date of Service: Jasmine, Jones 12/19/2018 1:45 PM Medical Record [id number] Patient Account Number: [id number] Date of Birth/Sex: Treating RN: 1925-12-12 (85 y.o. Female) Jasmine Jones, Jasmine Jones Primary Care Provider: [name] Other Clinician: [name] Referring Provider: Treating Provider/Extender:[name], Jasmine Jones, Jasmine Jones in Treatment: 7 Active Problems ICD-10 Evaluated Encounter Code Description Active Date Today Diagnosis I89.0 Lymphedema, not elsewhere classified 10/31/2018 No Yes I87.2 Venous insufficiency (chronic) (peripheral) 10/31/2018 No Yes L97.822 Non-pressure chronic ulcer of other part of left lower 10/31/2018 No Yes leg with fat layer exposed S81.801A Unspecified open wound, right lower leg, initial 11/07/2018 No Yes encounter [place] (primary) hypertension 10/31/2018 No Yes F01.50 Vascular dementia without behavioral disturbance 10/31/2018 No Yes Inactive Problems Resolved Problems Electronic Signature(s) Signed: 12/19/2018 6:05:26 PM By: Worthy Keeler PA-C Entered By: Worthy Keeler on 12/19/2018 14:09:22 -------------------------------------------------------------------------------- Progress Note Details Patient Name: Date of Service: Jasmine, Jones 12/19/2018 1:45 PM Medical Record [id number] Patient Account Number: [id number] Date of Birth/Sex: Treating RN: [birth year] (85 y.o. Female) Jasmine Jones Primary Care Provider: [name] Other Clinician: [name] Referring Provider: Treating Provider/Extender:[name], Jasmine Jones, Jasmine Jones in Treatment: 7 Subjective Chief Complaint Information obtained from Patient Left LE Ulcers History of Present Illness (HPI) 10/31/2018 on evaluation today patient presents for initial evaluation in our clinic concerning an issue that began 1 month ago. She has wounds over the left lower extremity which unfortunately have been giving her trouble. She does have a history of lymphedema as well as venous insufficiency. The patient also has hypertension and dementia. Incidentally she is also very hard of hearing and fortunately her family member was with her today to note our conversation to help relay things to her at home. She also has macular degeneration and has difficulty with her vision which makes this doubly unfortunate. With regard to the wounds currently the patient initially had an Unna boot with alginate placed over the wound areas. After seeing her primary care provider they recommended Xeroform with just wrapping over the wounds themselves not a full compression wrap. Subsequently the patient was also placed on 2 rounds of doxycycline by her primary care provider. She had ABIs which were performed on 10/19/2018 and revealed  that she does have normal ABIs with a right ABI of 0.96 with a TBI of 0.64 and a left ABI of 1.19 with a TBI of 0.52. She does have some discomfort though not too significant at this point with cleaning over the wound area. With that being said I think that she is going to require some sharp debridement at 1 of the wound locations if she is able to tolerate it. 11/07/2018 on evaluation today patient seems to be making good progress in regard to her wounds on the left  lower extremity. She does have what appears to be a slight skin tear on the right lower extremity posteriorly but this is very small at this point. I do not think is going to take too much to get this to heal and affect the skin is already intact and present at this point. For that reason I believe that it is very likely that we will be able to get this to heal up without any additional complications or problems. 11/14/2018 on evaluation today patient's right lower extremity appears to be completely healed which is great news. On the left lower extremity the medial ulcer is showing signs of improvement as far as the Iodoflex helping to clean up the surface of the wound. The lateral wound is not really showing much signs of improvement nor worsening it just is at this point. Nonetheless I think she may do better with switching for the lateral wound to a dressing with collagen. 11/21/2018 on evaluation today patient appears to be doing decently well at this time 11/21/2018 on evaluation today patient actually appears to be doing better with regard to her lower extremity ulcers. The lateral ulcer actually shown signs of almost complete epithelialization which is excellent news. We have been using collagen at this site. The medial ulcer were still using Iodoflex on since that seems to be doing well to clean up the wound bed and overall things are doing quite well. She is not quite ready for the collagen as of yet. 11/28/2018 on evaluation today  patient appears to be doing well with regard to her left medial lower extremity ulcer. She has been tolerating the dressing change without complication. With that being said I am very pleased with how things appear today. She does seem to have some issues with discomfort mainly when I am cleaning the area although I do not think she is getting need the Iodoflex any longer. I think we can just switch to a collagen based dressing after today. Her wound did require some sharp debridement. 12/05/2018 on evaluation today patient actually appears to be doing quite well with regard to her lower extremity ulcer. She has been tolerating the dressing changes without complication. Fortunately she is actually showing signs of improvement which is good news. The wound is measuring significantly smaller. 12/19/2018 upon evaluation today patient actually appears to be doing better with regard to her lower extremity ulcer. Fortunately this is showing signs of being slightly smaller it is looking much more healthy and overall I am very pleased with how things are progressing. No fevers, chills, nausea, vomiting, or diarrhea. Patient History Information obtained from Patient. Family History Heart Disease - Mother,Father, No family history of Cancer, Diabetes, Hereditary Spherocytosis, Hypertension, Kidney Disease, Lung Disease, Seizures, Stroke, Thyroid Problems, Tuberculosis. Social History Never smoker, Marital Status - Widowed, Alcohol Use - Never, Drug Use - No History, Caffeine Use - Daily. Medical History Eyes Denies history of Cataracts, Glaucoma, Optic Neuritis Ear/Nose/Mouth/Throat Denies history of Chronic sinus problems/congestion, Middle ear problems Hematologic/Lymphatic Denies history of Anemia, Hemophilia, Human Immunodeficiency Virus, Lymphedema, Sickle Cell Disease Respiratory Denies history of Aspiration, Asthma, Chronic Obstructive Pulmonary Disease (COPD), Pneumothorax, Sleep Apnea,  Tuberculosis Cardiovascular Patient has history of Hypertension Denies history of Angina, Arrhythmia, Congestive Heart Failure, Coronary Artery Disease, Deep Vein Thrombosis, Hypotension, Myocardial Infarction, Peripheral Arterial Disease, Peripheral Venous Disease, Phlebitis, Vasculitis Gastrointestinal Denies history of Cirrhosis , Colitis, Crohnoos, Hepatitis A, Hepatitis B, Hepatitis C Endocrine Denies history of Type I Diabetes, Type II Diabetes Genitourinary Denies  history of End Stage Renal Disease Immunological Denies history of Lupus Erythematosus, Raynaudoos, Scleroderma Integumentary (Skin) Denies history of History of Burn Musculoskeletal Denies history of Gout, Rheumatoid Arthritis, Osteoarthritis, Osteomyelitis Neurologic Patient has history of Dementia Denies history of Neuropathy, Quadriplegia, Paraplegia, Seizure Disorder Oncologic Denies history of Received Chemotherapy, Received Radiation Psychiatric Denies history of Anorexia/bulimia, Confinement Anxiety Review of Systems (ROS) Constitutional Symptoms (General Health) Denies complaints or symptoms of Fatigue, Fever, Chills, Marked Weight Change. Respiratory Denies complaints or symptoms of Chronic or frequent coughs, Shortness of Breath. Cardiovascular Denies complaints or symptoms of Chest pain. Psychiatric Denies complaints or symptoms of Claustrophobia, Suicidal. Objective Constitutional Well-nourished and well-hydrated in no acute distress. Vitals Time Taken: 1:40 PM, Height: 63 in, Weight: 107 lbs, BMI: 19, Temperature: 98.2 F, Pulse: 63 bpm, Respiratory Rate: 18 breaths/min, Blood Pressure: 111/58 mmHg. Respiratory normal breathing without difficulty. clear to auscultation bilaterally. Cardiovascular regular rate and rhythm with normal S1, S2. Psychiatric this patient is able to make decisions and demonstrates good insight into disease process. Alert and Oriented x 3. pleasant and  cooperative. General Notes: His wound bed currently showed signs of excellent granulation at this time there does not appear to be any signs of active infection and overall the patient is doing quite well as far as I am concerned. I am extremely pleased with the progress that she has made. She is not having any pain like she was in the past this also is good news. Integumentary (Hair, Skin) Wound #1 status is Open. Original cause of wound was Gradually Appeared. The wound is located on the Left,Medial Lower Leg. The wound measures 2cm length x 1cm width x 0.1cm depth; 1.571cm^2 area and 0.157cm^3 volume. There is Fat Layer (Subcutaneous Tissue) Exposed exposed. There is no tunneling or undermining noted. There is a medium amount of serosanguineous drainage noted. The wound margin is distinct with the outline attached to the wound base. There is large (67-100%) pink granulation within the wound bed. There is a small (1-33%) amount of necrotic tissue within the wound bed including Adherent Slough. Assessment Active Problems ICD-10 Lymphedema, not elsewhere classified Venous insufficiency (chronic) (peripheral) Non-pressure chronic ulcer of other part of left lower leg with fat layer exposed Unspecified open wound, right lower leg, initial encounter [place] (primary) hypertension Vascular dementia without behavioral disturbance Procedures Wound #1 Pre-procedure diagnosis of Wound #1 is a Venous Leg Ulcer located on the Left,Medial Lower Leg . There was a Three Layer Compression Therapy Procedure by Kela Millin, RN. Post procedure Diagnosis Wound #1: Same as Pre-Procedure Plan Follow-up Appointments: Return appointment in 3 weeks. Dressing Change Frequency: Wound #1 Left,Medial Lower Leg: Other: - 2 times per week Skin Barriers/Peri-Wound Care: Moisturizing lotion - to leg Wound Cleansing: Wound #1 Left,Medial Lower Leg: Clean wound with Normal Saline. Primary Wound  Dressing: Wound #1 Left,Medial Lower Leg: Silver Collagen - moisten with saline Secondary Dressing: Wound #1 Left,Medial Lower Leg: Dry Gauze Edema Control: 3 Layer Compression System - Left Lower Extremity Avoid standing for long periods of time Elevate legs to the level of the heart or above for 30 minutes daily and/or when sitting, a frequency of: - throughout the day Exercise regularly Support Garment 20-30 mm/Hg pressure to: - right leg daily Home Health: [place] [facility] for wound care. - [facility] fax [phone] 1. I would recommend that we go ahead and continue with the silver collagen dressing as that seems to be doing well for the patient and they are  in agreement with this plan. 2. I would recommend we continue with 3 layer compression wrap home health is seeing her as well and changing this on a regular basis she seems to be doing very well. 3. We will plan to see the patient back in 3 weeks since I will not be here in 2 and they are in agreement with that plan although they know if anything worsens or changes they will contact the office and let me know. We will see patient back for reevaluation in 3 weeks here in the clinic. If anything worsens or changes patient will contact our office for additional recommendations. Electronic Signature(s) Signed: 12/19/2018 6:05:26 PM By: Worthy Keeler PA-C Entered By: Worthy Keeler on 12/19/2018 14:20:32 -------------------------------------------------------------------------------- HxROS Details Patient Name: Date of Service: Jasmine, Jones 12/19/2018 1:45 PM Medical Record [id number] Patient Account Number: [id number] Date of Birth/Sex: Treating RN: 04-May-1925 (85 y.o. Female) Jasmine Jones Primary Care Provider: [name] Other Clinician: [name] Referring Provider: Treating Provider/Extender:[name], Jasmine Jones, Jasmine Jones in Treatment: 7 Information Obtained  From Patient Constitutional Symptoms (General Health) Complaints and Symptoms: Negative for: Fatigue; Fever; Chills; Marked Weight Change Respiratory Complaints and Symptoms: Negative for: Chronic or frequent coughs; Shortness of Breath Medical History: Negative for: Aspiration; Asthma; Chronic Obstructive Pulmonary Disease (COPD); Pneumothorax; Sleep Apnea; Tuberculosis Cardiovascular Complaints and Symptoms: Negative for: Chest pain Medical History: Positive for: Hypertension Negative for: Angina; Arrhythmia; Congestive Heart Failure; Coronary Artery Disease; Deep Vein Thrombosis; Hypotension; Myocardial Infarction; Peripheral Arterial Disease; Peripheral Venous Disease; Phlebitis; Vasculitis Psychiatric Complaints and Symptoms: Negative for: Claustrophobia; Suicidal Medical History: Negative for: Anorexia/bulimia; Confinement Anxiety Eyes Medical History: Negative for: Cataracts; Glaucoma; Optic Neuritis Ear/Nose/Mouth/Throat Medical History: Negative for: Chronic sinus problems/congestion; Middle ear problems Hematologic/Lymphatic Medical History: Negative for: Anemia; Hemophilia; Human Immunodeficiency Virus; Lymphedema; Sickle Cell Disease Gastrointestinal Medical History: Negative for: Cirrhosis ; Colitis; Crohns; Hepatitis A; Hepatitis B; Hepatitis C Endocrine Medical History: Negative for: Type I Diabetes; Type II Diabetes Genitourinary Medical History: Negative for: End Stage Renal Disease Immunological Medical History: Negative for: Lupus Erythematosus; Raynauds; Scleroderma Integumentary (Skin) Medical History: Negative for: History of Burn Musculoskeletal Medical History: Negative for: Gout; Rheumatoid Arthritis; Osteoarthritis; Osteomyelitis Neurologic Medical History: Positive for: Dementia Negative for: Neuropathy; Quadriplegia; Paraplegia; Seizure Disorder Oncologic Medical History: Negative for: Received Chemotherapy; Received  Radiation Immunizations Pneumococcal Vaccine: Received Pneumococcal Vaccination: No Implantable Devices None Family and Social History Cancer: No; Diabetes: No; Heart Disease: Yes - Mother,Father; Hereditary Spherocytosis: No; Hypertension: No; Kidney Disease: No; Lung Disease: No; Seizures: No; Stroke: No; Thyroid Problems: No; Tuberculosis: No; Never smoker; Marital Status - Widowed; Alcohol Use: Never; Drug Use: No History; Caffeine Use: Daily; Financial Concerns: No; Food, Clothing or Shelter Needs: No; Support System Lacking: No; Transportation Concerns: No Physician Affirmation I have reviewed and agree with the above information. Electronic Signature(s) Signed: 12/19/2018 5:58:48 PM By: Jasmine Jones Signed: 12/19/2018 6:05:26 PM By: Worthy Keeler PA-C Entered By: Worthy Keeler on 12/19/2018 14:19:31 -------------------------------------------------------------------------------- SuperBill Details Patient Name: Date of Service: Jasmine, Jones 12/19/2018 Medical Record [phone] Patient Account Number: [id number] Date of Birth/Sex: Treating RN: 1925/09/09 (85 y.o. Female) Jasmine Jones Primary Care Provider: [name] Other Clinician: [name] Referring Provider: Treating Provider/Extender:[name], Jasmine Jones, Jasmine Jones in Treatment: 7 Diagnosis Coding ICD-10 Codes Code Description I89.0 Lymphedema, not elsewhere classified I87.2 Venous insufficiency (chronic) (peripheral) L97.822 Non-pressure chronic ulcer of other part of left lower leg with fat layer exposed S81.801A Unspecified open wound, right lower leg,  initial encounter [place] (primary) hypertension F01.50 Vascular dementia without behavioral disturbance Facility Procedures CPT4 Code Description: [id number] (Facility Use Only) [phone] - [place] LWR LT LEG Modifier: Quantity: 1 Physician Procedures CPT4 Code Description: [id number] - WC PHYS LEVEL 4 -  EST PT ICD-10 Diagnosis Description I89.0 Lymphedema, not elsewhere classified I87.2 Venous insufficiency (chronic) (peripheral) L97.822 Non-pressure chronic ulcer of other part of left lower  S81.801A Unspecified open wound, right lower leg, initial encoun Modifier: leg with fat laye ter Quantity: 1 r exposed Electronic Signature(s) Signed: 12/19/2018 6:05:26 PM By: Worthy Keeler PA-C Entered By: Worthy Keeler on 12/19/2018 14:20:57

## 2018-12-20 ENCOUNTER — Other Ambulatory Visit: Payer: Self-pay | Admitting: *Deleted

## 2018-12-20 DIAGNOSIS — M1990 Unspecified osteoarthritis, unspecified site: Secondary | ICD-10-CM

## 2018-12-20 MED ORDER — TRAMADOL HCL 50 MG PO TABS: 50.0000 mg | ORAL_TABLET | Freq: Two times a day (BID) | ORAL | 0 refills | Status: DC

## 2018-12-20 NOTE — Telephone Encounter (Signed)
They called and stated that they were going to send to their pharmacy and were able to do so but if you wanted to Escribe that was fine to.   The one we can't send to is [facility], clinical.   Pended Rx and sent to [facility] for approval.

## 2018-12-20 NOTE — Telephone Encounter (Signed)
Sidney with [facility] called and stated that patient only has 5 tablets left of Tramadol and they need a new Rx to be faxed to them to send to the pharmacy for refill.  Wants a Rx faxed to Fax: [phone]  Printed Rx and placed in [place] folder to review and sign.

## 2018-12-20 NOTE — Telephone Encounter (Signed)
This prescriptions has to be sent in electronically due to latest guidelines, please find out what pharmacy we need to directly send this to.

## 2018-12-27 ENCOUNTER — Other Ambulatory Visit: Payer: Self-pay

## 2018-12-27 ENCOUNTER — Emergency Department (HOSPITAL_COMMUNITY): Payer: Medicare Other

## 2018-12-27 ENCOUNTER — Encounter (HOSPITAL_COMMUNITY): Payer: Self-pay | Admitting: Family Medicine

## 2018-12-27 ENCOUNTER — Inpatient Hospital Stay (HOSPITAL_COMMUNITY)
Admission: EM | Admit: 2018-12-27 | Discharge: 2019-01-01 | DRG: 481 | Disposition: A | Discharge status: Skilled Nursing Facility | Payer: Medicare Other | Source: Skilled Nursing Facility | Attending: Internal Medicine | Admitting: Internal Medicine

## 2018-12-27 DIAGNOSIS — E559 Vitamin D deficiency, unspecified: Secondary | ICD-10-CM | POA: Diagnosis present

## 2018-12-27 DIAGNOSIS — Z419 Encounter for procedure for purposes other than remedying health state, unspecified: Secondary | ICD-10-CM

## 2018-12-27 DIAGNOSIS — R0902 Hypoxemia: Secondary | ICD-10-CM | POA: Diagnosis not present

## 2018-12-27 DIAGNOSIS — J9811 Atelectasis: Secondary | ICD-10-CM | POA: Diagnosis not present

## 2018-12-27 DIAGNOSIS — H9190 Unspecified hearing loss, unspecified ear: Secondary | ICD-10-CM | POA: Diagnosis present

## 2018-12-27 DIAGNOSIS — S7221XA Displaced subtrochanteric fracture of right femur, initial encounter for closed fracture: Secondary | ICD-10-CM | POA: Diagnosis present

## 2018-12-27 DIAGNOSIS — Y92099 Unspecified place in other non-institutional residence as the place of occurrence of the external cause: Secondary | ICD-10-CM | POA: Diagnosis not present

## 2018-12-27 DIAGNOSIS — R944 Abnormal results of kidney function studies: Secondary | ICD-10-CM | POA: Diagnosis present

## 2018-12-27 DIAGNOSIS — Z833 Family history of diabetes mellitus: Secondary | ICD-10-CM

## 2018-12-27 DIAGNOSIS — R339 Retention of urine, unspecified: Secondary | ICD-10-CM | POA: Diagnosis not present

## 2018-12-27 DIAGNOSIS — I10 Essential (primary) hypertension: Secondary | ICD-10-CM | POA: Diagnosis present

## 2018-12-27 DIAGNOSIS — K219 Gastro-esophageal reflux disease without esophagitis: Secondary | ICD-10-CM | POA: Diagnosis present

## 2018-12-27 DIAGNOSIS — W19XXXA Unspecified fall, initial encounter: Secondary | ICD-10-CM | POA: Diagnosis present

## 2018-12-27 DIAGNOSIS — D62 Acute posthemorrhagic anemia: Secondary | ICD-10-CM | POA: Diagnosis not present

## 2018-12-27 DIAGNOSIS — S72001A Fracture of unspecified part of neck of right femur, initial encounter for closed fracture: Secondary | ICD-10-CM | POA: Diagnosis present

## 2018-12-27 DIAGNOSIS — Z66 Do not resuscitate: Secondary | ICD-10-CM | POA: Diagnosis present

## 2018-12-27 DIAGNOSIS — S72141A Displaced intertrochanteric fracture of right femur, initial encounter for closed fracture: Secondary | ICD-10-CM | POA: Diagnosis present

## 2018-12-27 DIAGNOSIS — S72121A Displaced fracture of lesser trochanter of right femur, initial encounter for closed fracture: Secondary | ICD-10-CM | POA: Diagnosis present

## 2018-12-27 DIAGNOSIS — S72111A Displaced fracture of greater trochanter of right femur, initial encounter for closed fracture: Secondary | ICD-10-CM | POA: Diagnosis present

## 2018-12-27 DIAGNOSIS — I452 Bifascicular block: Secondary | ICD-10-CM | POA: Diagnosis present

## 2018-12-27 DIAGNOSIS — F0390 Unspecified dementia without behavioral disturbance: Secondary | ICD-10-CM | POA: Diagnosis present

## 2018-12-27 DIAGNOSIS — Y839 Surgical procedure, unspecified as the cause of abnormal reaction of the patient, or of later complication, without mention of misadventure at the time of the procedure: Secondary | ICD-10-CM | POA: Diagnosis not present

## 2018-12-27 DIAGNOSIS — Z20828 Contact with and (suspected) exposure to other viral communicable diseases: Secondary | ICD-10-CM | POA: Diagnosis present

## 2018-12-27 DIAGNOSIS — F418 Other specified anxiety disorders: Secondary | ICD-10-CM | POA: Diagnosis present

## 2018-12-27 DIAGNOSIS — Z79899 Other long term (current) drug therapy: Secondary | ICD-10-CM

## 2018-12-27 DIAGNOSIS — E876 Hypokalemia: Secondary | ICD-10-CM | POA: Diagnosis not present

## 2018-12-27 DIAGNOSIS — F419 Anxiety disorder, unspecified: Secondary | ICD-10-CM | POA: Diagnosis present

## 2018-12-27 DIAGNOSIS — H3530 Unspecified macular degeneration: Secondary | ICD-10-CM | POA: Diagnosis present

## 2018-12-27 DIAGNOSIS — I491 Atrial premature depolarization: Secondary | ICD-10-CM | POA: Diagnosis present

## 2018-12-27 DIAGNOSIS — M1990 Unspecified osteoarthritis, unspecified site: Secondary | ICD-10-CM

## 2018-12-27 DIAGNOSIS — E785 Hyperlipidemia, unspecified: Secondary | ICD-10-CM | POA: Diagnosis present

## 2018-12-27 DIAGNOSIS — J9589 Other postprocedural complications and disorders of respiratory system, not elsewhere classified: Secondary | ICD-10-CM | POA: Diagnosis not present

## 2018-12-27 DIAGNOSIS — I4891 Unspecified atrial fibrillation: Secondary | ICD-10-CM | POA: Diagnosis present

## 2018-12-27 DIAGNOSIS — F329 Major depressive disorder, single episode, unspecified: Secondary | ICD-10-CM | POA: Diagnosis present

## 2018-12-27 DIAGNOSIS — M810 Age-related osteoporosis without current pathological fracture: Secondary | ICD-10-CM | POA: Diagnosis present

## 2018-12-27 LAB — CBC WITH DIFFERENTIAL/PLATELET
Abs Immature Granulocytes: 0.07 10*3/uL (ref 0.00–0.07)
Basophils Absolute: 0 10*3/uL (ref 0.0–0.1)
Basophils Relative: 0 %
Eosinophils Absolute: 0 10*3/uL (ref 0.0–0.5)
Eosinophils Relative: 0 %
HCT: 40.3 % (ref 36.0–46.0)
Hemoglobin: 12.8 g/dL (ref 12.0–15.0)
Immature Granulocytes: 1 %
Lymphocytes Relative: 14 %
Lymphs Abs: 1.6 10*3/uL (ref 0.7–4.0)
MCH: 26.8 pg (ref 26.0–34.0)
MCHC: 31.8 g/dL (ref 30.0–36.0)
MCV: 84.3 fL (ref 80.0–100.0)
Monocytes Absolute: 0.8 10*3/uL (ref 0.1–1.0)
Monocytes Relative: 7 %
Neutro Abs: 9.1 10*3/uL — ABNORMAL HIGH (ref 1.7–7.7)
Neutrophils Relative %: 78 %
Platelets: 213 10*3/uL (ref 150–400)
RBC: 4.78 MIL/uL (ref 3.87–5.11)
RDW: 13.2 % (ref 11.5–15.5)
WBC: 11.7 10*3/uL — ABNORMAL HIGH (ref 4.0–10.5)
nRBC: 0 % (ref 0.0–0.2)

## 2018-12-27 LAB — COMPREHENSIVE METABOLIC PANEL
ALT: 12 U/L (ref 0–44)
AST: 51 U/L — ABNORMAL HIGH (ref 15–41)
Albumin: 3.6 g/dL (ref 3.5–5.0)
Alkaline Phosphatase: 82 U/L (ref 38–126)
Anion gap: 14 (ref 5–15)
BUN: 27 mg/dL — ABNORMAL HIGH (ref 8–23)
CO2: 30 mmol/L (ref 22–32)
Calcium: 9.3 mg/dL (ref 8.9–10.3)
Chloride: 93 mmol/L — ABNORMAL LOW (ref 98–111)
Creatinine, Ser: 0.84 mg/dL (ref 0.44–1.00)
GFR calc Af Amer: >60 mL/min (ref >60)
GFR calc non Af Amer: >60 mL/min (ref >60)
Glucose, Bld: 120 mg/dL — ABNORMAL HIGH (ref 70–99)
Potassium: 4.2 mmol/L (ref 3.5–5.1)
Sodium: 137 mmol/L (ref 135–145)
Total Bilirubin: 1.9 mg/dL — ABNORMAL HIGH (ref 0.3–1.2)
Total Protein: 6.5 g/dL (ref 6.5–8.1)

## 2018-12-27 LAB — SARS CORONAVIRUS 2 BY RT PCR (HOSPITAL ORDER, PERFORMED IN ~~LOC~~ HOSPITAL LAB): SARS Coronavirus 2: NEGATIVE

## 2018-12-27 MED ORDER — CHLORHEXIDINE GLUCONATE 4 % EX LIQD
60.0000 mL | Freq: Once | CUTANEOUS | Status: AC
  Administered 2018-12-28: 4 via TOPICAL
  Filled 2018-12-27: qty 60

## 2018-12-27 MED ORDER — ENSURE PRE-SURGERY PO LIQD
296.0000 mL | Freq: Once | ORAL | Status: AC
  Administered 2018-12-28: 296 mL via ORAL
  Filled 2018-12-27: qty 296

## 2018-12-27 MED ORDER — CEFAZOLIN SODIUM-DEXTROSE 2-4 GM/100ML-% IV SOLN
2.0000 g | INTRAVENOUS | Status: AC
  Administered 2018-12-28: 2 g via INTRAVENOUS
  Filled 2018-12-27 (×2): qty 100

## 2018-12-27 MED ORDER — MORPHINE SULFATE (PF) 4 MG/ML IV SOLN
4.0000 mg | Freq: Once | INTRAVENOUS | Status: AC
  Administered 2018-12-27: 4 mg via INTRAVENOUS
  Filled 2018-12-27: qty 1

## 2018-12-27 MED ORDER — POVIDONE-IODINE 10 % EX SWAB: 2.0000 "application " | Freq: Once | CUTANEOUS | Status: DC

## 2018-12-27 MED ORDER — HYDROMORPHONE HCL 1 MG/ML IJ SOLN
1.0000 mg | Freq: Once | INTRAMUSCULAR | Status: AC
  Administered 2018-12-27: 1 mg via INTRAVENOUS
  Filled 2018-12-27: qty 1

## 2018-12-27 NOTE — ED Notes (Signed)
Patient transported to CT 

## 2018-12-27 NOTE — Progress Notes (Signed)
Patient ID: Jasmine Jones, female   DOB: May 04, 1925, 85 y.o.   MRN: [id number] I have reviewed the x-rays on this patient.  She has an intertrochanteric hip fracture.  She will need open reduction internal fixation.  I plan to do this tomorrow 1:30 to 2:00 PM.   She will be admitted by the medical service and cleared for surgical intervention.  I will meet her tomorrow morning but unavailable for question should this arise.  I will do a consult note on the patient tonight.  I will not undergone a physical exam on her at this point but to expedite her care will go ahead and do her consult tonight and I'll see her first thing in the morning. Dorna Leitz

## 2018-12-27 NOTE — ED Notes (Signed)
ED TO INPATIENT HANDOFF REPORT  ED Nurse Name and Phone #:  Leafy Kindle [phone]  S Name/Age/Gender Jasmine Jones 85 y.o. female Room/Bed: WA04/WA04  Code Status   Code Status: Not on file  Home/SNF/Other Nursing Home Patient oriented to: self and place Is this baseline? Yes   Triage Complete: Triage complete  Chief Complaint Fall; Hip Injury  Triage Note Patient is from [facility]. She experienced an unwitnessed fall and was found sitting in the chair. She now complains of right hip pain that radiates into the leg. EMS noted shortening and rotation of that leg. 100 mcg of fentanyl was administered to the patient.    EMS vitals:  144/88 BP 80 HR 98% O2 sat on room air 18 Resp Rate 168 CBG     Allergies No Known Allergies  Level of Care/Admitting Diagnosis ED Disposition    ED Disposition Condition Comment   Admit  Hospital Area: [place] [[id number]]  Level of Care: Med-Surg [16]  Covid Evaluation: Asymptomatic Screening Protocol (No Symptoms)  Diagnosis: Closed right hip fracture, initial encounter [facility]) [id number]  Admitting Physician: Vianne Bulls [id number]  Attending Physician: Vianne Bulls [id number]  Estimated length of stay: past midnight tomorrow  Certification:: I certify this patient will need inpatient services for at least 2 midnights  PT Class (Do Not Modify): Inpatient [101]  PT Acc Code (Do Not Modify): Private [1]       B Medical/Surgery History Past Medical History:  Diagnosis Date  . A-fib ([place])   . Anemia   . Anxiety   . Dry skin    on ears  . Gastroesophageal reflux   . Hearing loss    significant  uses hearing aids  . High blood pressure   . Hyperlipidemia   . Loss of appetite   . Loss of smell   . Macular degeneration   . Malnutrition ([place])   . Osteoporosis   . Persistent dry cough   . Skin cancer    basal / squamous  . Slow transit constipation    abstracted from  new patient packet   . Vitamin D deficiency 01/26/2018   No past surgical history on file.   A IV Location/Drains/Wounds Patient Lines/Drains/Airways Status   Active Line/Drains/Airways    Name:   Placement date:   Placement time:   Site:   Days:   Peripheral IV 12/27/18 Left Forearm   12/27/18    1900    Forearm   less than 1          Intake/Output Last 24 hours No intake or output data in the 24 hours ending 12/27/18 2301  Labs/Imaging Results for orders placed or performed during the hospital encounter of 12/27/18 (from the past 48 hour(s))  CBC with Differential/Platelet     Status: Abnormal   Collection Time: 12/27/18  8:14 PM  Result Value Ref Range   WBC 11.7 (H) 4.0 - 10.5 K/uL   RBC 4.78 3.87 - 5.11 MIL/uL   Hemoglobin 12.8 12.0 - 15.0 g/dL   HCT 40.3 36.0 - 46.0 %   MCV 84.3 80.0 - 100.0 fL   MCH 26.8 26.0 - 34.0 pg   MCHC 31.8 30.0 - 36.0 g/dL   RDW 13.2 11.5 - 15.5 %   Platelets 213 150 - 400 K/uL   nRBC 0.0 0.0 - 0.2 %   Neutrophils Relative % 78 %   Neutro Abs 9.1 (H) 1.7 - 7.7 K/uL  Lymphocytes Relative 14 %   Lymphs Abs 1.6 0.7 - 4.0 K/uL   Monocytes Relative 7 %   Monocytes Absolute 0.8 0.1 - 1.0 K/uL   Eosinophils Relative 0 %   Eosinophils Absolute 0.0 0.0 - 0.5 K/uL   Basophils Relative 0 %   Basophils Absolute 0.0 0.0 - 0.1 K/uL   Immature Granulocytes 1 %   Abs Immature Granulocytes 0.07 0.00 - 0.07 K/uL    Comment: Performed at [facility], [place] [address], [place], [place] [address]  Comprehensive metabolic panel     Status: Abnormal   Collection Time: 12/27/18  8:14 PM  Result Value Ref Range   Sodium 137 135 - 145 mmol/L   Potassium 4.2 3.5 - 5.1 mmol/L   Chloride 93 (L) 98 - 111 mmol/L   CO2 30 22 - 32 mmol/L   Glucose, Bld 120 (H) 70 - 99 mg/dL   BUN 27 (H) 8 - 23 mg/dL   Creatinine, Ser 0.84 0.44 - 1.00 mg/dL   Calcium 9.3 8.9 - 10.3 mg/dL   Total Protein 6.5 6.5 - 8.1 g/dL   Albumin 3.6 3.5 - 5.0 g/dL   AST  51 (H) 15 - 41 U/L   ALT 12 0 - 44 U/L   Alkaline Phosphatase 82 38 - 126 U/L   Total Bilirubin 1.9 (H) 0.3 - 1.2 mg/dL   GFR calc non Af Amer >60 >60 mL/min   GFR calc Af Amer >60 >60 mL/min   Anion gap 14 5 - 15    Comment: Performed at [facility], [place] [address], [place], [place] [address]  SARS Coronavirus 2 by RT PCR (hospital order, performed in [facility] hospital lab) Nasopharyngeal Nasopharyngeal Swab     Status: None   Collection Time: 12/27/18  9:29 PM   Specimen: Nasopharyngeal Swab  Result Value Ref Range   SARS Coronavirus 2 NEGATIVE NEGATIVE    Comment: (NOTE) If result is NEGATIVE SARS-CoV-2 target nucleic acids are NOT DETECTED. The SARS-CoV-2 RNA is generally detectable in upper and lower  respiratory specimens during the acute phase of infection. The lowest  concentration of SARS-CoV-2 viral copies this assay can detect is 250  copies / mL. A negative result does not preclude SARS-CoV-2 infection  and should not be used as the sole basis for treatment or other  patient management decisions.  A negative result may occur with  improper specimen collection / handling, submission of specimen other  than nasopharyngeal swab, presence of viral mutation(s) within the  areas targeted by this assay, and inadequate number of viral copies  (<250 copies / mL). A negative result must be combined with clinical  observations, patient history, and epidemiological information. If result is POSITIVE SARS-CoV-2 target nucleic acids are DETECTED. The SARS-CoV-2 RNA is generally detectable in upper and lower  respiratory specimens dur ing the acute phase of infection.  Positive  results are indicative of active infection with SARS-CoV-2.  Clinical  correlation with patient history and other diagnostic information is  necessary to determine patient infection status.  Positive results do  not rule out bacterial infection or co-infection with other viruses. If  result is PRESUMPTIVE POSTIVE SARS-CoV-2 nucleic acids MAY BE PRESENT.   A presumptive positive result was obtained on the submitted specimen  and confirmed on repeat testing.  While 2019 novel coronavirus  (SARS-CoV-2) nucleic acids may be present in the submitted sample  additional confirmatory testing may be necessary for epidemiological  and /  or clinical management purposes  to differentiate between  SARS-CoV-2 and other Sarbecovirus currently known to infect humans.  If clinically indicated additional testing with an alternate test  methodology [phone]) is advised. The SARS-CoV-2 RNA is generally  detectable in upper and lower respiratory sp ecimens during the acute  phase of infection. The expected result is Negative. Fact Sheet for Patients:  StrictlyIdeas.no Fact Sheet for Healthcare Providers: BankingDealers.co.za This test is not yet approved or cleared by the [place] FDA and has been authorized for detection and/or diagnosis of SARS-CoV-2 by FDA under an Emergency Use Authorization (EUA).  This EUA will remain in effect (meaning this test can be used) for the duration of the COVID-19 declaration under Section 564(b)(1) of the Act, 21 U.S.C. section 360bbb-3(b)(1), unless the authorization is terminated or revoked sooner. Performed at [facility], [place] [address] [place], [place] [address]    Dg Chest 1 View  Result Date: 12/27/2018 CLINICAL DATA:  85 year old female with history of fall.  Hip pain. EXAM: CHEST  1 VIEW COMPARISON:  Chest x-ray 11/13/2016. FINDINGS: Lung volumes are normal. No consolidative airspace disease. No pleural effusions. No pneumothorax. No pulmonary nodule or mass noted. Pulmonary vasculature and the cardiomediastinal silhouette are within normal limits. Atherosclerotic calcifications in the thoracic aorta. IMPRESSION: 1.  No radiographic evidence of acute cardiopulmonary  disease. 2. Aortic atherosclerosis. Electronically Signed   By: Vinnie Langton M.D.   On: 12/27/2018 21:00   Dg Pelvis 1-2 Views  Result Date: 12/27/2018 CLINICAL DATA:  Fall EXAM: PELVIS - 1-2 VIEW COMPARISON:  None. FINDINGS: There is a comminuted impacted fracture of the right proximal femur involving the greater and lesser trochanters. The femoral head still articulates with the acetabulum. No other definite fracture seen. IMPRESSION: Comminuted impacted proximal right femoral fracture involving the greater and lesser trochanters. Electronically Signed   By: Prudencio Pair M.D.   On: 12/27/2018 20:59   Ct Head Wo Contrast  Result Date: 12/27/2018 CLINICAL DATA:  Head trauma.  Fall EXAM: CT HEAD WITHOUT CONTRAST CT CERVICAL SPINE WITHOUT CONTRAST TECHNIQUE: Multidetector CT imaging of the head and cervical spine was performed following the standard protocol without intravenous contrast. Multiplanar CT image reconstructions of the cervical spine were also generated. COMPARISON:  CT head 09/17/2018 FINDINGS: CT HEAD FINDINGS Brain: Moderate atrophy. Chronic ischemic changes in the white matter. Chronic infarct left frontal lobe unchanged. Negative for acute infarct.  Negative for acute hemorrhage or mass Vascular: Negative for hyperdense vessel Skull:   Negative for fracture Sinuses/Orbits: Bubbly secretions in the right sphenoid sinus otherwise clear sinuses. Bilateral cataract surgery. Other: None CT CERVICAL SPINE FINDINGS Alignment: Mild retrolisthesis C3-4. Mild anterolisthesis C7-T1, T1-T2, T2-3 Skull base and vertebrae: Negative for fracture or mass Soft tissues and spinal canal: 14 mm right thyroid nodule. No adenopathy. Disc levels: Multilevel disc and facet degeneration throughout the cervical spine. No significant spinal stenosis. Upper chest: Irregular soft tissue density with associated calcification left upper lobe anteriorly. This was present on prior chest CT October 22, 2017. Probable  scarring Other: None IMPRESSION: Atrophy and chronic ischemic changes. No acute intracranial abnormality Negative for cervical spine fracture.  Cervical spondylosis 14 mm right thyroid nodule. No further imaging is indicated based on size of nodule and patient age. Irregular soft tissue density with calcification left upper lobe anteriorly, probable scarring. Electronically Signed   By: Franchot Gallo M.D.   On: 12/27/2018 20:40   Ct Cervical Spine Wo Contrast  Result Date: 12/27/2018  CLINICAL DATA:  Head trauma.  Fall EXAM: CT HEAD WITHOUT CONTRAST CT CERVICAL SPINE WITHOUT CONTRAST TECHNIQUE: Multidetector CT imaging of the head and cervical spine was performed following the standard protocol without intravenous contrast. Multiplanar CT image reconstructions of the cervical spine were also generated. COMPARISON:  CT head 09/17/2018 FINDINGS: CT HEAD FINDINGS Brain: Moderate atrophy. Chronic ischemic changes in the white matter. Chronic infarct left frontal lobe unchanged. Negative for acute infarct.  Negative for acute hemorrhage or mass Vascular: Negative for hyperdense vessel Skull:   Negative for fracture Sinuses/Orbits: Bubbly secretions in the right sphenoid sinus otherwise clear sinuses. Bilateral cataract surgery. Other: None CT CERVICAL SPINE FINDINGS Alignment: Mild retrolisthesis C3-4. Mild anterolisthesis C7-T1, T1-T2, T2-3 Skull base and vertebrae: Negative for fracture or mass Soft tissues and spinal canal: 14 mm right thyroid nodule. No adenopathy. Disc levels: Multilevel disc and facet degeneration throughout the cervical spine. No significant spinal stenosis. Upper chest: Irregular soft tissue density with associated calcification left upper lobe anteriorly. This was present on prior chest CT October 22, 2017. Probable scarring Other: None IMPRESSION: Atrophy and chronic ischemic changes. No acute intracranial abnormality Negative for cervical spine fracture.  Cervical spondylosis 14 mm right  thyroid nodule. No further imaging is indicated based on size of nodule and patient age. Irregular soft tissue density with calcification left upper lobe anteriorly, probable scarring. Electronically Signed   By: Franchot Gallo M.D.   On: 12/27/2018 20:40   Dg Knee Complete 4 Views Right  Result Date: 12/27/2018 CLINICAL DATA:  Fall EXAM: RIGHT KNEE - COMPLETE 4+ VIEW COMPARISON:  None. FINDINGS: No definite fracture or dislocation. Tricompartmental osteoarthritis is seen most notable in the medial compartment with joint space loss and marginal osteophyte formation. No large knee joint effusion. Dense vascular calcifications are noted. IMPRESSION: No acute osseous abnormality. Electronically Signed   By: Prudencio Pair M.D.   On: 12/27/2018 21:00   Dg Femur Min 2 Views Right  Result Date: 12/27/2018 CLINICAL DATA:  Fall EXAM: RIGHT FEMUR 2 VIEWS COMPARISON:  None. FINDINGS: Comminuted impacted fracture of the proximal right femoral shaft involving the greater and lesser trochanters. Femoral head is still seated in the acetabulum. A healed fracture deformity of the fibula. 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 Dense vascular calcifications are noted. There is diffuse osteopenia. IMPRESSION: Comminuted impacted fracture of the proximal femoral shaft above the greater lesser trochanter. Electronically Signed   By: Prudencio Pair M.D.   On: 12/27/2018 21:01    Pending Labs Unresulted Labs (From admission, onward)    Start     Ordered   12/27/18 2145  [place]  Once,   R     12/27/18 2145          Vitals/Pain Today's Vitals   12/27/18 2131 12/27/18 2200 12/27/18 2230 12/27/18 2236  BP: (!) 163/93 126/77 125/62   Pulse: (!) 113 (!) 103 94   Resp: (!) 21 20 16    Temp:      TempSrc:      SpO2: 98% 96% 93%   PainSc:    10-Worst pain ever    Isolation Precautions No active isolations  Medications Medications  morphine 4 MG/ML injection 4 mg (4 mg Intravenous Given 12/27/18  2050)  HYDROmorphone (DILAUDID) injection 1 mg (1 mg Intravenous Given 12/27/18 2214)    Mobility walks High fall risk     R Recommendations: See Admitting Provider Note  Report given to:  [facility]  5E  1520

## 2018-12-27 NOTE — Progress Notes (Signed)
Pearlette, Sylvester ([id number]) Visit Report for 11/28/2018 Chief Complaint Document Details Patient Name: Date of Service: Jasmine, Jones 11/28/2018 1:45 PM Medical Record [id number] Patient Account Number: Date of Birth/Sex: Treating RN: 26-Mar-1925 (85 y.o. Female) Baruch Gouty Primary Care Provider: Sherrie Mustache Other Clinician: Referring Provider: Treating Provider/Extender:[name], Mardi Mainland, Loni Muse in Treatment: 4 Information Obtained from: Patient Chief Complaint Left LE Ulcers Electronic Signature(s) Signed: 11/28/2018 1:29:40 PM By: Worthy Keeler PA-C Entered By: Worthy Keeler on 11/28/2018 13:29:39 -------------------------------------------------------------------------------- Debridement Details Patient Name: Date of Service: Jasmine, Jones 11/28/2018 1:45 PM Medical Record [id number] Patient Account Number: Date of Birth/Sex: 10-29-1925 (85 y.o. Female) Treating RN: Baruch Gouty Primary Care Provider: Sherrie Mustache Other Clinician: Referring Provider: Treating Provider/Extender:[name], Mardi Mainland, Loni Muse in Treatment: 4 Debridement Performed for Wound #1 Left,Medial Lower Leg Assessment: Performed By: Physician Worthy Keeler, PA Debridement Type: Debridement Severity of Tissue Pre Fat layer exposed Debridement: Level of Consciousness (Pre- Awake and Alert procedure): Pre-procedure Verification/Time Out Taken: Yes - 14:10 Start Time: 14:11 Pain Control: Lidocaine 5% topical ointment Total Area Debrided (L x W): 2.5 (cm) x 2.4 (cm) = 6 (cm) Tissue and other material Viable, Non-Viable, Slough, Subcutaneous, Skin: Epidermis, Fibrin/Exudate, Slough debrided: Level: Skin/Subcutaneous Tissue Debridement Description: Excisional Instrument: Curette Bleeding: Minimum Hemostasis Achieved: Pressure End Time: 14:16 Procedural Pain: 6 Post Procedural Pain: 3 Response to Treatment: Procedure was  tolerated well Level of Consciousness Awake and Alert (Post-procedure): Post Debridement Measurements of Total Wound Length: (cm) 2.5 Width: (cm) 1.7 Depth: (cm) 0.1 Volume: (cm) 0.334 Character of Wound/Ulcer Post Improved Debridement: Severity of Tissue Post Debridement: Fat layer exposed Post Procedure Diagnosis Same as Pre-procedure Electronic Signature(s) Signed: 11/28/2018 5:03:35 PM By: Worthy Keeler PA-C Signed: 11/28/2018 5:29:01 PM By: Baruch Gouty RN, BSN Entered By: Baruch Gouty on 11/28/2018 14:16:44 -------------------------------------------------------------------------------- HPI Details Patient Name: Date of Service: Jasmine, Jones 11/28/2018 1:45 PM Medical Record [id number] Patient Account Number: Date of Birth/Sex: Treating RN: [birth year] (85 y.o. Female) Baruch Gouty Primary Care Provider: Sherrie Mustache Other Clinician: Referring Provider: Treating Provider/Extender:[name], Mardi Mainland, Loni Muse in Treatment: 4 History of Present Illness HPI Description: 10/31/2018 on evaluation today patient presents for initial evaluation in our clinic concerning an issue that began 1 month ago. She has wounds over the left lower extremity which unfortunately have been giving her trouble. She does have a history of lymphedema as well as venous insufficiency. The patient also has hypertension and dementia. Incidentally she is also very hard of hearing and fortunately her family member was with her today to note our conversation to help relay things to her at home. She also has macular degeneration and has difficulty with her vision which makes this doubly unfortunate. With regard to the wounds currently the patient initially had an Unna boot with alginate placed over the wound areas. After seeing her primary care provider they recommended Xeroform with just wrapping over the wounds themselves not a full compression wrap. Subsequently the  patient was also placed on 2 rounds of doxycycline by her primary care provider. She had ABIs which were performed on 10/19/2018 and revealed that she does have normal ABIs with a right ABI of 0.96 with a TBI of 0.64 and a left ABI of 1.19 with a TBI of 0.52. She does have some discomfort though not too significant at this point with cleaning over the wound area. With that being said I think that she is going to require some sharp debridement at 1  of the wound locations if she is able to tolerate it. 11/07/2018 on evaluation today patient seems to be making good progress in regard to her wounds on the left lower extremity. She does have what appears to be a slight skin tear on the right lower extremity posteriorly but this is very small at this point. I do not think is going to take too much to get this to heal and affect the skin is already intact and present at this point. For that reason I believe that it is very likely that we will be able to get this to heal up without any additional complications or problems. 11/14/2018 on evaluation today patient's right lower extremity appears to be completely healed which is great news. On the left lower extremity the medial ulcer is showing signs of improvement as far as the Iodoflex helping to clean up the surface of the wound. The lateral wound is not really showing much signs of improvement nor worsening it just is at this point. Nonetheless I think she may do better with switching for the lateral wound to a dressing with collagen. 11/21/2018 on evaluation today patient appears to be doing decently well at this time 11/21/2018 on evaluation today patient actually appears to be doing better with regard to her lower extremity ulcers. The lateral ulcer actually shown signs of almost complete epithelialization which is excellent news. We have been using collagen at this site. The medial ulcer were still using Iodoflex on since that seems to be doing well to clean  up the wound bed and overall things are doing quite well. She is not quite ready for the collagen as of yet. 11/28/2018 on evaluation today patient appears to be doing well with regard to her left medial lower extremity ulcer. She has been tolerating the dressing change without complication. With that being said I am very pleased with how things appear today. She does seem to have some issues with discomfort mainly when I am cleaning the area although I do not think she is getting need the Iodoflex any longer. I think we can just switch to a collagen based dressing after today. Her wound did require some sharp debridement. Electronic Signature(s) Signed: 11/28/2018 2:21:51 PM By: Worthy Keeler PA-C Entered By: Worthy Keeler on 11/28/2018 14:21:51 -------------------------------------------------------------------------------- Physical Exam Details Patient Name: Date of Service: Jasmine, Jones 11/28/2018 1:45 PM Medical Record [id number] Patient Account Number: Date of Birth/Sex: Treating RN: [birth year] (85 y.o. Female) Baruch Gouty Primary Care Provider: Sherrie Mustache Other Clinician: Referring Provider: Treating Provider/Extender:[name], Mardi Mainland, Loni Muse in Treatment: 4 Constitutional Well-nourished and well-hydrated in no acute distress. Respiratory normal breathing without difficulty. Psychiatric this patient is able to make decisions and demonstrates good insight into disease process. Alert and Oriented x 3. pleasant and cooperative. Notes Patient's wound bed currently showed signs of good granulation at locations although she had some dry skin around the edge as well as exudate and dressing that was built up around the edge. She also had some slough noted over the middle of the wound. With that being said I was able to sharply debride away all of this today which post debridement though it did cause her little bit of discomfort the wound bed  appears to be doing excellent and I think she is ready to switch to a collagen based dressing at this time which will help to grow new skin and tissue faster. Electronic Signature(s) Signed: 11/28/2018 2:22:30 PM By: Worthy Keeler PA-C  Entered By: Worthy Keeler on 11/28/2018 14:22:30 -------------------------------------------------------------------------------- Physician Orders Details Patient Name: Date of Service: Jasmine, Jones 11/28/2018 1:45 PM Medical Record [id number] Patient Account Number: Date of Birth/Sex: Treating RN: 1925/08/20 (85 y.o. Female) Baruch Gouty Primary Care Provider: Sherrie Mustache Other Clinician: Referring Provider: Treating Provider/Extender:[name], Mardi Mainland, Loni Muse in Treatment: 4 Verbal / Phone Orders: No Diagnosis Coding ICD-10 Coding Code Description I89.0 Lymphedema, not elsewhere classified I87.2 Venous insufficiency (chronic) (peripheral) L97.822 Non-pressure chronic ulcer of other part of left lower leg with fat layer exposed S81.801A Unspecified open wound, right lower leg, initial encounter [place] (primary) hypertension F01.50 Vascular dementia without behavioral disturbance Follow-up Appointments Return Appointment in 1 week. Dressing Change Frequency Wound #1 Left,Medial Lower Leg Other: - 2 times per week Skin Barriers/Peri-Wound Care Moisturizing lotion - to leg Wound Cleansing Wound #1 Left,Medial Lower Leg Clean wound with Normal Saline. Primary Wound Dressing Wound #1 Left,Medial Lower Leg Silver Collagen - moisten with saline Secondary Dressing Wound #1 Left,Medial Lower Leg Dry Gauze Edema Control 3 Layer Compression System - Left Lower Extremity Avoid standing for long periods of time Elevate legs to the level of the heart or above for 30 minutes daily and/or when sitting, a frequency of: - throughout the day Exercise regularly Support Garment 20-30 mm/Hg pressure to: - right  leg daily [facility] skilled [facility] for wound care. - [facility] fax [phone] Electronic Signature(s) Signed: 11/30/2018 5:10:27 PM By: Baruch Gouty RN, BSN Signed: 12/25/2018 8:26:49 PM By: Worthy Keeler PA-C Previous Signature: 11/28/2018 5:03:35 PM Version By: Worthy Keeler PA-C Previous Signature: 11/28/2018 5:29:01 PM Version By: Baruch Gouty RN, BSN Entered By: Baruch Gouty on 11/30/2018 16:41:42 -------------------------------------------------------------------------------- Problem List Details Patient Name: Date of Service: Jasmine, Jones 11/28/2018 1:45 PM Medical Record [id number] Patient Account Number: Date of Birth/Sex: Treating RN: [birth year] (85 y.o. Female) Baruch Gouty Primary Care Provider: Sherrie Mustache Other Clinician: Referring Provider: Treating Provider/Extender:[name], Mardi Mainland, Loni Muse in Treatment: 4 Active Problems ICD-10 Evaluated Encounter Code Description Active Date Today Diagnosis I89.0 Lymphedema, not elsewhere classified 10/31/2018 No Yes I87.2 Venous insufficiency (chronic) (peripheral) 10/31/2018 No Yes L97.822 Non-pressure chronic ulcer of other part of left lower 10/31/2018 No Yes leg with fat layer exposed S81.801A Unspecified open wound, right lower leg, initial 11/07/2018 No Yes encounter I10 Essential (primary) hypertension 10/31/2018 No Yes F01.50 Vascular dementia without behavioral disturbance 10/31/2018 No Yes Inactive Problems Resolved Problems Electronic Signature(s) Signed: 11/28/2018 1:29:33 PM By: Worthy Keeler PA-C Entered By: Worthy Keeler on 11/28/2018 13:29:33 -------------------------------------------------------------------------------- Progress Note Details Patient Name: Date of Service: Jasmine, Jones 11/28/2018 1:45 PM Medical Record [id number] Patient Account Number: Date of Birth/Sex: Treating RN: [birth year] (85 y.o. Female) Baruch Gouty Primary Care Provider: Sherrie Mustache Other Clinician: Referring Provider: Treating Provider/Extender:[name], Mardi Mainland, Loni Muse in Treatment: 4 Subjective Chief Complaint Information obtained from Patient Left LE Ulcers History of Present Illness (HPI) 10/31/2018 on evaluation today patient presents for initial evaluation in our clinic concerning an issue that began 1 month ago. She has wounds over the left lower extremity which unfortunately have been giving her trouble. She does have a history of lymphedema as well as venous insufficiency. The patient also has hypertension and dementia. Incidentally she is also very hard of hearing and fortunately her family member was with her today to note our conversation to help relay things to her at home. She also has macular degeneration and has difficulty with her  vision which makes this doubly unfortunate. With regard to the wounds currently the patient initially had an Unna boot with alginate placed over the wound areas. After seeing her primary care provider they recommended Xeroform with just wrapping over the wounds themselves not a full compression wrap. Subsequently the patient was also placed on 2 rounds of doxycycline by her primary care provider. She had ABIs which were performed on 10/19/2018 and revealed that she does have normal ABIs with a right ABI of 0.96 with a TBI of 0.64 and a left ABI of 1.19 with a TBI of 0.52. She does have some discomfort though not too significant at this point with cleaning over the wound area. With that being said I think that she is going to require some sharp debridement at 1 of the wound locations if she is able to tolerate it. 11/07/2018 on evaluation today patient seems to be making good progress in regard to her wounds on the left lower extremity. She does have what appears to be a slight skin tear on the right lower extremity posteriorly but this is very small at this point. I do  not think is going to take too much to get this to heal and affect the skin is already intact and present at this point. For that reason I believe that it is very likely that we will be able to get this to heal up without any additional complications or problems. 11/14/2018 on evaluation today patient's right lower extremity appears to be completely healed which is great news. On the left lower extremity the medial ulcer is showing signs of improvement as far as the Iodoflex helping to clean up the surface of the wound. The lateral wound is not really showing much signs of improvement nor worsening it just is at this point. Nonetheless I think she may do better with switching for the lateral wound to a dressing with collagen. 11/21/2018 on evaluation today patient appears to be doing decently well at this time 11/21/2018 on evaluation today patient actually appears to be doing better with regard to her lower extremity ulcers. The lateral ulcer actually shown signs of almost complete epithelialization which is excellent news. We have been using collagen at this site. The medial ulcer were still using Iodoflex on since that seems to be doing well to clean up the wound bed and overall things are doing quite well. She is not quite ready for the collagen as of yet. 11/28/2018 on evaluation today patient appears to be doing well with regard to her left medial lower extremity ulcer. She has been tolerating the dressing change without complication. With that being said I am very pleased with how things appear today. She does seem to have some issues with discomfort mainly when I am cleaning the area although I do not think she is getting need the Iodoflex any longer. I think we can just switch to a collagen based dressing after today. Her wound did require some sharp debridement. Patient History Information obtained from Patient. Family History Heart Disease - Mother,Father, No family history of Cancer,  Diabetes, Hereditary Spherocytosis, Hypertension, Kidney Disease, Lung Disease, Seizures, Stroke, Thyroid Problems, Tuberculosis. Social History Never smoker, Marital Status - Widowed, Alcohol Use - Never, Drug Use - No History, Caffeine Use - Daily. Medical History Eyes Denies history of Cataracts, Glaucoma, Optic Neuritis Ear/Nose/Mouth/Throat Denies history of Chronic sinus problems/congestion, Middle ear problems Hematologic/Lymphatic Denies history of Anemia, Hemophilia, Human Immunodeficiency Virus, Lymphedema, Sickle Cell Disease Respiratory Denies history  of Aspiration, Asthma, Chronic Obstructive Pulmonary Disease (COPD), Pneumothorax, Sleep Apnea, Tuberculosis Cardiovascular Patient has history of Hypertension Denies history of Angina, Arrhythmia, Congestive Heart Failure, Coronary Artery Disease, Deep Vein Thrombosis, Hypotension, Myocardial Infarction, Peripheral Arterial Disease, Peripheral Venous Disease, Phlebitis, Vasculitis Gastrointestinal Denies history of Cirrhosis , Colitis, Crohnoos, Hepatitis A, Hepatitis B, Hepatitis C Endocrine Denies history of Type I Diabetes, Type II Diabetes Genitourinary Denies history of End Stage Renal Disease Immunological Denies history of Lupus Erythematosus, Raynaudoos, Scleroderma Integumentary (Skin) Denies history of History of Burn Musculoskeletal Denies history of Gout, Rheumatoid Arthritis, Osteoarthritis, Osteomyelitis Neurologic Patient has history of Dementia Denies history of Neuropathy, Quadriplegia, Paraplegia, Seizure Disorder Oncologic Denies history of Received Chemotherapy, Received Radiation Psychiatric Denies history of Anorexia/bulimia, Confinement Anxiety Review of Systems (ROS) Constitutional Symptoms (General Health) Denies complaints or symptoms of Fatigue, Fever, Chills, Marked Weight Change. Respiratory Denies complaints or symptoms of Chronic or frequent coughs, Shortness of  Breath. Cardiovascular Denies complaints or symptoms of Chest pain. Psychiatric Denies complaints or symptoms of Claustrophobia, Suicidal. Objective Constitutional Well-nourished and well-hydrated in no acute distress. Vitals Time Taken: 1:32 PM, Height: 63 in, Weight: 107 lbs, BMI: 19, Temperature: 98.3 F, Pulse: 69 bpm, Respiratory Rate: 16 breaths/min, Blood Pressure: 109/54 mmHg. Respiratory normal breathing without difficulty. Psychiatric this patient is able to make decisions and demonstrates good insight into disease process. Alert and Oriented x 3. pleasant and cooperative. General Notes: Patient's wound bed currently showed signs of good granulation at locations although she had some dry skin around the edge as well as exudate and dressing that was built up around the edge. She also had some slough noted over the middle of the wound. With that being said I was able to sharply debride away all of this today which post debridement though it did cause her little bit of discomfort the wound bed appears to be doing excellent and I think she is ready to switch to a collagen based dressing at this time which will help to grow new skin and tissue faster. Integumentary (Hair, Skin) Wound #1 status is Open. Original cause of wound was Gradually Appeared. The wound is located on the Left,Medial Lower Leg. The wound measures 2.5cm length x 2.4cm width x 0.1cm depth; 4.712cm^2 area and 0.471cm^3 volume. There is Fat Layer (Subcutaneous Tissue) Exposed exposed. There is no tunneling or undermining noted. There is a medium amount of serosanguineous drainage noted. The wound margin is distinct with the outline attached to the wound base. There is medium (34-66%) pink granulation within the wound bed. There is a medium (34-66%) amount of necrotic tissue within the wound bed including Adherent Slough. Wound #2 status is Healed - Epithelialized. Original cause of wound was Gradually Appeared. The  wound is located on the Left,Lateral Lower Leg. The wound measures 0cm length x 0cm width x 0cm depth; 0cm^2 area and 0cm^3 volume. There is a none present amount of drainage noted. The wound margin is distinct with the outline attached to the wound base. There is no granulation within the wound bed. There is a large (67-100%) amount of necrotic tissue within the wound bed including Eschar. Assessment Active Problems ICD-10 Lymphedema, not elsewhere classified Venous insufficiency (chronic) (peripheral) Non-pressure chronic ulcer of other part of left lower leg with fat layer exposed Unspecified open wound, right lower leg, initial encounter Essential (primary) hypertension Vascular dementia without behavioral disturbance Procedures Wound #1 Pre-procedure diagnosis of Wound #1 is a Venous Leg Ulcer located on the Left,Medial Lower Leg .Severity  of Tissue Pre Debridement is: Fat layer exposed. There was a Excisional Skin/Subcutaneous Tissue Debridement with a total area of 6 sq cm performed by Worthy Keeler, PA. With the following instrument(s): Curette to remove Viable and Non-Viable tissue/material. Material removed includes Subcutaneous Tissue, Slough, Skin: Epidermis, and Fibrin/Exudate after achieving pain control using Lidocaine 5% topical ointment. No specimens were taken. A time out was conducted at 14:10, prior to the start of the procedure. A Minimum amount of bleeding was controlled with Pressure. The procedure was tolerated well with a pain level of 6 throughout and a pain level of 3 following the procedure. Post Debridement Measurements: 2.5cm length x 1.7cm width x 0.1cm depth; 0.334cm^3 volume. Character of Wound/Ulcer Post Debridement is improved. Severity of Tissue Post Debridement is: Fat layer exposed. Post procedure Diagnosis Wound #1: Same as Pre-Procedure Pre-procedure diagnosis of Wound #1 is a Venous Leg Ulcer located on the Left,Medial Lower Leg . There was  a Three Layer Compression Therapy Procedure by Deon Pilling, RN. Post procedure Diagnosis Wound #1: Same as Pre-Procedure Plan Follow-up Appointments: Return Appointment in 1 week. Dressing Change Frequency: Wound #1 Left,Medial Lower Leg: Other: - 2 times per week Skin Barriers/Peri-Wound Care: Moisturizing lotion - to leg Wound Cleansing: Wound #1 Left,Medial Lower Leg: Clean wound with Normal Saline. Primary Wound Dressing: Wound #1 Left,Medial Lower Leg: Silver Collagen - moisten with saline Secondary Dressing: Wound #1 Left,Medial Lower Leg: Dry Gauze Edema Control: 3 Layer Compression System - Left Lower Extremity Avoid standing for long periods of time Elevate legs to the level of the heart or above for 30 minutes daily and/or when sitting, a frequency of: - throughout the day Exercise regularly Support Garment 20-30 mm/Hg pressure to: - right leg daily Home Health: [facility] for wound care. - [facility] fax [phone] 1. I would recommend currently that we switch to collagen/Prisma for the patient to see if we can get this wound to heal faster at both sites. 2. I am also going to suggest that we continue with a 3 layer compression wrap as I feel like this is helpful for her. 3. Also going to suggest that she continue with the compression garment for her right lower extremity that seems to be doing well there apparently was a small area of weeping although that has resolved her daughters can keep an eye on this and let me know if anything changes we did have a look at it today but nothing was open. We will see patient back for reevaluation in 1 week here in the clinic. If anything worsens or changes patient will contact our office for additional recommendations. Electronic Signature(s) Signed: 11/30/2018 5:10:27 PM By: Baruch Gouty RN, BSN Signed: 12/25/2018 8:26:49 PM By: Worthy Keeler PA-C Previous Signature: 11/28/2018 2:23:18 PM  Version By: Worthy Keeler PA-C Entered By: Baruch Gouty on 11/30/2018 16:41:57 -------------------------------------------------------------------------------- HxROS Details Patient Name: Date of Service: Jasmine, Jones 11/28/2018 1:45 PM Medical Record [id number] Patient Account Number: Date of Birth/Sex: Treating RN: 04-Feb-1926 (85 y.o. Female) Baruch Gouty Primary Care Provider: Sherrie Mustache Other Clinician: Referring Provider: Treating Provider/Extender:[name], Mardi Mainland, Loni Muse in Treatment: 4 Information Obtained From Patient Constitutional Symptoms (General Health) Complaints and Symptoms: Negative for: Fatigue; Fever; Chills; Marked Weight Change Respiratory Complaints and Symptoms: Negative for: Chronic or frequent coughs; Shortness of Breath Medical History: Negative for: Aspiration; Asthma; Chronic Obstructive Pulmonary Disease (COPD); Pneumothorax; Sleep Apnea; Tuberculosis Cardiovascular Complaints and Symptoms: Negative for: Chest pain Medical  History: Positive for: Hypertension Negative for: Angina; Arrhythmia; Congestive Heart Failure; Coronary Artery Disease; Deep Vein Thrombosis; Hypotension; Myocardial Infarction; Peripheral Arterial Disease; Peripheral Venous Disease; Phlebitis; Vasculitis Psychiatric Complaints and Symptoms: Negative for: Claustrophobia; Suicidal Medical History: Negative for: Anorexia/bulimia; Confinement Anxiety Eyes Medical History: Negative for: Cataracts; Glaucoma; Optic Neuritis Ear/Nose/Mouth/Throat Medical History: Negative for: Chronic sinus problems/congestion; Middle ear problems Hematologic/Lymphatic Medical History: Negative for: Anemia; Hemophilia; Human Immunodeficiency Virus; Lymphedema; Sickle Cell Disease Gastrointestinal Medical History: Negative for: Cirrhosis ; Colitis; Crohns; Hepatitis A; Hepatitis B; Hepatitis C Endocrine Medical History: Negative for: Type I Diabetes;  Type II Diabetes Genitourinary Medical History: Negative for: End Stage Renal Disease Immunological Medical History: Negative for: Lupus Erythematosus; Raynauds; Scleroderma Integumentary (Skin) Medical History: Negative for: History of Burn Musculoskeletal Medical History: Negative for: Gout; Rheumatoid Arthritis; Osteoarthritis; Osteomyelitis Neurologic Medical History: Positive for: Dementia Negative for: Neuropathy; Quadriplegia; Paraplegia; Seizure Disorder Oncologic Medical History: Negative for: Received Chemotherapy; Received Radiation Immunizations Pneumococcal Vaccine: Received Pneumococcal Vaccination: No Implantable Devices None Family and Social History Cancer: No; Diabetes: No; Heart Disease: Yes - Mother,Father; Hereditary Spherocytosis: No; Hypertension: No; Kidney Disease: No; Lung Disease: No; Seizures: No; Stroke: No; Thyroid Problems: No; Tuberculosis: No; Never smoker; Marital Status - Widowed; Alcohol Use: Never; Drug Use: No History; Caffeine Use: Daily; Financial Concerns: No; Food, Clothing or Shelter Needs: No; Support System Lacking: No; Transportation Concerns: No Physician Affirmation I have reviewed and agree with the above information. Electronic Signature(s) Signed: 11/28/2018 5:03:35 PM By: Worthy Keeler PA-C Signed: 11/28/2018 5:29:01 PM By: Baruch Gouty RN, BSN Entered By: Worthy Keeler on 11/28/2018 14:22:10 -------------------------------------------------------------------------------- SuperBill Details Patient Name: Date of Service: Ambriel, Wallen 11/28/2018 Medical Record [id number] Patient Account Number: Date of Birth/Sex: Treating RN: Aug 30, 1925 (85 y.o. Female) Baruch Gouty Primary Care Provider: Sherrie Mustache Other Clinician: Referring Provider: Treating Provider/Extender:[name], Mardi Mainland, Loni Muse in Treatment: 4 Diagnosis Coding ICD-10 Codes Code Description I89.0 Lymphedema, not  elsewhere classified I87.2 Venous insufficiency (chronic) (peripheral) L97.822 Non-pressure chronic ulcer of other part of left lower leg with fat layer exposed S81.801A Unspecified open wound, right lower leg, initial encounter I10 Essential (primary) hypertension F01.50 Vascular dementia without behavioral disturbance Facility Procedures CPT4 Code Description: [id number] - DEB SUBQ TISSUE 20 SQ CM/< ICD-10 Diagnosis Description L97.822 Non-pressure chronic ulcer of other part of left lower leg w Modifier: ith fat laye Quantity: 1 r exposed Physician Procedures Electronic Signature(s) Signed: 11/28/2018 2:23:27 PM By: Worthy Keeler PA-C Entered By: Worthy Keeler on 11/28/2018 14:23:27

## 2018-12-27 NOTE — ED Triage Notes (Signed)
Patient is from [facility]. She experienced an unwitnessed fall and was found sitting in the chair. She now complains of right hip pain that radiates into the leg. EMS noted shortening and rotation of that leg. 100 mcg of fentanyl was administered to the patient.    EMS vitals:  144/88 BP 80 HR 98% O2 sat on room air 18 Resp Rate 168 CBG

## 2018-12-27 NOTE — ED Provider Notes (Signed)
[facility] DEPT Provider Note   CSN: [id number] Arrival date & time: 12/27/18  1949     History   Chief Complaint Chief Complaint  Patient presents with   Fall    HPI Jasmine Jones is a 85 y.o. female hx of afib not on blood thinners, GERD, HTN, HL, here presenting with fall.  Patient is from [facility] and had an unwitnessed fall and was noted to have obvious right hip deformity.  Unclear if she hit her head or not.  She was given fentanyl prior to arrival .  Patient is demented and unable to give much history.      The history is provided by the patient.  Level V caveat- dementia   Past Medical History:  Diagnosis Date   A-fib ([employer])    Anemia    Anxiety    Dry skin    on ears   Gastroesophageal reflux    Hearing loss    significant  uses hearing aids   High blood pressure    Hyperlipidemia    Loss of appetite    Loss of smell    Macular degeneration    Malnutrition (H[facility])    Osteoporosis    Persistent dry cough    Skin cancer    basal / squamous   Slow transit constipation    abstracted from new patient packet    Vitamin D deficiency 01/26/2018    Patient Active Problem List   Diagnosis Date Noted   Closed right hip fracture, initial encounter ([place]) 12/27/2018   GOITER, MULTINODULAR 08/15/2007   CHEST PAIN 08/15/2007   HYPERLIPIDEMIA 08/14/2007   INSOMNIA, CHRONIC 08/14/2007   Essential hypertension 08/14/2007   ATRIAL FIBRILLATION 08/14/2007   Osteoarthritis 08/14/2007   OSTEOPOROSIS 08/14/2007    No past surgical history on file.   OB History   No obstetric history on file.      Home Medications    Prior to Admission medications   Medication Sig Start Date End Date Taking? Authorizing Provider  acetaminophen (TYLENOL) 325 MG tablet To take tylenol 650 mg midday, can take every 6 hours as needed pain max 3000 mg in 24/hours 11/28/18  Yes Eubanks, Carlos American, NP  ALPRAZolam  Duanne Moron) 0.25 MG tablet Take 0.25 mg by mouth every 6 (six) hours as needed for anxiety.   Yes [provider]  busPIRone (BUSPAR) 10 MG tablet Take 10 mg by mouth 3 (three) times daily.   Yes [provider]  calcium carbonate (OS-CAL) 600 MG TABS tablet Take 600 mg by mouth every 12 (twelve) hours.    Yes [provider]  cholecalciferol (VITAMIN D) 25 MCG (1000 UT) tablet Take 2,000 Units by mouth daily.   Yes [provider]  diclofenac sodium (VOLTAREN) 1 % GEL Apply 1 g topically. 3-4 times daily   Yes [provider]  docusate sodium (COLACE) 100 MG capsule Take 100 mg by mouth every 12 (twelve) hours as needed for mild constipation.   Yes [provider]  Ensure (ENSURE) Take 237 mLs by mouth daily.   Yes [provider]  famotidine (PEPCID) 20 MG tablet Take 20 mg by mouth at bedtime.   Yes [provider]  hydrochlorothiazide (HYDRODIURIL) 25 MG tablet Take 25 mg by mouth daily.   Yes [provider]  loperamide (IMODIUM) 2 MG capsule Take 2 mg by mouth as needed for diarrhea or loose stools.   Yes [provider]  memantine (NAMENDA XR)  28 MG CP24 24 hr capsule Take 28 mg by mouth daily.   Yes [provider]  metoprolol succinate (TOPROL-XL) 25 MG 24 hr tablet Take 25 mg by mouth daily.    Yes [provider]  Multiple Vitamin (DAILY VITE) TABS Take 1 tablet by mouth daily.   Yes [provider]  Multiple Vitamins-Minerals (EQ VISION FORMULA 50+ PO) Take 1 tablet by mouth every 12 (twelve) hours.   Yes [provider]  Polyethyl Glycol-Propyl Glycol (SYSTANE) 0.4-0.3 % GEL ophthalmic gel Place 1 application into both eyes as needed. 2 drops into affected eyes every hour for dry eyes/itching   Yes [provider]  potassium chloride (K-DUR) 10 MEQ tablet Take 3 tablets (30 mEq total) by mouth daily. 10/25/18 12/27/18 Yes [name], NP  sertraline  (ZOLOFT) 50 MG tablet Take 50 mg by mouth daily.   Yes [provider]  simvastatin (ZOCOR) 20 MG tablet Take 20 mg by mouth daily.   Yes [provider]  torsemide (DEMADEX) 20 MG tablet Take 1 tablet (20 mg total) by mouth daily. 10/25/18  Yes [name], NP  traMADol (ULTRAM) 50 MG tablet Take 1 tablet (50 mg total) by mouth 2 (two) times daily. 12/20/18  Yes [name], NP    Family History Family History  Problem Relation Age of Onset   Atrial fibrillation Sister    Diabetes Sister    Osteoarthritis Sister    Osteopenia Sister    Mental retardation Sister     Social History Social History   Tobacco Use   Smoking status: Never Smoker   Smokeless tobacco: Never Used  Substance Use Topics   Alcohol use: No    Alcohol/week: 0.0 standard drinks   Drug use: No     Allergies   Patient has no known allergies.   Review of Systems Review of Systems  Musculoskeletal:       R hip pain   All other systems reviewed and are negative.    Physical Exam Updated Vital Signs BP 126/77    Pulse (!) 103    Temp 97.7 F (36.5 C) (Oral)    Resp 20    SpO2 96%   Physical Exam Vitals signs and nursing note reviewed.  HENT:     Head: Normocephalic.     Mouth/Throat:     Mouth: Mucous membranes are moist.  Eyes:     Extraocular Movements: Extraocular movements intact.     Pupils: Pupils are equal, round, and reactive to light.  Neck:     Musculoskeletal: Normal range of motion.  Cardiovascular:     Rate and Rhythm: Normal rate.     Pulses: Normal pulses.  Pulmonary:     Effort: Pulmonary effort is normal.  Abdominal:     General: Abdomen is flat.  Musculoskeletal:     Comments: Obvious R hip deformity with shortened and rotation. Mild R knee swelling as well. Able to wiggle toes.   Skin:    General: Skin is warm.     Capillary Refill: Capillary refill takes less than 2 seconds.  Neurological:     General: No focal deficit  present.     Mental Status: She is alert.  Psychiatric:        Mood and Affect: Mood normal.      ED Treatments / Results  Labs (all labs ordered are listed, but only abnormal results are displayed) Labs Reviewed  CBC WITH DIFFERENTIAL/PLATELET - Abnormal;  Notable for the following components:      Result Value   WBC 11.7 (*)    Neutro Abs 9.1 (*)    All other components within normal limits  COMPREHENSIVE METABOLIC PANEL - Abnormal; Notable for the following components:   Chloride 93 (*)    Glucose, Bld 120 (*)    BUN 27 (*)    AST 51 (*)    Total Bilirubin 1.9 (*)    All other components within normal limits  SARS CORONAVIRUS 2 BY RT PCR (HOSPITAL ORDER, [facility])  PROTIME-INR    EKG EKG Interpretation  Date/Time:  Thursday December 27 2018 20:12:47 EDT Ventricular Rate:  76 PR Interval:    QRS Duration: 148 QT Interval:  443 QTC Calculation: 499 R Axis:   -82 Text Interpretation:  Sinus rhythm Atrial premature complexes RBBB and LAFB No significant change since last tracing Confirmed by Wandra Arthurs [phone]) on 12/27/2018 8:41:16 PM   Radiology Dg Chest 1 View  Result Date: 12/27/2018 CLINICAL DATA:  85 year old female with history of fall.  Hip pain. EXAM: CHEST  1 VIEW COMPARISON:  Chest x-ray 11/13/2016. FINDINGS: Lung volumes are normal. No consolidative airspace disease. No pleural effusions. No pneumothorax. No pulmonary nodule or mass noted. Pulmonary vasculature and the cardiomediastinal silhouette are within normal limits. Atherosclerotic calcifications in the thoracic aorta. IMPRESSION: 1.  No radiographic evidence of acute cardiopulmonary disease. 2. Aortic atherosclerosis. Electronically Signed   By: Vinnie Langton M.D.   On: 12/27/2018 21:00   Dg Pelvis 1-2 Views  Result Date: 12/27/2018 CLINICAL DATA:  Fall EXAM: PELVIS - 1-2 VIEW COMPARISON:  None. FINDINGS: There is a comminuted impacted fracture of the right proximal  femur involving the greater and lesser trochanters. The femoral head still articulates with the acetabulum. No other definite fracture seen. IMPRESSION: Comminuted impacted proximal right femoral fracture involving the greater and lesser trochanters. Electronically Signed   By: Prudencio Pair M.D.   On: 12/27/2018 20:59   Ct Head Wo Contrast  Result Date: 12/27/2018 CLINICAL DATA:  Head trauma.  Fall EXAM: CT HEAD WITHOUT CONTRAST CT CERVICAL SPINE WITHOUT CONTRAST TECHNIQUE: Multidetector CT imaging of the head and cervical spine was performed following the standard protocol without intravenous contrast. Multiplanar CT image reconstructions of the cervical spine were also generated. COMPARISON:  CT head 09/17/2018 FINDINGS: CT HEAD FINDINGS Brain: Moderate atrophy. Chronic ischemic changes in the white matter. Chronic infarct left frontal lobe unchanged. Negative for acute infarct.  Negative for acute hemorrhage or mass Vascular: Negative for hyperdense vessel Skull:   Negative for fracture Sinuses/Orbits: Bubbly secretions in the right sphenoid sinus otherwise clear sinuses. Bilateral cataract surgery. Other: None CT CERVICAL SPINE FINDINGS Alignment: Mild retrolisthesis C3-4. Mild anterolisthesis C7-T1, T1-T2, T2-3 Skull base and vertebrae: Negative for fracture or mass Soft tissues and spinal canal: 14 mm right thyroid nodule. No adenopathy. Disc levels: Multilevel disc and facet degeneration throughout the cervical spine. No significant spinal stenosis. Upper chest: Irregular soft tissue density with associated calcification left upper lobe anteriorly. This was present on prior chest CT October 22, 2017. Probable scarring Other: None IMPRESSION: Atrophy and chronic ischemic changes. No acute intracranial abnormality Negative for cervical spine fracture.  Cervical spondylosis 14 mm right thyroid nodule. No further imaging is indicated based on size of nodule and patient age. Irregular soft tissue density with  calcification left upper lobe anteriorly, probable scarring. Electronically Signed   By: Franchot Gallo M.D.  On: 12/27/2018 20:40   Ct Cervical Spine Wo Contrast  Result Date: 12/27/2018 CLINICAL DATA:  Head trauma.  Fall EXAM: CT HEAD WITHOUT CONTRAST CT CERVICAL SPINE WITHOUT CONTRAST TECHNIQUE: Multidetector CT imaging of the head and cervical spine was performed following the standard protocol without intravenous contrast. Multiplanar CT image reconstructions of the cervical spine were also generated. COMPARISON:  CT head 09/17/2018 FINDINGS: CT HEAD FINDINGS Brain: Moderate atrophy. Chronic ischemic changes in the white matter. Chronic infarct left frontal lobe unchanged. Negative for acute infarct.  Negative for acute hemorrhage or mass Vascular: Negative for hyperdense vessel Skull:   Negative for fracture Sinuses/Orbits: Bubbly secretions in the right sphenoid sinus otherwise clear sinuses. Bilateral cataract surgery. Other: None CT CERVICAL SPINE FINDINGS Alignment: Mild retrolisthesis C3-4. Mild anterolisthesis C7-T1, T1-T2, T2-3 Skull base and vertebrae: Negative for fracture or mass Soft tissues and spinal canal: 14 mm right thyroid nodule. No adenopathy. Disc levels: Multilevel disc and facet degeneration throughout the cervical spine. No significant spinal stenosis. Upper chest: Irregular soft tissue density with associated calcification left upper lobe anteriorly. This was present on prior chest CT October 22, 2017. Probable scarring Other: None IMPRESSION: Atrophy and chronic ischemic changes. No acute intracranial abnormality Negative for cervical spine fracture.  Cervical spondylosis 14 mm right thyroid nodule. No further imaging is indicated based on size of nodule and patient age. Irregular soft tissue density with calcification left upper lobe anteriorly, probable scarring. Electronically Signed   By: Franchot Gallo M.D.   On: 12/27/2018 20:40   Dg Knee Complete 4 Views Right  Result  Date: 12/27/2018 CLINICAL DATA:  Fall EXAM: RIGHT KNEE - COMPLETE 4+ VIEW COMPARISON:  None. FINDINGS: No definite fracture or dislocation. Tricompartmental osteoarthritis is seen most notable in the medial compartment with joint space loss and marginal osteophyte formation. No large knee joint effusion. Dense vascular calcifications are noted. IMPRESSION: No acute osseous abnormality. Electronically Signed   By: Prudencio Pair M.D.   On: 12/27/2018 21:00   Dg Femur Min 2 Views Right  Result Date: 12/27/2018 CLINICAL DATA:  Fall EXAM: RIGHT FEMUR 2 VIEWS COMPARISON:  None. FINDINGS: Comminuted impacted fracture of the proximal right femoral shaft involving the greater and lesser trochanters. Femoral head is still seated in the acetabulum. A healed fracture deformity of the fibula. 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 Dense vascular calcifications are noted. There is diffuse osteopenia. IMPRESSION: Comminuted impacted fracture of the proximal femoral shaft above the greater lesser trochanter. Electronically Signed   By: Prudencio Pair M.D.   On: 12/27/2018 21:01    Procedures Procedures (including critical care time)  Medications Ordered in ED Medications  morphine 4 MG/ML injection 4 mg (4 mg Intravenous Given 12/27/18 2050)  HYDROmorphone (DILAUDID) injection 1 mg (1 mg Intravenous Given 12/27/18 2214)     Initial Impression / Assessment and Plan / ED Course  I have reviewed the triage vital signs and the nursing notes.  Pertinent labs & imaging results that were available during my care of the patient were reviewed by me and considered in my medical decision making (see chart for details).       Jasmine Jones is a 86 y.o. female here with R hip pain s/p fall. Concerned for possible hip fracture. Will get preop labs, hip xrays, CT head/neck.   10:42 PM CT head/neck unremarkable. Xray showed femoral neck fracture. Dr. Berenice Primas will see patient in the morning.  Hospitalist to admit for pain control.  Final Clinical Impressions(s) / ED Diagnoses   Final diagnoses:  None    ED Discharge Orders    None       Drenda Freeze, MD 12/27/18 2243

## 2018-12-27 NOTE — H&P (View-Only) (Signed)
Reason for Consult:right intertrochanteric hip fracture Referring Physician: hospitalists  Jasmine Jones is an 85 y.o. female.  HPI: this is a 85 year old female who fell earlier the day.  She was admitted to the hospital with a right intratrochanteric hip fracture.  We're consult for management of her fracture.  I reviewed her x-rays and had a discussion with the emergency room physicians.  She will need open reduction internal fixation with an intramedullary rod.  She'll be cleared by the internal medicine service and will take her to the operating room when operative time is available tomorrow.  The patient is in a cervical collar for unknown reasons.  She is completely hard of hearing and I am unable to communicate with her.  Past Medical History:  Diagnosis Date  . A-fib ([place])   . Anemia   . Anxiety   . Dry skin    on ears  . Gastroesophageal reflux   . Hearing loss    significant  uses hearing aids  . High blood pressure   . Hyperlipidemia   . Loss of appetite   . Loss of smell   . Macular degeneration   . Malnutrition ([place])   . Osteoporosis   . Persistent dry cough   . Skin cancer    basal / squamous  . Slow transit constipation    abstracted from new patient packet   . Vitamin D deficiency 01/26/2018    No past surgical history on file.  Family History  Problem Relation Age of Onset  . Atrial fibrillation Sister   . Diabetes Sister   . Osteoarthritis Sister   . Osteopenia Sister   . Mental retardation Sister     Social History:  reports that she has never smoked. She has never used smokeless tobacco. She reports that she does not drink alcohol or use drugs.  Allergies: No Known Allergies  Medications: I have reviewed the patient's current medications.  Results for orders placed or performed during the hospital encounter of 12/27/18 (from the past 48 hour(s))  CBC with Differential/Platelet     Status: Abnormal   Collection Time: 12/27/18  8:14 PM   Result Value Ref Range   WBC 11.7 (H) 4.0 - 10.5 K/uL   RBC 4.78 3.87 - 5.11 MIL/uL   Hemoglobin 12.8 12.0 - 15.0 g/dL   HCT 40.3 36.0 - 46.0 %   MCV 84.3 80.0 - 100.0 fL   MCH 26.8 26.0 - 34.0 pg   MCHC 31.8 30.0 - 36.0 g/dL   RDW 13.2 11.5 - 15.5 %   Platelets 213 150 - 400 K/uL   nRBC 0.0 0.0 - 0.2 %   Neutrophils Relative % 78 %   Neutro Abs 9.1 (H) 1.7 - 7.7 K/uL   Lymphocytes Relative 14 %   Lymphs Abs 1.6 0.7 - 4.0 K/uL   Monocytes Relative 7 %   Monocytes Absolute 0.8 0.1 - 1.0 K/uL   Eosinophils Relative 0 %   Eosinophils Absolute 0.0 0.0 - 0.5 K/uL   Basophils Relative 0 %   Basophils Absolute 0.0 0.0 - 0.1 K/uL   Immature Granulocytes 1 %   Abs Immature Granulocytes 0.07 0.00 - 0.07 K/uL    Comment: Performed at [facility], [place] [address], [place], [place] [address]  Comprehensive metabolic panel     Status: Abnormal   Collection Time: 12/27/18  8:14 PM  Result Value Ref Range   Sodium 137 135 - 145 mmol/L   Potassium 4.2 3.5 -  5.1 mmol/L   Chloride 93 (L) 98 - 111 mmol/L   CO2 30 22 - 32 mmol/L   Glucose, Bld 120 (H) 70 - 99 mg/dL   BUN 27 (H) 8 - 23 mg/dL   Creatinine, Ser 0.84 0.44 - 1.00 mg/dL   Calcium 9.3 8.9 - 10.3 mg/dL   Total Protein 6.5 6.5 - 8.1 g/dL   Albumin 3.6 3.5 - 5.0 g/dL   AST 51 (H) 15 - 41 U/L   ALT 12 0 - 44 U/L   Alkaline Phosphatase 82 38 - 126 U/L   Total Bilirubin 1.9 (H) 0.3 - 1.2 mg/dL   GFR calc non Af Amer >60 >60 mL/min   GFR calc Af Amer >60 >60 mL/min   Anion gap 14 5 - 15    Comment: Performed at [facility], [place] [address], [place], [place] [address]  SARS Coronavirus 2 by RT PCR (hospital order, performed in [facility] hospital lab) Nasopharyngeal Nasopharyngeal Swab     Status: None   Collection Time: 12/27/18  9:29 PM   Specimen: Nasopharyngeal Swab  Result Value Ref Range   SARS Coronavirus 2 NEGATIVE NEGATIVE    Comment: (NOTE) If result is NEGATIVE SARS-CoV-2 target  nucleic acids are NOT DETECTED. The SARS-CoV-2 RNA is generally detectable in upper and lower  respiratory specimens during the acute phase of infection. The lowest  concentration of SARS-CoV-2 viral copies this assay can detect is 250  copies / mL. A negative result does not preclude SARS-CoV-2 infection  and should not be used as the sole basis for treatment or other  patient management decisions.  A negative result may occur with  improper specimen collection / handling, submission of specimen other  than nasopharyngeal swab, presence of viral mutation(s) within the  areas targeted by this assay, and inadequate number of viral copies  (<250 copies / mL). A negative result must be combined with clinical  observations, patient history, and epidemiological information. If result is POSITIVE SARS-CoV-2 target nucleic acids are DETECTED. The SARS-CoV-2 RNA is generally detectable in upper and lower  respiratory specimens dur ing the acute phase of infection.  Positive  results are indicative of active infection with SARS-CoV-2.  Clinical  correlation with patient history and other diagnostic information is  necessary to determine patient infection status.  Positive results do  not rule out bacterial infection or co-infection with other viruses. If result is PRESUMPTIVE POSTIVE SARS-CoV-2 nucleic acids MAY BE PRESENT.   A presumptive positive result was obtained on the submitted specimen  and confirmed on repeat testing.  While 2019 novel coronavirus  (SARS-CoV-2) nucleic acids may be present in the submitted sample  additional confirmatory testing may be necessary for epidemiological  and / or clinical management purposes  to differentiate between  SARS-CoV-2 and other Sarbecovirus currently known to infect humans.  If clinically indicated additional testing with an alternate test  methodology [phone]) is advised. The SARS-CoV-2 RNA is generally  detectable in upper and lower  respiratory sp ecimens during the acute  phase of infection. The expected result is Negative. Fact Sheet for Patients:  StrictlyIdeas.no Fact Sheet for Healthcare Providers: BankingDealers.co.za This test is not yet approved or cleared by the [place] FDA and has been authorized for detection and/or diagnosis of SARS-CoV-2 by FDA under an Emergency Use Authorization (EUA).  This EUA will remain in effect (meaning this test can be used) for the duration of the COVID-19 declaration under Section 564(b)(1) of the  Act, 21 U.S.C. section 360bbb-3(b)(1), unless the authorization is terminated or revoked sooner. Performed at [facility], [place] [address] [place], [place] [address]     Dg Chest 1 View  Result Date: 12/27/2018 CLINICAL DATA:  85 year old female with history of fall.  Hip pain. EXAM: CHEST  1 VIEW COMPARISON:  Chest x-ray 11/13/2016. FINDINGS: Lung volumes are normal. No consolidative airspace disease. No pleural effusions. No pneumothorax. No pulmonary nodule or mass noted. Pulmonary vasculature and the cardiomediastinal silhouette are within normal limits. Atherosclerotic calcifications in the thoracic aorta. IMPRESSION: 1.  No radiographic evidence of acute cardiopulmonary disease. 2. Aortic atherosclerosis. Electronically Signed   By: Vinnie Langton M.D.   On: 12/27/2018 21:00   Dg Pelvis 1-2 Views  Result Date: 12/27/2018 CLINICAL DATA:  Fall EXAM: PELVIS - 1-2 VIEW COMPARISON:  None. FINDINGS: There is a comminuted impacted fracture of the right proximal femur involving the greater and lesser trochanters. The femoral head still articulates with the acetabulum. No other definite fracture seen. IMPRESSION: Comminuted impacted proximal right femoral fracture involving the greater and lesser trochanters. Electronically Signed   By: Prudencio Pair M.D.   On: 12/27/2018 20:59   Ct Head Wo Contrast  Result  Date: 12/27/2018 CLINICAL DATA:  Head trauma.  Fall EXAM: CT HEAD WITHOUT CONTRAST CT CERVICAL SPINE WITHOUT CONTRAST TECHNIQUE: Multidetector CT imaging of the head and cervical spine was performed following the standard protocol without intravenous contrast. Multiplanar CT image reconstructions of the cervical spine were also generated. COMPARISON:  CT head 09/17/2018 FINDINGS: CT HEAD FINDINGS Brain: Moderate atrophy. Chronic ischemic changes in the white matter. Chronic infarct left frontal lobe unchanged. Negative for acute infarct.  Negative for acute hemorrhage or mass Vascular: Negative for hyperdense vessel Skull:   Negative for fracture Sinuses/Orbits: Bubbly secretions in the right sphenoid sinus otherwise clear sinuses. Bilateral cataract surgery. Other: None CT CERVICAL SPINE FINDINGS Alignment: Mild retrolisthesis C3-4. Mild anterolisthesis C7-T1, T1-T2, T2-3 Skull base and vertebrae: Negative for fracture or mass Soft tissues and spinal canal: 14 mm right thyroid nodule. No adenopathy. Disc levels: Multilevel disc and facet degeneration throughout the cervical spine. No significant spinal stenosis. Upper chest: Irregular soft tissue density with associated calcification left upper lobe anteriorly. This was present on prior chest CT October 22, 2017. Probable scarring Other: None IMPRESSION: Atrophy and chronic ischemic changes. No acute intracranial abnormality Negative for cervical spine fracture.  Cervical spondylosis 14 mm right thyroid nodule. No further imaging is indicated based on size of nodule and patient age. Irregular soft tissue density with calcification left upper lobe anteriorly, probable scarring. Electronically Signed   By: Franchot Gallo M.D.   On: 12/27/2018 20:40   Ct Cervical Spine Wo Contrast  Result Date: 12/27/2018 CLINICAL DATA:  Head trauma.  Fall EXAM: CT HEAD WITHOUT CONTRAST CT CERVICAL SPINE WITHOUT CONTRAST TECHNIQUE: Multidetector CT imaging of the head and  cervical spine was performed following the standard protocol without intravenous contrast. Multiplanar CT image reconstructions of the cervical spine were also generated. COMPARISON:  CT head 09/17/2018 FINDINGS: CT HEAD FINDINGS Brain: Moderate atrophy. Chronic ischemic changes in the white matter. Chronic infarct left frontal lobe unchanged. Negative for acute infarct.  Negative for acute hemorrhage or mass Vascular: Negative for hyperdense vessel Skull:   Negative for fracture Sinuses/Orbits: Bubbly secretions in the right sphenoid sinus otherwise clear sinuses. Bilateral cataract surgery. Other: None CT CERVICAL SPINE FINDINGS Alignment: Mild retrolisthesis C3-4. Mild anterolisthesis C7-T1, T1-T2, T2-3 Skull base and  vertebrae: Negative for fracture or mass Soft tissues and spinal canal: 14 mm right thyroid nodule. No adenopathy. Disc levels: Multilevel disc and facet degeneration throughout the cervical spine. No significant spinal stenosis. Upper chest: Irregular soft tissue density with associated calcification left upper lobe anteriorly. This was present on prior chest CT October 22, 2017. Probable scarring Other: None IMPRESSION: Atrophy and chronic ischemic changes. No acute intracranial abnormality Negative for cervical spine fracture.  Cervical spondylosis 14 mm right thyroid nodule. No further imaging is indicated based on size of nodule and patient age. Irregular soft tissue density with calcification left upper lobe anteriorly, probable scarring. Electronically Signed   By: Franchot Gallo M.D.   On: 12/27/2018 20:40   Dg Knee Complete 4 Views Right  Result Date: 12/27/2018 CLINICAL DATA:  Fall EXAM: RIGHT KNEE - COMPLETE 4+ VIEW COMPARISON:  None. FINDINGS: No definite fracture or dislocation. Tricompartmental osteoarthritis is seen most notable in the medial compartment with joint space loss and marginal osteophyte formation. No large knee joint effusion. Dense vascular calcifications are noted.  IMPRESSION: No acute osseous abnormality. Electronically Signed   By: Prudencio Pair M.D.   On: 12/27/2018 21:00   Dg Femur Min 2 Views Right  Result Date: 12/27/2018 CLINICAL DATA:  Fall EXAM: RIGHT FEMUR 2 VIEWS COMPARISON:  None. FINDINGS: Comminuted impacted fracture of the proximal right femoral shaft involving the greater and lesser trochanters. Femoral head is still seated in the acetabulum. A healed fracture deformity of the fibula. 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 Dense vascular calcifications are noted. There is diffuse osteopenia. IMPRESSION: Comminuted impacted fracture of the proximal femoral shaft above the greater lesser trochanter. Electronically Signed   By: Prudencio Pair M.D.   On: 12/27/2018 21:01    ROS  ROS: I have reviewed the patient's review of systems thoroughly and there are no positive responses as relates to the HPI. Blood pressure 125/62, pulse 94, temperature 97.7 F (36.5 C), temperature source Oral, resp. rate 16, SpO2 93 %. Physical Exam Well-developed well-nourished patient in no acute distress. Alert and oriented x3 HEENT:within normal limits Neck: No tenderness to palpation in the posterior aspect of the neck.  There is no pain with range of motion of the neck. Cardiac: Regular rate and rhythm Pulmonary: Lungs clear to auscultation Abdomen: Soft and nontender.  Normal active bowel sounds  Musculoskeletal: (right hip is actually rotated and shortened.  There is pain with all range of motion.  She is neurovascular intact distally. Assessment/Plan: The patient is in a cervical collar for unknown reasons.  Her CAT scan was negative for fracture.  I took her collar off and examined her.  She had minimal pain.  I talked to her medical team who was unaware of why she was in a collar.  I removed her cervical collar and feel that there is no additional need for this.   85 year old female resident of a nursing home who fell earlier today and is  intertrochanteric hip fracture.  She'll be taken operating room when operative time is available.  She will need open reduction and fixation of her right hip with an intramedullary rod.  She will need touchdown weightbearing and will likely be discharged to skilled nursing..I have had a prolonged discussion with the patient regarding the risk and benefits of the surgical procedure.  The patient understands the risks include but are not limited to bleeding, infection and failure of the surgery to cure the problem and need for further surgery.  The patient understands there is a slight risk of death at the time of surgery.  The patient understands these risks along with the potential benefits and wishes to proceed with surgical intervention.  The patient will be followed in the office in the postoperative period.  I had a long conversation with her niece today about the surgery and need for surgery.  She is well aware of the risks and does want [place] to proceed.  [name] 12/27/2018, 10:54 PM

## 2018-12-27 NOTE — Consult Note (Signed)
Reason for Consult:right intertrochanteric hip fracture Referring Physician: hospitalists  Jasmine Jones is an 85 y.o. female.  HPI: this is a 85 year old female who fell earlier the day.  She was admitted to the hospital with a right intratrochanteric hip fracture.  We're consult for management of her fracture.  I reviewed her x-rays and had a discussion with the emergency room physicians.  She will need open reduction internal fixation with an intramedullary rod.  She'll be cleared by the internal medicine service and will take her to the operating room when operative time is available tomorrow.  The patient is in a cervical collar for unknown reasons.  She is completely hard of hearing and I am unable to communicate with her.  Past Medical History:  Diagnosis Date  . A-fib ([place])   . Anemia   . Anxiety   . Dry skin    on ears  . Gastroesophageal reflux   . Hearing loss    significant  uses hearing aids  . High blood pressure   . Hyperlipidemia   . Loss of appetite   . Loss of smell   . Macular degeneration   . Malnutrition ([place])   . Osteoporosis   . Persistent dry cough   . Skin cancer    basal / squamous  . Slow transit constipation    abstracted from new patient packet   . Vitamin D deficiency 01/26/2018    No past surgical history on file.  Family History  Problem Relation Age of Onset  . Atrial fibrillation Sister   . Diabetes Sister   . Osteoarthritis Sister   . Osteopenia Sister   . Mental retardation Sister     Social History:  reports that she has never smoked. She has never used smokeless tobacco. She reports that she does not drink alcohol or use drugs.  Allergies: No Known Allergies  Medications: I have reviewed the patient's current medications.  Results for orders placed or performed during the hospital encounter of 12/27/18 (from the past 48 hour(s))  CBC with Differential/Platelet     Status: Abnormal   Collection Time: 12/27/18  8:14 PM   Result Value Ref Range   WBC 11.7 (H) 4.0 - 10.5 K/uL   RBC 4.78 3.87 - 5.11 MIL/uL   Hemoglobin 12.8 12.0 - 15.0 g/dL   HCT 40.3 36.0 - 46.0 %   MCV 84.3 80.0 - 100.0 fL   MCH 26.8 26.0 - 34.0 pg   MCHC 31.8 30.0 - 36.0 g/dL   RDW 13.2 11.5 - 15.5 %   Platelets 213 150 - 400 K/uL   nRBC 0.0 0.0 - 0.2 %   Neutrophils Relative % 78 %   Neutro Abs 9.1 (H) 1.7 - 7.7 K/uL   Lymphocytes Relative 14 %   Lymphs Abs 1.6 0.7 - 4.0 K/uL   Monocytes Relative 7 %   Monocytes Absolute 0.8 0.1 - 1.0 K/uL   Eosinophils Relative 0 %   Eosinophils Absolute 0.0 0.0 - 0.5 K/uL   Basophils Relative 0 %   Basophils Absolute 0.0 0.0 - 0.1 K/uL   Immature Granulocytes 1 %   Abs Immature Granulocytes 0.07 0.00 - 0.07 K/uL    Comment: Performed at [facility], [place] [address], [place], [place] [address]  Comprehensive metabolic panel     Status: Abnormal   Collection Time: 12/27/18  8:14 PM  Result Value Ref Range   Sodium 137 135 - 145 mmol/L   Potassium 4.2 3.5 -  5.1 mmol/L   Chloride 93 (L) 98 - 111 mmol/L   CO2 30 22 - 32 mmol/L   Glucose, Bld 120 (H) 70 - 99 mg/dL   BUN 27 (H) 8 - 23 mg/dL   Creatinine, Ser 0.84 0.44 - 1.00 mg/dL   Calcium 9.3 8.9 - 10.3 mg/dL   Total Protein 6.5 6.5 - 8.1 g/dL   Albumin 3.6 3.5 - 5.0 g/dL   AST 51 (H) 15 - 41 U/L   ALT 12 0 - 44 U/L   Alkaline Phosphatase 82 38 - 126 U/L   Total Bilirubin 1.9 (H) 0.3 - 1.2 mg/dL   GFR calc non Af Amer >60 >60 mL/min   GFR calc Af Amer >60 >60 mL/min   Anion gap 14 5 - 15    Comment: Performed at [facility], [place] [address], [place], [place] [address]  SARS Coronavirus 2 by RT PCR (hospital order, performed in [facility] hospital lab) Nasopharyngeal Nasopharyngeal Swab     Status: None   Collection Time: 12/27/18  9:29 PM   Specimen: Nasopharyngeal Swab  Result Value Ref Range   SARS Coronavirus 2 NEGATIVE NEGATIVE    Comment: (NOTE) If result is NEGATIVE SARS-CoV-2 target  nucleic acids are NOT DETECTED. The SARS-CoV-2 RNA is generally detectable in upper and lower  respiratory specimens during the acute phase of infection. The lowest  concentration of SARS-CoV-2 viral copies this assay can detect is 250  copies / mL. A negative result does not preclude SARS-CoV-2 infection  and should not be used as the sole basis for treatment or other  patient management decisions.  A negative result may occur with  improper specimen collection / handling, submission of specimen other  than nasopharyngeal swab, presence of viral mutation(s) within the  areas targeted by this assay, and inadequate number of viral copies  (<250 copies / mL). A negative result must be combined with clinical  observations, patient history, and epidemiological information. If result is POSITIVE SARS-CoV-2 target nucleic acids are DETECTED. The SARS-CoV-2 RNA is generally detectable in upper and lower  respiratory specimens dur ing the acute phase of infection.  Positive  results are indicative of active infection with SARS-CoV-2.  Clinical  correlation with patient history and other diagnostic information is  necessary to determine patient infection status.  Positive results do  not rule out bacterial infection or co-infection with other viruses. If result is PRESUMPTIVE POSTIVE SARS-CoV-2 nucleic acids MAY BE PRESENT.   A presumptive positive result was obtained on the submitted specimen  and confirmed on repeat testing.  While 2019 novel coronavirus  (SARS-CoV-2) nucleic acids may be present in the submitted sample  additional confirmatory testing may be necessary for epidemiological  and / or clinical management purposes  to differentiate between  SARS-CoV-2 and other Sarbecovirus currently known to infect humans.  If clinically indicated additional testing with an alternate test  methodology [phone]) is advised. The SARS-CoV-2 RNA is generally  detectable in upper and lower  respiratory sp ecimens during the acute  phase of infection. The expected result is Negative. Fact Sheet for Patients:  StrictlyIdeas.no Fact Sheet for Healthcare Providers: BankingDealers.co.za This test is not yet approved or cleared by the [place] FDA and has been authorized for detection and/or diagnosis of SARS-CoV-2 by FDA under an Emergency Use Authorization (EUA).  This EUA will remain in effect (meaning this test can be used) for the duration of the COVID-19 declaration under Section 564(b)(1) of the  Act, 21 U.S.C. section 360bbb-3(b)(1), unless the authorization is terminated or revoked sooner. Performed at [facility], [place] [address] [place], [place] [address]     Dg Chest 1 View  Result Date: 12/27/2018 CLINICAL DATA:  85 year old female with history of fall.  Hip pain. EXAM: CHEST  1 VIEW COMPARISON:  Chest x-ray 11/13/2016. FINDINGS: Lung volumes are normal. No consolidative airspace disease. No pleural effusions. No pneumothorax. No pulmonary nodule or mass noted. Pulmonary vasculature and the cardiomediastinal silhouette are within normal limits. Atherosclerotic calcifications in the thoracic aorta. IMPRESSION: 1.  No radiographic evidence of acute cardiopulmonary disease. 2. Aortic atherosclerosis. Electronically Signed   By: Vinnie Langton M.D.   On: 12/27/2018 21:00   Dg Pelvis 1-2 Views  Result Date: 12/27/2018 CLINICAL DATA:  Fall EXAM: PELVIS - 1-2 VIEW COMPARISON:  None. FINDINGS: There is a comminuted impacted fracture of the right proximal femur involving the greater and lesser trochanters. The femoral head still articulates with the acetabulum. No other definite fracture seen. IMPRESSION: Comminuted impacted proximal right femoral fracture involving the greater and lesser trochanters. Electronically Signed   By: Prudencio Pair M.D.   On: 12/27/2018 20:59   Ct Head Wo Contrast  Result  Date: 12/27/2018 CLINICAL DATA:  Head trauma.  Fall EXAM: CT HEAD WITHOUT CONTRAST CT CERVICAL SPINE WITHOUT CONTRAST TECHNIQUE: Multidetector CT imaging of the head and cervical spine was performed following the standard protocol without intravenous contrast. Multiplanar CT image reconstructions of the cervical spine were also generated. COMPARISON:  CT head 09/17/2018 FINDINGS: CT HEAD FINDINGS Brain: Moderate atrophy. Chronic ischemic changes in the white matter. Chronic infarct left frontal lobe unchanged. Negative for acute infarct.  Negative for acute hemorrhage or mass Vascular: Negative for hyperdense vessel Skull:   Negative for fracture Sinuses/Orbits: Bubbly secretions in the right sphenoid sinus otherwise clear sinuses. Bilateral cataract surgery. Other: None CT CERVICAL SPINE FINDINGS Alignment: Mild retrolisthesis C3-4. Mild anterolisthesis C7-T1, T1-T2, T2-3 Skull base and vertebrae: Negative for fracture or mass Soft tissues and spinal canal: 14 mm right thyroid nodule. No adenopathy. Disc levels: Multilevel disc and facet degeneration throughout the cervical spine. No significant spinal stenosis. Upper chest: Irregular soft tissue density with associated calcification left upper lobe anteriorly. This was present on prior chest CT October 22, 2017. Probable scarring Other: None IMPRESSION: Atrophy and chronic ischemic changes. No acute intracranial abnormality Negative for cervical spine fracture.  Cervical spondylosis 14 mm right thyroid nodule. No further imaging is indicated based on size of nodule and patient age. Irregular soft tissue density with calcification left upper lobe anteriorly, probable scarring. Electronically Signed   By: Franchot Gallo M.D.   On: 12/27/2018 20:40   Ct Cervical Spine Wo Contrast  Result Date: 12/27/2018 CLINICAL DATA:  Head trauma.  Fall EXAM: CT HEAD WITHOUT CONTRAST CT CERVICAL SPINE WITHOUT CONTRAST TECHNIQUE: Multidetector CT imaging of the head and  cervical spine was performed following the standard protocol without intravenous contrast. Multiplanar CT image reconstructions of the cervical spine were also generated. COMPARISON:  CT head 09/17/2018 FINDINGS: CT HEAD FINDINGS Brain: Moderate atrophy. Chronic ischemic changes in the white matter. Chronic infarct left frontal lobe unchanged. Negative for acute infarct.  Negative for acute hemorrhage or mass Vascular: Negative for hyperdense vessel Skull:   Negative for fracture Sinuses/Orbits: Bubbly secretions in the right sphenoid sinus otherwise clear sinuses. Bilateral cataract surgery. Other: None CT CERVICAL SPINE FINDINGS Alignment: Mild retrolisthesis C3-4. Mild anterolisthesis C7-T1, T1-T2, T2-3 Skull base and  vertebrae: Negative for fracture or mass Soft tissues and spinal canal: 14 mm right thyroid nodule. No adenopathy. Disc levels: Multilevel disc and facet degeneration throughout the cervical spine. No significant spinal stenosis. Upper chest: Irregular soft tissue density with associated calcification left upper lobe anteriorly. This was present on prior chest CT October 22, 2017. Probable scarring Other: None IMPRESSION: Atrophy and chronic ischemic changes. No acute intracranial abnormality Negative for cervical spine fracture.  Cervical spondylosis 14 mm right thyroid nodule. No further imaging is indicated based on size of nodule and patient age. Irregular soft tissue density with calcification left upper lobe anteriorly, probable scarring. Electronically Signed   By: Franchot Gallo M.D.   On: 12/27/2018 20:40   Dg Knee Complete 4 Views Right  Result Date: 12/27/2018 CLINICAL DATA:  Fall EXAM: RIGHT KNEE - COMPLETE 4+ VIEW COMPARISON:  None. FINDINGS: No definite fracture or dislocation. Tricompartmental osteoarthritis is seen most notable in the medial compartment with joint space loss and marginal osteophyte formation. No large knee joint effusion. Dense vascular calcifications are noted.  IMPRESSION: No acute osseous abnormality. Electronically Signed   By: Prudencio Pair M.D.   On: 12/27/2018 21:00   Dg Femur Min 2 Views Right  Result Date: 12/27/2018 CLINICAL DATA:  Fall EXAM: RIGHT FEMUR 2 VIEWS COMPARISON:  None. FINDINGS: Comminuted impacted fracture of the proximal right femoral shaft involving the greater and lesser trochanters. Femoral head is still seated in the acetabulum. A healed fracture deformity of the fibula. 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 Dense vascular calcifications are noted. There is diffuse osteopenia. IMPRESSION: Comminuted impacted fracture of the proximal femoral shaft above the greater lesser trochanter. Electronically Signed   By: Prudencio Pair M.D.   On: 12/27/2018 21:01    ROS  ROS: I have reviewed the patient's review of systems thoroughly and there are no positive responses as relates to the HPI. Blood pressure 125/62, pulse 94, temperature 97.7 F (36.5 C), temperature source Oral, resp. rate 16, SpO2 93 %. Physical Exam Well-developed well-nourished patient in no acute distress. Alert and oriented x3 HEENT:within normal limits Neck: No tenderness to palpation in the posterior aspect of the neck.  There is no pain with range of motion of the neck. Cardiac: Regular rate and rhythm Pulmonary: Lungs clear to auscultation Abdomen: Soft and nontender.  Normal active bowel sounds  Musculoskeletal: (right hip is actually rotated and shortened.  There is pain with all range of motion.  She is neurovascular intact distally. Assessment/Plan: The patient is in a cervical collar for unknown reasons.  Her CAT scan was negative for fracture.  I took her collar off and examined her.  She had minimal pain.  I talked to her medical team who was unaware of why she was in a collar.  I removed her cervical collar and feel that there is no additional need for this.   85 year old female resident of a nursing home who fell earlier today and is  intertrochanteric hip fracture.  She'll be taken operating room when operative time is available.  She will need open reduction and fixation of her right hip with an intramedullary rod.  She will need touchdown weightbearing and will likely be discharged to skilled nursing..I have had a prolonged discussion with the patient regarding the risk and benefits of the surgical procedure.  The patient understands the risks include but are not limited to bleeding, infection and failure of the surgery to cure the problem and need for further surgery.  The patient understands there is a slight risk of death at the time of surgery.  The patient understands these risks along with the potential benefits and wishes to proceed with surgical intervention.  The patient will be followed in the office in the postoperative period.  I had a long conversation with her niece today about the surgery and need for surgery.  She is well aware of the risks and does want [place] to proceed.  Jasmine Jones 12/27/2018, 10:54 PM

## 2018-12-27 NOTE — ED Notes (Signed)
Called niece to give an update, no answer.

## 2018-12-28 ENCOUNTER — Inpatient Hospital Stay (HOSPITAL_COMMUNITY): Payer: Medicare Other | Admitting: Anesthesiology

## 2018-12-28 ENCOUNTER — Inpatient Hospital Stay (HOSPITAL_COMMUNITY): Payer: Medicare Other

## 2018-12-28 ENCOUNTER — Encounter (HOSPITAL_COMMUNITY): Payer: Self-pay | Admitting: Certified Registered Nurse Anesthetist

## 2018-12-28 ENCOUNTER — Encounter (HOSPITAL_COMMUNITY)
Admission: EM | Disposition: A | Discharge status: Skilled Nursing Facility | Payer: Self-pay | Source: Skilled Nursing Facility | Attending: Internal Medicine

## 2018-12-28 HISTORY — PX: FEMUR IM NAIL: SHX1597

## 2018-12-28 LAB — POCT I-STAT EG7
Acid-Base Excess: 5 mmol/L — ABNORMAL HIGH (ref 0.0–2.0)
Bicarbonate: 28.6 mmol/L — ABNORMAL HIGH (ref 20.0–28.0)
Calcium, Ion: 1.13 mmol/L — ABNORMAL LOW (ref 1.15–1.40)
HCT: 31 % — ABNORMAL LOW (ref 36.0–46.0)
Hemoglobin: 10.5 g/dL — ABNORMAL LOW (ref 12.0–15.0)
O2 Saturation: 77 %
Potassium: 3.5 mmol/L (ref 3.5–5.1)
Sodium: 137 mmol/L (ref 135–145)
TCO2: 30 mmol/L (ref 22–32)
pCO2, Ven: 36 mmHg — ABNORMAL LOW (ref 44.0–60.0)
pH, Ven: 7.508 — ABNORMAL HIGH (ref 7.250–7.430)
pO2, Ven: 38 mmHg (ref 32.0–45.0)

## 2018-12-28 LAB — BASIC METABOLIC PANEL
Anion gap: 13 (ref 5–15)
Anion gap: 11 (ref 5–15)
Anion gap: 9 (ref 5–15)
BUN: 24 mg/dL — ABNORMAL HIGH (ref 8–23)
BUN: 20 mg/dL (ref 8–23)
BUN: 16 mg/dL (ref 8–23)
CO2: 30 mmol/L (ref 22–32)
CO2: 32 mmol/L (ref 22–32)
CO2: 30 mmol/L (ref 22–32)
Calcium: 9 mg/dL (ref 8.9–10.3)
Calcium: 8.9 mg/dL (ref 8.9–10.3)
Calcium: 8.4 mg/dL — ABNORMAL LOW (ref 8.9–10.3)
Chloride: 95 mmol/L — ABNORMAL LOW (ref 98–111)
Chloride: 96 mmol/L — ABNORMAL LOW (ref 98–111)
Chloride: 98 mmol/L (ref 98–111)
Creatinine, Ser: 0.72 mg/dL (ref 0.44–1.00)
Creatinine, Ser: 0.57 mg/dL (ref 0.44–1.00)
Creatinine, Ser: 0.58 mg/dL (ref 0.44–1.00)
GFR calc Af Amer: >60 mL/min (ref >60)
GFR calc Af Amer: >60 mL/min (ref >60)
GFR calc Af Amer: >60 mL/min (ref >60)
GFR calc non Af Amer: >60 mL/min (ref >60)
GFR calc non Af Amer: >60 mL/min (ref >60)
GFR calc non Af Amer: >60 mL/min (ref >60)
Glucose, Bld: 128 mg/dL — ABNORMAL HIGH (ref 70–99)
Glucose, Bld: 119 mg/dL — ABNORMAL HIGH (ref 70–99)
Glucose, Bld: 152 mg/dL — ABNORMAL HIGH (ref 70–99)
Potassium: 2.5 mmol/L — CL (ref 3.5–5.1)
Potassium: 2.9 mmol/L — ABNORMAL LOW (ref 3.5–5.1)
Potassium: 3.5 mmol/L (ref 3.5–5.1)
Sodium: 138 mmol/L (ref 135–145)
Sodium: 139 mmol/L (ref 135–145)
Sodium: 137 mmol/L (ref 135–145)

## 2018-12-28 LAB — CBC
HCT: 36.8 % (ref 36.0–46.0)
Hemoglobin: 11.9 g/dL — ABNORMAL LOW (ref 12.0–15.0)
MCH: 27 pg (ref 26.0–34.0)
MCHC: 32.3 g/dL (ref 30.0–36.0)
MCV: 83.6 fL (ref 80.0–100.0)
Platelets: 159 10*3/uL (ref 150–400)
RBC: 4.4 MIL/uL (ref 3.87–5.11)
RDW: 13 % (ref 11.5–15.5)
WBC: 12.2 10*3/uL — ABNORMAL HIGH (ref 4.0–10.5)
nRBC: 0 % (ref 0.0–0.2)

## 2018-12-28 LAB — PROTIME-INR
INR: 1 (ref 0.8–1.2)
Prothrombin Time: 13.2 seconds (ref 11.4–15.2)

## 2018-12-28 LAB — ABO/RH: ABO/RH(D): O POS

## 2018-12-28 LAB — MRSA PCR SCREENING: MRSA by PCR: NEGATIVE

## 2018-12-28 LAB — MAGNESIUM: Magnesium: 1.8 mg/dL (ref 1.7–2.4)

## 2018-12-28 SURGERY — INSERTION, INTRAMEDULLARY ROD, FEMUR
Anesthesia: General | Site: Hip | Laterality: Right

## 2018-12-28 MED ORDER — DOCUSATE SODIUM 100 MG PO CAPS
100.0000 mg | ORAL_CAPSULE | Freq: Two times a day (BID) | ORAL | Status: DC
  Administered 2018-12-28 – 2018-12-30 (×4): 100 mg via ORAL
  Filled 2018-12-28 (×5): qty 1

## 2018-12-28 MED ORDER — MAGNESIUM SULFATE IN D5W 1-5 GM/100ML-% IV SOLN
1.0000 g | Freq: Once | INTRAVENOUS | Status: AC
  Administered 2018-12-28: 1 g via INTRAVENOUS
  Filled 2018-12-28: qty 100

## 2018-12-28 MED ORDER — METOCLOPRAMIDE HCL 5 MG/ML IJ SOLN: 10.0000 mg | Freq: Once | INTRAMUSCULAR | Status: DC | PRN

## 2018-12-28 MED ORDER — FENTANYL CITRATE (PF) 100 MCG/2ML IJ SOLN
INTRAMUSCULAR | Status: AC
  Filled 2018-12-28: qty 2

## 2018-12-28 MED ORDER — MEPERIDINE HCL 50 MG/ML IJ SOLN: 6.2500 mg | INTRAMUSCULAR | Status: DC | PRN

## 2018-12-28 MED ORDER — TRAMADOL HCL 50 MG PO TABS
50.0000 mg | ORAL_TABLET | Freq: Four times a day (QID) | ORAL | Status: DC | PRN
  Administered 2018-12-28 – 2018-12-30 (×2): 50 mg via ORAL
  Filled 2018-12-28 (×2): qty 1

## 2018-12-28 MED ORDER — ROCURONIUM BROMIDE 10 MG/ML (PF) SYRINGE
PREFILLED_SYRINGE | INTRAVENOUS | Status: AC
  Filled 2018-12-28: qty 10

## 2018-12-28 MED ORDER — ADULT MULTIVITAMIN W/MINERALS CH
1.0000 | ORAL_TABLET | Freq: Every day | ORAL | Status: DC
  Administered 2018-12-29 – 2018-12-30 (×2): 1 via ORAL
  Filled 2018-12-28 (×3): qty 1

## 2018-12-28 MED ORDER — POTASSIUM CHLORIDE 10 MEQ/100ML IV SOLN
10.0000 meq | INTRAVENOUS | Status: AC
  Administered 2018-12-28 (×3): 10 meq via INTRAVENOUS
  Filled 2018-12-28: qty 100

## 2018-12-28 MED ORDER — SENNOSIDES-DOCUSATE SODIUM 8.6-50 MG PO TABS: 1.0000 | ORAL_TABLET | Freq: Every evening | ORAL | Status: DC | PRN

## 2018-12-28 MED ORDER — METOPROLOL SUCCINATE ER 25 MG PO TB24
25.0000 mg | ORAL_TABLET | Freq: Every day | ORAL | Status: DC
  Administered 2018-12-28 – 2019-01-01 (×4): 25 mg via ORAL
  Filled 2018-12-28 (×5): qty 1

## 2018-12-28 MED ORDER — ONDANSETRON HCL 4 MG PO TABS: 4.0000 mg | ORAL_TABLET | Freq: Four times a day (QID) | ORAL | Status: DC | PRN

## 2018-12-28 MED ORDER — POTASSIUM CHLORIDE 10 MEQ/100ML IV SOLN
10.0000 meq | INTRAVENOUS | Status: AC
  Administered 2018-12-28 (×4): 10 meq via INTRAVENOUS
  Filled 2018-12-28 (×4): qty 100

## 2018-12-28 MED ORDER — POTASSIUM CHLORIDE CRYS ER 10 MEQ PO TBCR
10.0000 meq | EXTENDED_RELEASE_TABLET | Freq: Two times a day (BID) | ORAL | Status: DC
  Administered 2018-12-28 – 2018-12-30 (×5): 10 meq via ORAL
  Filled 2018-12-28 (×7): qty 1

## 2018-12-28 MED ORDER — METHOCARBAMOL 1000 MG/10ML IJ SOLN
500.0000 mg | Freq: Four times a day (QID) | INTRAVENOUS | Status: DC | PRN
  Administered 2018-12-31: 500 mg via INTRAVENOUS
  Filled 2018-12-28: qty 500
  Filled 2018-12-28: qty 5

## 2018-12-28 MED ORDER — HYDROCODONE-ACETAMINOPHEN 5-325 MG PO TABS
1.0000 | ORAL_TABLET | Freq: Four times a day (QID) | ORAL | Status: DC | PRN
  Administered 2018-12-29 – 2019-01-01 (×5): 1 via ORAL
  Filled 2018-12-28 (×6): qty 1

## 2018-12-28 MED ORDER — FAMOTIDINE 20 MG PO TABS
20.0000 mg | ORAL_TABLET | Freq: Every day | ORAL | Status: DC
  Administered 2018-12-28 – 2018-12-30 (×4): 20 mg via ORAL
  Filled 2018-12-28 (×5): qty 1

## 2018-12-28 MED ORDER — LIDOCAINE 2% (20 MG/ML) 5 ML SYRINGE
INTRAMUSCULAR | Status: DC | PRN
  Administered 2018-12-28: 60 mg via INTRAVENOUS

## 2018-12-28 MED ORDER — 0.9 % SODIUM CHLORIDE (POUR BTL) OPTIME
TOPICAL | Status: DC | PRN
  Administered 2018-12-28: 1000 mL

## 2018-12-28 MED ORDER — SUCCINYLCHOLINE CHLORIDE 20 MG/ML IJ SOLN
INTRAMUSCULAR | Status: DC | PRN
  Administered 2018-12-28: 80 mg via INTRAVENOUS

## 2018-12-28 MED ORDER — ONDANSETRON HCL 4 MG/2ML IJ SOLN
INTRAMUSCULAR | Status: DC | PRN
  Administered 2018-12-28: 4 mg via INTRAVENOUS

## 2018-12-28 MED ORDER — ALUM & MAG HYDROXIDE-SIMETH 200-200-20 MG/5ML PO SUSP: 30.0000 mL | ORAL | Status: DC | PRN

## 2018-12-28 MED ORDER — ONDANSETRON HCL 4 MG/2ML IJ SOLN
INTRAMUSCULAR | Status: AC
  Filled 2018-12-28: qty 2

## 2018-12-28 MED ORDER — METHOCARBAMOL 500 MG PO TABS
500.0000 mg | ORAL_TABLET | Freq: Four times a day (QID) | ORAL | Status: DC | PRN
  Administered 2018-12-29 – 2018-12-30 (×4): 500 mg via ORAL
  Filled 2018-12-28 (×4): qty 1

## 2018-12-28 MED ORDER — PROPOFOL 500 MG/50ML IV EMUL
INTRAVENOUS | Status: AC
  Filled 2018-12-28: qty 50

## 2018-12-28 MED ORDER — SUGAMMADEX SODIUM 200 MG/2ML IV SOLN
INTRAVENOUS | Status: DC | PRN
  Administered 2018-12-28: 150 mg via INTRAVENOUS

## 2018-12-28 MED ORDER — BUSPIRONE HCL 10 MG PO TABS
10.0000 mg | ORAL_TABLET | Freq: Three times a day (TID) | ORAL | Status: DC
  Administered 2018-12-28 – 2019-01-01 (×9): 10 mg via ORAL
  Filled 2018-12-28 (×11): qty 1

## 2018-12-28 MED ORDER — ACETAMINOPHEN 500 MG PO TABS
500.0000 mg | ORAL_TABLET | Freq: Four times a day (QID) | ORAL | Status: AC
  Administered 2018-12-28 – 2018-12-29 (×2): 500 mg via ORAL
  Filled 2018-12-28 (×3): qty 1

## 2018-12-28 MED ORDER — FENTANYL CITRATE (PF) 100 MCG/2ML IJ SOLN
INTRAMUSCULAR | Status: DC | PRN
  Administered 2018-12-28: 25 ug via INTRAVENOUS
  Administered 2018-12-28: 50 ug via INTRAVENOUS
  Administered 2018-12-28: 25 ug via INTRAVENOUS

## 2018-12-28 MED ORDER — ASPIRIN EC 325 MG PO TBEC
325.0000 mg | DELAYED_RELEASE_TABLET | Freq: Every day | ORAL | Status: DC
  Administered 2018-12-29 – 2019-01-01 (×3): 325 mg via ORAL
  Filled 2018-12-28 (×4): qty 1

## 2018-12-28 MED ORDER — LACTATED RINGERS IV SOLN
INTRAVENOUS | Status: DC
  Administered 2018-12-28 – 2018-12-30 (×5): via INTRAVENOUS

## 2018-12-28 MED ORDER — PHENYLEPHRINE HCL (PRESSORS) 10 MG/ML IV SOLN
INTRAVENOUS | Status: AC
  Filled 2018-12-28: qty 1

## 2018-12-28 MED ORDER — MORPHINE SULFATE (PF) 2 MG/ML IV SOLN
1.0000 mg | INTRAVENOUS | Status: DC | PRN
  Administered 2018-12-28: 2 mg via INTRAVENOUS
  Filled 2018-12-28: qty 1

## 2018-12-28 MED ORDER — FENTANYL CITRATE (PF) 100 MCG/2ML IJ SOLN: 25.0000 ug | INTRAMUSCULAR | Status: DC | PRN

## 2018-12-28 MED ORDER — ONDANSETRON HCL 4 MG/2ML IJ SOLN: 4.0000 mg | Freq: Four times a day (QID) | INTRAMUSCULAR | Status: DC | PRN

## 2018-12-28 MED ORDER — SIMVASTATIN 10 MG PO TABS
20.0000 mg | ORAL_TABLET | Freq: Every day | ORAL | Status: DC
  Administered 2018-12-28 – 2018-12-31 (×5): 20 mg via ORAL
  Filled 2018-12-28 (×5): qty 2

## 2018-12-28 MED ORDER — TRAMADOL HCL 50 MG PO TABS: 50.0000 mg | ORAL_TABLET | Freq: Four times a day (QID) | ORAL | 0 refills | Status: DC | PRN

## 2018-12-28 MED ORDER — FENTANYL CITRATE (PF) 100 MCG/2ML IJ SOLN
25.0000 ug | INTRAMUSCULAR | Status: DC | PRN
  Administered 2018-12-28 (×2): 25 ug via INTRAVENOUS

## 2018-12-28 MED ORDER — CEFAZOLIN SODIUM-DEXTROSE 2-4 GM/100ML-% IV SOLN
2.0000 g | Freq: Four times a day (QID) | INTRAVENOUS | Status: AC
  Administered 2018-12-28 – 2018-12-29 (×2): 2 g via INTRAVENOUS
  Filled 2018-12-28 (×2): qty 100

## 2018-12-28 MED ORDER — DEXAMETHASONE SODIUM PHOSPHATE 10 MG/ML IJ SOLN
INTRAMUSCULAR | Status: AC
  Filled 2018-12-28: qty 1

## 2018-12-28 MED ORDER — ALPRAZOLAM 0.25 MG PO TABS
0.2500 mg | ORAL_TABLET | Freq: Four times a day (QID) | ORAL | Status: DC | PRN
  Administered 2018-12-28 – 2019-01-01 (×7): 0.25 mg via ORAL
  Filled 2018-12-28 (×6): qty 1

## 2018-12-28 MED ORDER — PROPOFOL 10 MG/ML IV BOLUS
INTRAVENOUS | Status: AC
  Filled 2018-12-28: qty 20

## 2018-12-28 MED ORDER — PROPOFOL 10 MG/ML IV BOLUS
INTRAVENOUS | Status: DC | PRN
  Administered 2018-12-28: 60 mg via INTRAVENOUS

## 2018-12-28 MED ORDER — MEMANTINE HCL ER 28 MG PO CP24
28.0000 mg | ORAL_CAPSULE | Freq: Every day | ORAL | Status: DC
  Administered 2018-12-29 – 2019-01-01 (×3): 28 mg via ORAL
  Filled 2018-12-28 (×5): qty 1

## 2018-12-28 MED ORDER — SERTRALINE HCL 50 MG PO TABS
50.0000 mg | ORAL_TABLET | Freq: Every day | ORAL | Status: DC
  Administered 2018-12-29 – 2019-01-01 (×3): 50 mg via ORAL
  Filled 2018-12-28 (×4): qty 1

## 2018-12-28 MED ORDER — ASPIRIN EC 325 MG PO TBEC: 325.0000 mg | DELAYED_RELEASE_TABLET | Freq: Every day | ORAL | 0 refills | Status: DC

## 2018-12-28 MED ORDER — ENSURE ENLIVE PO LIQD
237.0000 mL | Freq: Two times a day (BID) | ORAL | Status: DC
  Administered 2018-12-29 (×2): 237 mL via ORAL

## 2018-12-28 MED ORDER — POTASSIUM CHLORIDE CRYS ER 20 MEQ PO TBCR
20.0000 meq | EXTENDED_RELEASE_TABLET | Freq: Three times a day (TID) | ORAL | Status: AC
  Administered 2018-12-28: 20 meq via ORAL
  Filled 2018-12-28 (×2): qty 1

## 2018-12-28 MED ORDER — ROCURONIUM BROMIDE 10 MG/ML (PF) SYRINGE
PREFILLED_SYRINGE | INTRAVENOUS | Status: DC | PRN
  Administered 2018-12-28: 40 mg via INTRAVENOUS

## 2018-12-28 MED ORDER — ACETAMINOPHEN 325 MG PO TABS: 325.0000 mg | ORAL_TABLET | Freq: Four times a day (QID) | ORAL | Status: DC | PRN

## 2018-12-28 SURGICAL SUPPLY — 38 items
BAG SPEC THK2 15X12 ZIP CLS (MISCELLANEOUS) ×1
BAG ZIPLOCK 12X15 (MISCELLANEOUS) ×3 IMPLANT
BIT DRILL 4.3MMS DISTAL GRDTED (BIT) IMPLANT
COVER SURGICAL LIGHT HANDLE (MISCELLANEOUS) ×3 IMPLANT
COVER WAND RF STERILE (DRAPES) IMPLANT
DRAPE STERI IOBAN 125X83 (DRAPES) ×3 IMPLANT
DRILL 4.3MMS DISTAL GRADUATED (BIT) ×3
DRSG EMULSION OIL 3X3 NADH (GAUZE/BANDAGES/DRESSINGS) ×3 IMPLANT
DRSG MEPILEX BORDER 4X4 (GAUZE/BANDAGES/DRESSINGS) ×9 IMPLANT
DRSG MEPILEX BORDER 4X8 (GAUZE/BANDAGES/DRESSINGS) ×3 IMPLANT
DURAPREP 26ML APPLICATOR (WOUND CARE) ×3 IMPLANT
ELECT REM PT RETURN 15FT ADLT (MISCELLANEOUS) ×3 IMPLANT
GAUZE XEROFORM 5X9 LF (GAUZE/BANDAGES/DRESSINGS) ×2 IMPLANT
GLOVE BIO SURGEON STRL SZ8 (GLOVE) ×6 IMPLANT
GLOVE ECLIPSE 7.5 STRL STRAW (GLOVE) ×6 IMPLANT
GOWN STRL REUS W/ TWL XL LVL3 (GOWN DISPOSABLE) ×1 IMPLANT
GOWN STRL REUS W/TWL LRG LVL3 (GOWN DISPOSABLE) ×3 IMPLANT
GOWN STRL REUS W/TWL XL LVL3 (GOWN DISPOSABLE) ×3
GUIDEPIN 3.2X17.5 THRD DISP (PIN) ×4 IMPLANT
GUIDEWIRE BALL NOSE 80CM (WIRE) ×2 IMPLANT
HFN RH 130 DEG 11MM X 360MM (Orthopedic Implant) ×2 IMPLANT
HIP FRAC NAIL LAG SCR 10.5X100 (Orthopedic Implant) ×2 IMPLANT
KIT BASIN OR (CUSTOM PROCEDURE TRAY) ×3 IMPLANT
KIT TURNOVER KIT A (KITS) IMPLANT
MANIFOLD NEPTUNE II (INSTRUMENTS) ×3 IMPLANT
NS IRRIG 1000ML POUR BTL (IV SOLUTION) ×3 IMPLANT
PACK GENERAL/GYN (CUSTOM PROCEDURE TRAY) ×3 IMPLANT
PROTECTOR NERVE ULNAR (MISCELLANEOUS) ×3 IMPLANT
SCREW BONE CORTICAL 5.0X38 (Screw) ×2 IMPLANT
SCREW BONE CORTICAL 5.0X42 (Screw) ×2 IMPLANT
SCREW CANN THRD AFF 10.5X100 (Orthopedic Implant) IMPLANT
STAPLER VISISTAT 35W (STAPLE) ×3 IMPLANT
SUT VIC AB 0 CT1 36 (SUTURE) ×3 IMPLANT
SUT VIC AB 1 CT1 27 (SUTURE) ×6
SUT VIC AB 1 CT1 27XBRD ANTBC (SUTURE) ×2 IMPLANT
TOWEL OR 17X26 10 PK STRL BLUE (TOWEL DISPOSABLE) ×3 IMPLANT
TOWEL OR NON WOVEN STRL DISP B (DISPOSABLE) ×3 IMPLANT
WATER STERILE IRR 1000ML POUR (IV SOLUTION) ×6 IMPLANT

## 2018-12-28 NOTE — Progress Notes (Signed)
PROGRESS NOTE  Jasmine Jones [id number] DOB: May 25, 1925 DOA: 12/27/2018 PCP: [name], NP  Brief History   Jasmine Jones is a 85 y.o. female with medical history significant for hypertension, hyperlipidemia, dementia, depression, and anxiety, now presenting to the emergency department with right hip pain after an unwitnessed fall.  Patient is unable to contribute to the history due to her dementia.  At her baseline, she is reportedly confused, ambulates with a walker, and is able to feed herself.  She was complaining of right hip pain and brought into the ED for evaluation.  She received 100 mcg of fentanyl prior to arrival.  ED Course: Upon arrival to the ED, patient is found to be afebrile, saturating mid 90s on room air, and with stable blood pressure.  EKG features a sinus rhythm, PACs, RBBB, LAFB, and is similar to prior.  Chest x-ray is negative for acute cardiopulmonary disease.  Noncontrast head CT is negative for acute intracranial abnormality.  There is no fracture on cervical spine CT.  Plain films of the right hip demonstrate comminuted impacted fracture of the proximal right femoral shaft.  Chemistry panel features and elevated BUN to creatinine ratio, slight elevation in AST, and total bilirubin 1.9.  CBC is notable for mild leukocytosis.  Patient was treated with Dilaudid and morphine in the ED and orthopedic surgery was consulted by the ED physician.  The patient was admitted to a telemetry bed. She was given pain control, IV fluids, and antiemetics. She was kept NPO after midnight. She was cleared for surgery. She underwent ORIF of the right hip today with orthopedic surgery. She has tolerated the procedure well.  Consultants  . Orthopedic surgery  Procedures  . ORIF of right hip.  Antibiotics   Anti-infectives (From admission, onward)   Start     Dose/Rate Route Frequency Ordered Stop   12/28/18 2200  ceFAZolin (ANCEF) IVPB 2g/100 mL premix     2  g 200 mL/hr over 30 Minutes Intravenous Every 6 hours 12/28/18 1731 12/29/18 0959   12/28/18 0600  ceFAZolin (ANCEF) IVPB 2g/100 mL premix     2 g 200 mL/hr over 30 Minutes Intravenous On call to O.R. 12/27/18 2349 12/28/18 1533    .   Subjective  The patient is resting quietly. No new complaints.  Objective   Vitals:  Vitals:   12/28/18 1700 12/28/18 1715  BP: (!) 158/77 (!) 160/83  Pulse: 72 74  Resp: 12 19  Temp:  98.4 F (36.9 C)  SpO2: 100% 100%    Exam:  Constitutional:  . The patient is awake, alert, and oriented x 3. No acute distress. Respiratory:  . No increased work of breathing. . No wheezes, rales, or rhonchi . No tactile fremitus Cardiovascular:  . Regular rate and rhythm . No murmurs, ectopy, or gallups. . No lateral PMI. No thrills. Abdomen:  . Abdomen is soft, non-tender, non-distended . No hernias, masses, or organomegaly . Normoactive bowel sounds.  Musculoskeletal:  . No cyanosis, clubbing, or edema Skin:  . No rashes, lesions, ulcers . palpation of skin: no induration or nodules Neurologic:  . CN 2-12 intact . Sensation all 4 extremities intact Psychiatric:  . Mental status o Mood, affect appropriate o Orientation to person, place, time  . judgment and insight appear intact  I have personally reviewed the following:   Today's Data  . Vitals, BMP, CBC .  Imaging  . Right hip  Cardiology Data  . EKG  Scheduled Meds: . acetaminophen  500 mg Oral Q6H  . [START ON 12/29/2018] aspirin EC  325 mg Oral Q breakfast  . busPIRone  10 mg Oral TID  . docusate sodium  100 mg Oral BID  . famotidine  20 mg Oral QHS  . feeding supplement (ENSURE ENLIVE)  237 mL Oral BID BM  . fentaNYL      . memantine  28 mg Oral Daily  . metoprolol succinate  25 mg Oral Daily  . multivitamin with minerals  1 tablet Oral Daily  . potassium chloride  10 mEq Oral BID  . potassium chloride  20 mEq Oral TID  . sertraline  50 mg Oral Daily  . simvastatin   20 mg Oral q1800   Continuous Infusions: .  ceFAZolin (ANCEF) IV    . lactated ringers 50 mL/hr at 12/28/18 1405  . methocarbamol (ROBAXIN) IV      Principal Problem:   Closed right hip fracture, initial encounter ([place]) Active Problems:   Essential hypertension   Depression with anxiety   Dementia (HCC)   LOS: 1 day   A & P   Right hip fracture: S/P ORIF. Care as per orthopedic surgery. She will likely require rehab.    Hypertension: Blood pressure under fair control with metoprolol as at home. Monitor. Diuretics on hold currently.  Hypokalemia: 2.9 this morning. Supplemented. Check BMP now and in the morning.  Dementia: At baseline, patient is reportedly confused, ambulates with a walker, feeds self. Continue Namenda as at home.  Depression, anxiety: Continue Zoloft, Buspar, and as-needed Xanax.  Chronic leg wound: Continue wound care  I have seen and examined this patient myself. I have spent 34 minutes in her evaluation and care.  PPE: Mask, face shield  DVT prophylaxis: SCD's  Code Status: Full code for surgery, then DNR/DNI per her POA  Family Communication:  None available at bedside. Amen Dargis, DO [facility] Direct contact: see [email or web address]  7PM-7AM contact night coverage as above 12/28/2018, 5:40 PM  LOS: 1 day

## 2018-12-28 NOTE — Anesthesia Preprocedure Evaluation (Addendum)
Anesthesia Evaluation  Patient identified by MRN, date of birth, ID band Patient awake and Patient confused    Reviewed: Allergy & Precautions, NPO status , Patient's Chart, lab work & pertinent test results  Airway Mallampati: II  TM Distance: >3 FB Neck ROM: Full    Dental no notable dental hx.    Pulmonary neg pulmonary ROS,    Pulmonary exam normal breath sounds clear to auscultation       Cardiovascular hypertension, Normal cardiovascular exam+ dysrhythmias Atrial Fibrillation  Rhythm:Regular Rate:Normal     Neuro/Psych Anxiety Depression Dementia negative neurological ROS     GI/Hepatic negative GI ROS, Neg liver ROS, GERD  ,  Endo/Other  negative endocrine ROS  Renal/GU negative Renal ROS  negative genitourinary   Musculoskeletal negative musculoskeletal ROS (+)   Abdominal   Peds negative pediatric ROS (+)  Hematology negative hematology ROS (+) anemia ,   Anesthesia Other Findings   Reproductive/Obstetrics negative OB ROS                            Anesthesia Physical Anesthesia Plan  ASA: III  Anesthesia Plan: General   Post-op Pain Management:    Induction: Intravenous  PONV Risk Score and Plan: 3 and Ondansetron and Treatment may vary due to age or medical condition  Airway Management Planned: Oral ETT  Additional Equipment:   Intra-op Plan:   Post-operative Plan: Extubation in OR  Informed Consent: I have reviewed the patients History and Physical, chart, labs and discussed the procedure including the risks, benefits and alternatives for the proposed anesthesia with the patient or authorized representative who has indicated his/her understanding and acceptance.       Plan Discussed with: CRNA and Anesthesiologist  Anesthesia Plan Comments:       Anesthesia Quick Evaluation

## 2018-12-28 NOTE — TOC Initial Note (Signed)
Transition of Care [facility]) - Initial/Assessment Note    Patient Details  Name: Jasmine Jones MRN: [id number] Date of Birth: 03/09/1925  Transition of Care [facility]) CM/SW Contact:    Trish Mage, LCSW Phone Number: 12/28/2018, 9:40 AM  Clinical Narrative: Ms Huertas was brought in from [facility] at [place], where she has been a resident for about a year.  Spoke with niece, who states patient was in rehab in the past at [facility] in [place], where she was last fall after a fall and subsequent brain bleed.  Ms Jaci Standard also conveyed the Ms Asano is hard of hearing, even with hearing aids, and has difficulty seeing because of macular degeneration.  I left a list of SNF's in patient's room for Ms Scotten to peruse.                Expected Discharge Plan: Skilled Nursing Facility Barriers to Discharge: Other (comment)(surgery)   Patient Goals and CMS Choice   CMS Medicare.gov Compare Post Acute Care list provided to:: Patient Represenative (must comment)(neice) Choice offered to / list presented to : (family)  Expected Discharge Plan and Services Expected Discharge Plan: [place] In-house Referral: Clinical Social Work   Post Acute Care Choice: [place] Living arrangements for the past 2 months: [place]                                      Prior Living Arrangements/Services Living arrangements for the past 2 months: [place] Lives with:: Facility Resident Patient language and need for interpreter reviewed:: Yes        Need for Family Participation in Patient Care: Yes (Comment) Care giver support system in place?: Yes (comment) Current home services: DME Criminal Activity/Legal Involvement Pertinent to Current Situation/Hospitalization: No - Comment as needed  Activities of Daily Living   ADL Screening (condition at time of admission) Patient's cognitive ability adequate to safely  complete daily activities?: No Is the patient deaf or have difficulty hearing?: No Does the patient have difficulty seeing, even when wearing glasses/contacts?: No Does the patient have difficulty concentrating, remembering, or making decisions?: Yes Patient able to express need for assistance with ADLs?: No Does the patient have difficulty dressing or bathing?: Yes Independently performs ADLs?: No Communication: Needs assistance Is this a change from baseline?: Pre-admission baseline Dressing (OT): Needs assistance Does the patient have difficulty walking or climbing stairs?: Yes Weakness of Legs: Right Weakness of Arms/Hands: None  Permission Sought/Granted Permission sought to share information with : Family Supports Permission granted to share information with : Yes, Verbal Permission Granted  Share Information with NAME: Layne Benton     Permission granted to share info w Relationship: neice  Permission granted to share info w Contact Information: [phone]  Emotional Assessment Appearance:: Appears stated age     Orientation: : Oriented to Self Alcohol / Substance Use: Not Applicable Psych Involvement: No (comment)  Admission diagnosis:  Closed fracture of right hip, initial encounter [facility]) [S72.001A] Patient Active Problem List   Diagnosis Date Noted  . Closed right hip fracture, initial encounter ([place]) 12/27/2018  . Depression with anxiety 12/27/2018  . Dementia ([place]) 12/27/2018  . GOITER, MULTINODULAR 08/15/2007  . CHEST PAIN 08/15/2007  . HYPERLIPIDEMIA 08/14/2007  . INSOMNIA, CHRONIC 08/14/2007  . Essential hypertension 08/14/2007  . ATRIAL FIBRILLATION 08/14/2007  . Osteoarthritis 08/14/2007  . OSTEOPOROSIS  08/14/2007   PCP:  [name], NP Pharmacy:   [facility] [place], [place] - [address] [place] [address] Phone: [phone] Fax: [phone]     Social Determinants of Health (SDOH) Interventions     Readmission Risk Interventions No flowsheet data found.

## 2018-12-28 NOTE — Transfer of Care (Signed)
Immediate Anesthesia Transfer of Care Note  Patient: Jasmine Jones  Procedure(s) Performed: INTRAMEDULLARY (IM) NAIL FEMORAL (Right Hip)  Patient Location: PACU  Anesthesia Type:General  Level of Consciousness: sedated, patient cooperative and responds to stimulation  Airway & Oxygen Therapy: Patient Spontanous Breathing and Patient connected to face mask oxygen  Post-op Assessment: Report given to RN and Post -op Vital signs reviewed and stable  Post vital signs: Reviewed and stable  Last Vitals:  Vitals Value Taken Time  BP 157/88 12/28/18 1636  Temp    Pulse 72 12/28/18 1639  Resp 20 12/28/18 1639  SpO2 100 % 12/28/18 1639  Vitals shown include unvalidated device data.  Last Pain:  Vitals:   12/28/18 1353  TempSrc: Oral  PainSc:          Complications: No apparent anesthesia complications

## 2018-12-28 NOTE — Progress Notes (Signed)
Bladder scan volume showed 860. [place], RN attempted to place foley with my assistance. Not able to get foley in at this time. Spoke with Dr. Benny Lennert and we will have another nurse to try. Patient is scheduled for surgery.  Will continue to monitor.

## 2018-12-28 NOTE — NC FL2 (Signed)
[employer] MEDICAID FL2 LEVEL OF CARE SCREENING TOOL     IDENTIFICATION  Patient Name: Jasmine Jones Birthdate: Oct 19, 1925 Sex: female Admission Date (Current Location): 12/27/2018  [facility] [place] Number:  Herbalist and Address:  [facility],  [place] [address] [place]      Provider Number: [id number]  Attending Physician Name and Address:  Karie Kirks, DO  Relative Name and Phone Number:       Current Level of Care: Hospital Recommended Level of Care: [place] Prior Approval Number:    Date Approved/Denied:   PASRR Number: [id number]  Discharge Plan: SNF    Current Diagnoses: Patient Active Problem List   Diagnosis Date Noted  . Closed right hip fracture, initial encounter ([place]) 12/27/2018  . Depression with anxiety 12/27/2018  . Dementia ([place]) 12/27/2018  . GOITER, MULTINODULAR 08/15/2007  . CHEST PAIN 08/15/2007  . HYPERLIPIDEMIA 08/14/2007  . INSOMNIA, CHRONIC 08/14/2007  . Essential hypertension 08/14/2007  . ATRIAL FIBRILLATION 08/14/2007  . Osteoarthritis 08/14/2007  . OSTEOPOROSIS 08/14/2007    Orientation RESPIRATION BLADDER Height & Weight     Self  Normal Incontinent Weight: 49.6 kg Height:  5\' 3"  (160 cm)  BEHAVIORAL SYMPTOMS/MOOD NEUROLOGICAL BOWEL NUTRITION STATUS  (none) (none) Incontinent Diet  AMBULATORY STATUS COMMUNICATION OF NEEDS Skin   Extensive Assist Verbally Surgical wounds                       Personal Care Assistance Level of Assistance  Bathing, Feeding, Dressing Bathing Assistance: Maximum assistance Feeding assistance: Independent Dressing Assistance: Maximum assistance     Functional Limitations Info  Sight, Hearing, Speech Sight Info: Impaired Hearing Info: Impaired Speech Info: Adequate    SPECIAL CARE FACTORS FREQUENCY  PT (By licensed PT)     PT Frequency: 5X/W              Contractures Contractures Info: Not present    Additional  Factors Info  Code Status, Allergies Code Status Info: full Allergies Info: NKA           Current Medications (12/28/2018):  This is the current hospital active medication list Current Facility-Administered Medications  Medication Dose Route Frequency Provider Last Rate Last Dose  . ALPRAZolam (XANAX) tablet 0.25 mg  0.25 mg Oral Q6H PRN Opyd, Ilene Qua, MD      . busPIRone (BUSPAR) tablet 10 mg  10 mg Oral TID Opyd, Ilene Qua, MD      . ceFAZolin (ANCEF) IVPB 2g/100 mL premix  2 g Intravenous On Call to OR Opyd, Ilene Qua, MD      . famotidine (PEPCID) tablet 20 mg  20 mg Oral QHS Opyd, Ilene Qua, MD   20 mg at 12/28/18 0104  . memantine (NAMENDA XR) 24 hr capsule 28 mg  28 mg Oral Daily Opyd, Ilene Qua, MD      . methocarbamol (ROBAXIN) tablet 500 mg  500 mg Oral Q6H PRN Opyd, Ilene Qua, MD       Or  . methocarbamol (ROBAXIN) 500 mg in dextrose 5 % 50 mL IVPB  500 mg Intravenous Q6H PRN Opyd, Ilene Qua, MD      . metoprolol succinate (TOPROL-XL) 24 hr tablet 25 mg  25 mg Oral Daily Opyd, Ilene Qua, MD   25 mg at 12/28/18 0941  . morphine 2 MG/ML injection 1-2 mg  1-2 mg Intravenous Q2H PRN Opyd, Ilene Qua, MD   2 mg at 12/28/18 0105  .  potassium chloride 10 mEq in 100 mL IVPB  10 mEq Intravenous Q1 Hr x 4 Swayze, Ava, DO 100 mL/hr at 12/28/18 1052 10 mEq at 12/28/18 1052  . potassium chloride SA (KLOR-CON) CR tablet 20 mEq  20 mEq Oral TID Vianne Bulls, MD   20 mEq at 12/28/18 0148  . povidone-iodine 10 % swab 2 application  2 application Topical Once Opyd, Ilene Qua, MD      . senna-docusate (Senokot-S) tablet 1 tablet  1 tablet Oral QHS PRN Opyd, Ilene Qua, MD      . sertraline (ZOLOFT) tablet 50 mg  50 mg Oral Daily Opyd, Ilene Qua, MD      . simvastatin (ZOCOR) tablet 20 mg  20 mg Oral q1800 Opyd, Ilene Qua, MD   20 mg at 12/28/18 0105     Discharge Medications: Please see discharge summary for a list of discharge medications.  Relevant Imaging Results:  Relevant Lab  Results:   Additional Information [place]  [place], [place]

## 2018-12-28 NOTE — Anesthesia Procedure Notes (Signed)
Procedure Name: Intubation Performed by: Dorenda Pfannenstiel J, CRNA Pre-anesthesia Checklist: Patient identified, Emergency Drugs available, Suction available, Patient being monitored and Timeout performed Patient Re-evaluated:Patient Re-evaluated prior to induction Oxygen Delivery Method: Circle system utilized Preoxygenation: Pre-oxygenation with 100% oxygen Induction Type: IV induction Ventilation: Mask ventilation without difficulty Laryngoscope Size: Mac and 4 Grade View: Grade I Tube type: Oral Tube size: 7.0 mm Number of attempts: 1 Airway Equipment and Method: Stylet Placement Confirmation: ETT inserted through vocal cords under direct vision,  positive ETCO2 and breath sounds checked- equal and bilateral Secured at: 21 cm Tube secured with: Tape Dental Injury: Teeth and Oropharynx as per pre-operative assessment        

## 2018-12-28 NOTE — Anesthesia Postprocedure Evaluation (Signed)
Anesthesia Post Note  Patient: Jasmine Jones  Procedure(s) Performed: INTRAMEDULLARY (IM) NAIL FEMORAL (Right Hip)     Patient location during evaluation: PACU Anesthesia Type: General Level of consciousness: awake and alert Pain management: pain level controlled Vital Signs Assessment: post-procedure vital signs reviewed and stable Respiratory status: spontaneous breathing, nonlabored ventilation, respiratory function stable and patient connected to nasal cannula oxygen Cardiovascular status: blood pressure returned to baseline and stable Postop Assessment: no apparent nausea or vomiting Anesthetic complications: no    Last Vitals:  Vitals:   12/28/18 1715 12/28/18 1740  BP: (!) 160/83 (!) 147/80  Pulse: 74 74  Resp: 19   Temp: 36.9 C 36.9 C  SpO2: 100% 98%    Last Pain:  Vitals:   12/28/18 1715  TempSrc:   PainSc: Asleep                 Montez Hageman

## 2018-12-28 NOTE — H&P (Signed)
History and Physical    Jasmine Jones DOB: 1925/04/16 DOA: 12/27/2018  PCP: [name], NP   Patient coming from: SNF   Chief Complaint: Fall, right hip pain   HPI: Jasmine Jones is a 85 y.o. female with medical history significant for hypertension, hyperlipidemia, dementia, depression, and anxiety, now presenting to the emergency department with right hip pain after an unwitnessed fall.  Patient is unable to contribute to the history due to her dementia.  At her baseline, she is reportedly confused, ambulates with a walker, and is able to feed herself.  She was complaining of right hip pain and brought into the ED for evaluation.  She received 100 mcg of fentanyl prior to arrival.  ED Course: Upon arrival to the ED, patient is found to be afebrile, saturating mid 90s on room air, and with stable blood pressure.  EKG features a sinus rhythm, PACs, RBBB, LAFB, and is similar to prior.  Chest x-ray is negative for acute cardiopulmonary disease.  Noncontrast head CT is negative for acute intracranial abnormality.  There is no fracture on cervical spine CT.  Plain films of the right hip demonstrate comminuted impacted fracture of the proximal right femoral shaft.  Chemistry panel features and elevated BUN to creatinine ratio, slight elevation in AST, and total bilirubin 1.9.  CBC is notable for mild leukocytosis.  Patient was treated with Dilaudid and morphine in the ED and orthopedic surgery was consulted by the ED physician.  Review of Systems:  Unable to complete ROS secondary to the patient's clinical condition.  Past Medical History:  Diagnosis Date   A-fib ([place])    Anemia    Anxiety    Dry skin    on ears   Gastroesophageal reflux    Hearing loss    significant  uses hearing aids   High blood pressure    Hyperlipidemia    Loss of appetite    Loss of smell    Macular degeneration    Malnutrition ([facility])    Osteoporosis    Persistent  dry cough    Skin cancer    basal / squamous   Slow transit constipation    abstracted from new patient packet    Vitamin D deficiency 01/26/2018    History reviewed. No pertinent surgical history.   reports that she has never smoked. She has never used smokeless tobacco. She reports that she does not drink alcohol or use drugs.  No Known Allergies  Family History  Problem Relation Age of Onset   Atrial fibrillation Sister    Diabetes Sister    Osteoarthritis Sister    Osteopenia Sister    Mental retardation Sister      Prior to Admission medications   Medication Sig Start Date End Date Taking? Authorizing Provider  acetaminophen (TYLENOL) 325 MG tablet To take tylenol 650 mg midday, can take every 6 hours as needed pain max 3000 mg in 24/hours 11/28/18  Yes Eubanks, Carlos American, NP  ALPRAZolam Duanne Moron) 0.25 MG tablet Take 0.25 mg by mouth every 6 (six) hours as needed for anxiety.   Yes [provider]  busPIRone (BUSPAR) 10 MG tablet Take 10 mg by mouth 3 (three) times daily.   Yes [provider]  calcium carbonate (OS-CAL) 600 MG TABS tablet Take 600 mg by mouth every 12 (twelve) hours.    Yes [provider]  cholecalciferol (VITAMIN D) 25 MCG (1000 UT) tablet Take 2,000 Units by mouth daily.   Yes  [provider]  diclofenac sodium (VOLTAREN) 1 % GEL Apply 1 g topically. 3-4 times daily   Yes [provider]  docusate sodium (COLACE) 100 MG capsule Take 100 mg by mouth every 12 (twelve) hours as needed for mild constipation.   Yes [provider]  Ensure (ENSURE) Take 237 mLs by mouth daily.   Yes [provider]  famotidine (PEPCID) 20 MG tablet Take 20 mg by mouth at bedtime.   Yes [provider]  hydrochlorothiazide (HYDRODIURIL) 25 MG tablet Take 25 mg by mouth daily.   Yes [provider]  loperamide (IMODIUM) 2 MG capsule Take 2 mg by mouth as needed for diarrhea or loose stools.    Yes [provider]  memantine (NAMENDA XR) 28 MG CP24 24 hr capsule Take 28 mg by mouth daily.   Yes [provider]  metoprolol succinate (TOPROL-XL) 25 MG 24 hr tablet Take 25 mg by mouth daily.    Yes [provider]  Multiple Vitamin (DAILY VITE) TABS Take 1 tablet by mouth daily.   Yes [provider]  Multiple Vitamins-Minerals (EQ VISION FORMULA 50+ PO) Take 1 tablet by mouth every 12 (twelve) hours.   Yes [provider]  Polyethyl Glycol-Propyl Glycol (SYSTANE) 0.4-0.3 % GEL ophthalmic gel Place 1 application into both eyes as needed. 2 drops into affected eyes every hour for dry eyes/itching   Yes [provider]  potassium chloride (K-DUR) 10 MEQ tablet Take 3 tablets (30 mEq total) by mouth daily. 10/25/18 12/27/18 Yes [name], NP  sertraline (ZOLOFT) 50 MG tablet Take 50 mg by mouth daily.   Yes [provider]  simvastatin (ZOCOR) 20 MG tablet Take 20 mg by mouth daily.   Yes [provider]  torsemide (DEMADEX) 20 MG tablet Take 1 tablet (20 mg total) by mouth daily. 10/25/18  Yes [name], NP  traMADol (ULTRAM) 50 MG tablet Take 1 tablet (50 mg total) by mouth 2 (two) times daily. 12/20/18  Yes [name], NP    Physical Exam: Vitals:   12/27/18 2131 12/27/18 2200 12/27/18 2230 12/27/18 2300  BP: (!) 163/93 126/77 125/62 127/61  Pulse: (!) 113 (!) 103 94 85  Resp: (!) 21 20 16 13   Temp:      TempSrc:      SpO2: 98% 96% 93% (!) 89%     Constitutional: NAD, calm  Eyes: PERTLA, lids and conjunctivae normal ENMT: Mucous membranes are moist. Posterior pharynx clear of any exudate or lesions.   Neck: normal, supple, no masses, no thyromegaly Respiratory:  no wheezing, no crackles. Normal respiratory effort. No accessory muscle use.  Cardiovascular: S1 & S2 heard, regular rate and rhythm. Mild edema to bilateral lower legs.   Abdomen: No distension, no tenderness, soft. Bowel  sounds normal.  Musculoskeletal: no clubbing / cyanosis. Right hip tender, neurovascularly intact distally.  Skin: no significant rashes, lesions, ulcers. Warm, dry, well-perfused. Neurologic: No gross facial asymmetry. Sensation intact. Moving all extremities.  Psychiatric: Alert. Disoriented, anxious.     Labs on Admission: I have personally reviewed following labs and imaging studies  CBC: Recent Labs  Lab 12/27/18 2014  WBC 11.7*  NEUTROABS 9.1*  HGB 12.8  HCT 40.3  MCV 84.3  PLT 123456   Basic Metabolic Panel: Recent Labs  Lab 12/27/18 2014  NA 137  K 4.2  CL 93*  CO2 30  GLUCOSE 120*  BUN 27*  CREATININE 0.84  CALCIUM 9.3  GFR: CrCl cannot be calculated (Unknown ideal weight.). Liver Function Tests: Recent Labs  Lab 12/27/18 2014  AST 51*  ALT 12  ALKPHOS 82  BILITOT 1.9*  PROT 6.5  ALBUMIN 3.6   No results for input(s): LIPASE, AMYLASE in the last 168 hours. No results for input(s): AMMONIA in the last 168 hours. Coagulation Profile: No results for input(s): INR, PROTIME in the last 168 hours. Cardiac Enzymes: No results for input(s): CKTOTAL, CKMB, CKMBINDEX, TROPONINI in the last 168 hours. BNP (last 3 results) No results for input(s): PROBNP in the last 8760 hours. HbA1C: No results for input(s): HGBA1C in the last 72 hours. CBG: No results for input(s): GLUCAP in the last 168 hours. Lipid Profile: No results for input(s): CHOL, HDL, LDLCALC, TRIG, CHOLHDL, LDLDIRECT in the last 72 hours. Thyroid Function Tests: No results for input(s): TSH, T4TOTAL, FREET4, T3FREE, THYROIDAB in the last 72 hours. Anemia Panel: No results for input(s): VITAMINB12, FOLATE, FERRITIN, TIBC, IRON, RETICCTPCT in the last 72 hours. Urine analysis:    Component Value Date/Time   COLORURINE STRAW (A) 09/16/2018 2340   APPEARANCEUR CLEAR 09/16/2018 2340   LABSPEC 1.006 09/16/2018 2340   PHURINE 7.0 09/16/2018 2340   GLUCOSEU NEGATIVE 09/16/2018 2340   HGBUR  NEGATIVE 09/16/2018 2340   BILIRUBINUR NEGATIVE 09/16/2018 2340   KETONESUR NEGATIVE 09/16/2018 2340   PROTEINUR NEGATIVE 09/16/2018 2340   NITRITE NEGATIVE 09/16/2018 2340   LEUKOCYTESUR NEGATIVE 09/16/2018 2340   Sepsis Labs: @LABRCNTIP (procalcitonin:4,lacticidven:4) ) Recent Results (from the past 240 hour(s))  SARS Coronavirus 2 by RT PCR (hospital order, performed in [place] hospital lab) Nasopharyngeal Nasopharyngeal Swab     Status: None   Collection Time: 12/27/18  9:29 PM   Specimen: Nasopharyngeal Swab  Result Value Ref Range Status   SARS Coronavirus 2 NEGATIVE NEGATIVE Final    Comment: (NOTE) If result is NEGATIVE SARS-CoV-2 target nucleic acids are NOT DETECTED. The SARS-CoV-2 RNA is generally detectable in upper and lower  respiratory specimens during the acute phase of infection. The lowest  concentration of SARS-CoV-2 viral copies this assay can detect is 250  copies / mL. A negative result does not preclude SARS-CoV-2 infection  and should not be used as the sole basis for treatment or other  patient management decisions.  A negative result may occur with  improper specimen collection / handling, submission of specimen other  than nasopharyngeal swab, presence of viral mutation(s) within the  areas targeted by this assay, and inadequate number of viral copies  (<250 copies / mL). A negative result must be combined with clinical  observations, patient history, and epidemiological information. If result is POSITIVE SARS-CoV-2 target nucleic acids are DETECTED. The SARS-CoV-2 RNA is generally detectable in upper and lower  respiratory specimens dur ing the acute phase of infection.  Positive  results are indicative of active infection with SARS-CoV-2.  Clinical  correlation with patient history and other diagnostic information is  necessary to determine patient infection status.  Positive results do  not rule out bacterial infection or co-infection with other  viruses. If result is PRESUMPTIVE POSTIVE SARS-CoV-2 nucleic acids MAY BE PRESENT.   A presumptive positive result was obtained on the submitted specimen  and confirmed on repeat testing.  While 2019 novel coronavirus  (SARS-CoV-2) nucleic acids may be present in the submitted sample  additional confirmatory testing may be necessary for epidemiological  and / or clinical management purposes  to differentiate between  SARS-CoV-2 and other Sarbecovirus currently known to  infect humans.  If clinically indicated additional testing with an alternate test  methodology [phone]) is advised. The SARS-CoV-2 RNA is generally  detectable in upper and lower respiratory sp ecimens during the acute  phase of infection. The expected result is Negative. Fact Sheet for Patients:  StrictlyIdeas.no Fact Sheet for Healthcare Providers: BankingDealers.co.za This test is not yet approved or cleared by the [place] FDA and has been authorized for detection and/or diagnosis of SARS-CoV-2 by FDA under an Emergency Use Authorization (EUA).  This EUA will remain in effect (meaning this test can be used) for the duration of the COVID-19 declaration under Section 564(b)(1) of the Act, 21 U.S.C. section 360bbb-3(b)(1), unless the authorization is terminated or revoked sooner. Performed at [facility], [place] [address] [place], [place] [address]      Radiological Exams on Admission: Dg Chest 1 View  Result Date: 12/27/2018 CLINICAL DATA:  85 year old female with history of fall.  Hip pain. EXAM: CHEST  1 VIEW COMPARISON:  Chest x-ray 11/13/2016. FINDINGS: Lung volumes are normal. No consolidative airspace disease. No pleural effusions. No pneumothorax. No pulmonary nodule or mass noted. Pulmonary vasculature and the cardiomediastinal silhouette are within normal limits. Atherosclerotic calcifications in the thoracic aorta. IMPRESSION: 1.  No  radiographic evidence of acute cardiopulmonary disease. 2. Aortic atherosclerosis. Electronically Signed   By: Vinnie Langton M.D.   On: 12/27/2018 21:00   Dg Pelvis 1-2 Views  Result Date: 12/27/2018 CLINICAL DATA:  Fall EXAM: PELVIS - 1-2 VIEW COMPARISON:  None. FINDINGS: There is a comminuted impacted fracture of the right proximal femur involving the greater and lesser trochanters. The femoral head still articulates with the acetabulum. No other definite fracture seen. IMPRESSION: Comminuted impacted proximal right femoral fracture involving the greater and lesser trochanters. Electronically Signed   By: Prudencio Pair M.D.   On: 12/27/2018 20:59   Ct Head Wo Contrast  Result Date: 12/27/2018 CLINICAL DATA:  Head trauma.  Fall EXAM: CT HEAD WITHOUT CONTRAST CT CERVICAL SPINE WITHOUT CONTRAST TECHNIQUE: Multidetector CT imaging of the head and cervical spine was performed following the standard protocol without intravenous contrast. Multiplanar CT image reconstructions of the cervical spine were also generated. COMPARISON:  CT head 09/17/2018 FINDINGS: CT HEAD FINDINGS Brain: Moderate atrophy. Chronic ischemic changes in the white matter. Chronic infarct left frontal lobe unchanged. Negative for acute infarct.  Negative for acute hemorrhage or mass Vascular: Negative for hyperdense vessel Skull:   Negative for fracture Sinuses/Orbits: Bubbly secretions in the right sphenoid sinus otherwise clear sinuses. Bilateral cataract surgery. Other: None CT CERVICAL SPINE FINDINGS Alignment: Mild retrolisthesis C3-4. Mild anterolisthesis C7-T1, T1-T2, T2-3 Skull base and vertebrae: Negative for fracture or mass Soft tissues and spinal canal: 14 mm right thyroid nodule. No adenopathy. Disc levels: Multilevel disc and facet degeneration throughout the cervical spine. No significant spinal stenosis. Upper chest: Irregular soft tissue density with associated calcification left upper lobe anteriorly. This was present  on prior chest CT October 22, 2017. Probable scarring Other: None IMPRESSION: Atrophy and chronic ischemic changes. No acute intracranial abnormality Negative for cervical spine fracture.  Cervical spondylosis 14 mm right thyroid nodule. No further imaging is indicated based on size of nodule and patient age. Irregular soft tissue density with calcification left upper lobe anteriorly, probable scarring. Electronically Signed   By: Franchot Gallo M.D.   On: 12/27/2018 20:40   Ct Cervical Spine Wo Contrast  Result Date: 12/27/2018 CLINICAL DATA:  Head trauma.  Fall EXAM: CT HEAD  WITHOUT CONTRAST CT CERVICAL SPINE WITHOUT CONTRAST TECHNIQUE: Multidetector CT imaging of the head and cervical spine was performed following the standard protocol without intravenous contrast. Multiplanar CT image reconstructions of the cervical spine were also generated. COMPARISON:  CT head 09/17/2018 FINDINGS: CT HEAD FINDINGS Brain: Moderate atrophy. Chronic ischemic changes in the white matter. Chronic infarct left frontal lobe unchanged. Negative for acute infarct.  Negative for acute hemorrhage or mass Vascular: Negative for hyperdense vessel Skull:   Negative for fracture Sinuses/Orbits: Bubbly secretions in the right sphenoid sinus otherwise clear sinuses. Bilateral cataract surgery. Other: None CT CERVICAL SPINE FINDINGS Alignment: Mild retrolisthesis C3-4. Mild anterolisthesis C7-T1, T1-T2, T2-3 Skull base and vertebrae: Negative for fracture or mass Soft tissues and spinal canal: 14 mm right thyroid nodule. No adenopathy. Disc levels: Multilevel disc and facet degeneration throughout the cervical spine. No significant spinal stenosis. Upper chest: Irregular soft tissue density with associated calcification left upper lobe anteriorly. This was present on prior chest CT October 22, 2017. Probable scarring Other: None IMPRESSION: Atrophy and chronic ischemic changes. No acute intracranial abnormality Negative for cervical spine  fracture.  Cervical spondylosis 14 mm right thyroid nodule. No further imaging is indicated based on size of nodule and patient age. Irregular soft tissue density with calcification left upper lobe anteriorly, probable scarring. Electronically Signed   By: Franchot Gallo M.D.   On: 12/27/2018 20:40   Dg Knee Complete 4 Views Right  Result Date: 12/27/2018 CLINICAL DATA:  Fall EXAM: RIGHT KNEE - COMPLETE 4+ VIEW COMPARISON:  None. FINDINGS: No definite fracture or dislocation. Tricompartmental osteoarthritis is seen most notable in the medial compartment with joint space loss and marginal osteophyte formation. No large knee joint effusion. Dense vascular calcifications are noted. IMPRESSION: No acute osseous abnormality. Electronically Signed   By: Prudencio Pair M.D.   On: 12/27/2018 21:00   Dg Femur Min 2 Views Right  Result Date: 12/27/2018 CLINICAL DATA:  Fall EXAM: RIGHT FEMUR 2 VIEWS COMPARISON:  None. FINDINGS: Comminuted impacted fracture of the proximal right femoral shaft involving the greater and lesser trochanters. Femoral head is still seated in the acetabulum. A healed fracture deformity of the fibula. 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 Dense vascular calcifications are noted. There is diffuse osteopenia. IMPRESSION: Comminuted impacted fracture of the proximal femoral shaft above the greater lesser trochanter. Electronically Signed   By: Prudencio Pair M.D.   On: 12/27/2018 21:01    EKG: Independently reviewed. Sinus rhythm, PAC's, RBBB, LAFB, similar to priors.   Assessment/Plan   1. Right hip fracture  - Presents with right hip pain after an unwitnessed fall and is found to have right proximal femur fracture  - Orthopedic surgery is consulting and much appreciated  - Based on the available data, Ms. Mignone presents an estimated 1.9% risk of perioperative MI or cardiac arrest  - Keep NPO, continue pain-control, CMS checks, supportive care, type and screen    2.  Hypertension  - BP at goal   - Hold diuretics while NPO, continue metoprolol as tolerated   3. Dementia  - At baseline, patient is reportedly confused, ambulates with a walker, feeds self  - Continue Namenda   4. Depression, anxiety  - Continue Zoloft, Buspar, and as-needed Xanax    5. Chronic leg wound - Continue wound care   PPE: Mask, face shield  DVT prophylaxis: SCD's  Code Status: Full code for surgery, then DNR/DNI per her POA  Family Communication:  Niece & POA, [name]  [place], was updated by phone  Consults called: Orthopedic surgery consulted by ED physician  Admission status: Inpatient    Vianne Bulls, MD [facility] Pager [phone]  If 7PM-7AM, please contact night-coverage [email or web address] Password [email or web address]  12/28/2018, 12:02 AM

## 2018-12-28 NOTE — Progress Notes (Signed)
Initial Nutrition Assessment  RD working remotely.   DOCUMENTATION CODES:   Not applicable  INTERVENTION:  - diet advancement as medically feasible post-op - will order Ensure Enlive BID for once diet advanced to at least FLD, each supplement provides 350 kcal and 20 grams of protein. - will order daily multivitamin with minerals.    NUTRITION DIAGNOSIS:   Increased nutrient needs related to post-op healing, acute illness as evidenced by estimated needs.  GOAL:   Patient will meet greater than or equal to 90% of their needs  MONITOR:   Diet advancement, PO intake, Supplement acceptance, Labs, Weight trends  REASON FOR ASSESSMENT:   Consult Hip fracture protocol  ASSESSMENT:   85 y.o. female with medical history significant for HTN, hyperlipidemia, dementia, depression, and anxiety. She presented to the ED on 10/22 due to R hip pain following an unwitnessed fall. At baseline, she is confused, ambulates with a walker, and is able to feed herself. CXR negative for acute cardiopulmonary disease. CT head negative for acute intracranial abnormality, no fractures to cervical spine. Found to have commuted impacted fracture of the R femoral shaft. Orthopedic Surgery consulted.  Patient has been NPO since admission. Flow sheet documentation indicates that patient is a/o to self only. RD did not enter patient's room given mentation and inability to have any PO items since PTA. Per chart review, current weight is 109 lb and weight has been stable for the past 3 months.  Per notes: - plan for ORIF with intramedullary rod some time today (10/23) - very HOH and, therefore, very difficult to communicate with patient   Labs reviewed; K: 2.9 mmol/l, Cl: 96 mmol/l. Medications reviewed; 20 mg oral pepcid/day, 1 g IV Mg sulfate x1 run 10/23, 10 mEq IV KCl x7 runs 10/23, 20 mEq Klor-Con TID.      NUTRITION - FOCUSED PHYSICAL EXAM:  unable to complete at this time.   Diet Order:   Diet  Order            Diet NPO time specified Except for: Sips with Meds, Ice Chips  Diet effective now              EDUCATION NEEDS:   No education needs have been identified at this time  Skin:  Skin Assessment: Reviewed RN Assessment  Last BM:  10/21 (day PTA)  Height:   Ht Readings from Last 1 Encounters:  12/28/18 5\' 3"  (1.6 m)    Weight:   Wt Readings from Last 1 Encounters:  12/28/18 49.6 kg    Ideal Body Weight:  52.3 kg  BMI:  Body mass index is 19.37 kg/m.  Estimated Nutritional Needs:   Kcal:  1485-1635 kcal  Protein:  60-75 grams  Fluid:  >/= 1.6 L/day      Jarome Matin, MS, RD, LDN, [facility] Inpatient Clinical Dietitian Pager # [phone] After hours/weekend pager # [phone]

## 2018-12-28 NOTE — Interval H&P Note (Signed)
History and Physical Interval Note:  12/28/2018 2:00 PM  Jasmine Jones  has presented today for surgery, with the diagnosis of Right Intertrocanteric fracture.  The various methods of treatment have been discussed with the patient and family. After consideration of risks, benefits and other options for treatment, the patient has consented to  Procedure(s): INTRAMEDULLARY (IM) NAIL FEMORAL (Right) as a surgical intervention.  The patient's history has been reviewed, patient examined, no change in status, stable for surgery.  I have reviewed the patient's chart and labs.  Questions were answered to the patient's satisfaction.     [name]

## 2018-12-29 LAB — BASIC METABOLIC PANEL
Anion gap: 10 (ref 5–15)
BUN: 15 mg/dL (ref 8–23)
CO2: 30 mmol/L (ref 22–32)
Calcium: 8.2 mg/dL — ABNORMAL LOW (ref 8.9–10.3)
Chloride: 97 mmol/L — ABNORMAL LOW (ref 98–111)
Creatinine, Ser: 0.46 mg/dL (ref 0.44–1.00)
GFR calc Af Amer: >60 mL/min (ref >60)
GFR calc non Af Amer: >60 mL/min (ref >60)
Glucose, Bld: 121 mg/dL — ABNORMAL HIGH (ref 70–99)
Potassium: 3.6 mmol/L (ref 3.5–5.1)
Sodium: 137 mmol/L (ref 135–145)

## 2018-12-29 LAB — CBC
HCT: 28.5 % — ABNORMAL LOW (ref 36.0–46.0)
Hemoglobin: 8.8 g/dL — ABNORMAL LOW (ref 12.0–15.0)
MCH: 26.7 pg (ref 26.0–34.0)
MCHC: 30.9 g/dL (ref 30.0–36.0)
MCV: 86.4 fL (ref 80.0–100.0)
Platelets: 127 10*3/uL — ABNORMAL LOW (ref 150–400)
RBC: 3.3 MIL/uL — ABNORMAL LOW (ref 3.87–5.11)
RDW: 13.5 % (ref 11.5–15.5)
WBC: 8.3 10*3/uL (ref 4.0–10.5)
nRBC: 0 % (ref 0.0–0.2)

## 2018-12-29 MED ORDER — CHLORHEXIDINE GLUCONATE CLOTH 2 % EX PADS
6.0000 | MEDICATED_PAD | Freq: Every day | CUTANEOUS | Status: DC
  Administered 2018-12-29 – 2019-01-01 (×4): 6 via TOPICAL

## 2018-12-29 NOTE — Progress Notes (Signed)
     Jasmine Jones is a 85 y.o. female   Orthopaedic diagnosis: Right peritrochanteric hip fracture status post intramedullary nailing on 12/28/2018  Subjective: Patient is resting comfortably in bed.  She does not answer questions.  Per nursing she is doing well.  Physical therapy has been called to help get her up out of bed.  Objectyive: Vitals:   12/29/18 0529 12/29/18 0827  BP: 127/81 (!) 115/55  Pulse: 82 72  Resp: 18 20  Temp: 98.4 F (36.9 C)   SpO2: 100% 100%     Exam: Awake and alert Respirations even and unlabored No acute distress  Right hip dressing in place without drainage.  Limb lengths appear symmetric grossly.  Assessment: Postop day 1 status post intramedullary nailing of right peritrochanteric hip fracture, doing well   Plan: Patient is touchdown weightbearing on the right lower extremity she will work with physical therapy for mobilization Likely will be transferred to a skilled nursing facility once medically appropriate and bed available Aspirin daily for DVT prophylaxis Pain management per medical team, would avoid narcotics in this elderly patient.   Radene Journey, MD

## 2018-12-29 NOTE — Plan of Care (Signed)
  Problem: Nutrition: Goal: Adequate nutrition will be maintained Outcome: Progressing   Problem: Elimination: Goal: Will not experience complications related to bowel motility Outcome: Progressing   Problem: Safety: Goal: Ability to remain free from injury will improve Outcome: Progressing   

## 2018-12-29 NOTE — Progress Notes (Signed)
PROGRESS NOTE  Jasmine Jones [id number] DOB: 02/25/1926 DOA: 12/27/2018 PCP: [name], NP  Brief History   Jasmine Jones is a 85 y.o. female with medical history significant for hypertension, hyperlipidemia, dementia, depression, and anxiety, now presenting to the emergency department with right hip pain after an unwitnessed fall.  Patient is unable to contribute to the history due to her dementia.  At her baseline, she is reportedly confused, ambulates with a walker, and is able to feed herself.  She was complaining of right hip pain and brought into the ED for evaluation.  She received 100 mcg of fentanyl prior to arrival.  ED Course: Upon arrival to the ED, patient is found to be afebrile, saturating mid 90s on room air, and with stable blood pressure.  EKG features a sinus rhythm, PACs, RBBB, LAFB, and is similar to prior.  Chest x-ray is negative for acute cardiopulmonary disease.  Noncontrast head CT is negative for acute intracranial abnormality.  There is no fracture on cervical spine CT.  Plain films of the right hip demonstrate comminuted impacted fracture of the proximal right femoral shaft.  Chemistry panel features and elevated BUN to creatinine ratio, slight elevation in AST, and total bilirubin 1.9.  CBC is notable for mild leukocytosis.  Patient was treated with Dilaudid and morphine in the ED and orthopedic surgery was consulted by the ED physician.  The patient was admitted to a telemetry bed. She was given pain control, IV fluids, and antiemetics. She was kept NPO after midnight. She was cleared for surgery. She underwent ORIF of the right hip today with orthopedic surgery. She has tolerated the procedure well.  Consultants  . Orthopedic surgery  Procedures  . ORIF of right hip.  Antibiotics   Anti-infectives (From admission, onward)   Start     Dose/Rate Route Frequency Ordered Stop   12/28/18 2200  ceFAZolin (ANCEF) IVPB 2g/100 mL premix     2  g 200 mL/hr over 30 Minutes Intravenous Every 6 hours 12/28/18 1731 12/29/18 0544   12/28/18 0600  ceFAZolin (ANCEF) IVPB 2g/100 mL premix     2 g 200 mL/hr over 30 Minutes Intravenous On call to O.R. 12/27/18 2349 12/28/18 1533      Subjective  The patient is resting quietly. No new complaints.  Objective   Vitals:  Vitals:   12/29/18 0529 12/29/18 0827  BP: 127/81 (!) 115/55  Pulse: 82 72  Resp: 18 20  Temp: 98.4 F (36.9 C)   SpO2: 100% 100%    Exam:  Constitutional:  . The patient is awake, alert, and oriented x 3. No acute distress. Respiratory:  . No increased work of breathing. . No wheezes, rales, or rhonchi . No tactile fremitus Cardiovascular:  . Regular rate and rhythm . No murmurs, ectopy, or gallups. . No lateral PMI. No thrills. Abdomen:  . Abdomen is soft, non-tender, non-distended . No hernias, masses, or organomegaly . Normoactive bowel sounds.  Musculoskeletal:  . No cyanosis, clubbing, or edema Skin:  . No rashes, lesions, ulcers . palpation of skin: no induration or nodules Neurologic:  . CN 2-12 intact . Sensation all 4 extremities intact Psychiatric:  . Mental status o Mood, affect appropriate o Orientation to person, place, time  . judgment and insight appear intact  I have personally reviewed the following:   Today's Data  . Vitals, BMP, CBC .  Imaging  . Right hip  Cardiology Data  . EKG  Scheduled Meds: . acetaminophen  500  mg Oral Q6H  . aspirin EC  325 mg Oral Q breakfast  . busPIRone  10 mg Oral TID  . Chlorhexidine Gluconate Cloth  6 each Topical Daily  . docusate sodium  100 mg Oral BID  . famotidine  20 mg Oral QHS  . feeding supplement (ENSURE ENLIVE)  237 mL Oral BID BM  . memantine  28 mg Oral Daily  . metoprolol succinate  25 mg Oral Daily  . multivitamin with minerals  1 tablet Oral Daily  . potassium chloride  10 mEq Oral BID  . sertraline  50 mg Oral Daily  . simvastatin  20 mg Oral q1800    Continuous Infusions: . lactated ringers 50 mL/hr at 12/28/18 1827  . methocarbamol (ROBAXIN) IV      Principal Problem:   Closed right hip fracture, initial encounter ([place]) Active Problems:   Essential hypertension   Depression with anxiety   Dementia (HCC)   LOS: 2 days   A & P   Right hip fracture: S/P ORIF. Care as per orthopedic surgery. She will likely require rehab.    Hypertension: Blood pressure under fair control with metoprolol as at home. Monitor. Diuretics on hold currently.  Hypokalemia: 2.9 this morning. Supplemented. Check BMP now and in the morning.  Anemia: Post operative drop of hemoglobin from 10.5 to 8.8. Monitor.  Dementia: At baseline, patient is reportedly confused, ambulates with a walker, feeds self. Continue Namenda as at home.  Depression, anxiety: Continue Zoloft, Buspar, and as-needed Xanax.  Chronic leg wound: Continue wound care  I have seen and examined this patient myself. I have spent 30 minutes in her evaluation and care.  PPE: Mask, face shield  DVT prophylaxis: SCD's  Code Status: Full code for surgery, then DNR/DNI per her POA  Family Communication:  None available at bedside. Abhiraj Dozal, DO [facility] Direct contact: see [email or web address]  7PM-7AM contact night coverage as above 12/29/2018, 1:55 PM  LOS: 1 day

## 2018-12-29 NOTE — Evaluation (Signed)
Physical Therapy Evaluation Patient Details Name: Jasmine Jones MRN: [id number] DOB: 1925-09-30 Today's Date: 12/29/2018   History of Present Illness  85 y.o. female with medical history significant for hypertension, hyperlipidemia, dementia, depression, and anxiety, now presenting to the emergency department with right hip pain after unwitnessed fall at [facility]; s/p IM nail on 12/28/18  Clinical Impression  Pt admitted with above diagnosis.  Per pt niece pt was ambulatory with RW at memory care prior to fall.  Only able to sit EOB today d/t pt with  incr pain with mobility as well as incr anxiety sitting EOB. Pt was cooperative despite pain level, requiring +2 for bed mobility, will continue to follow in acute setting.    Pt currently with functional limitations due to the deficits listed below (see PT Problem List). Pt will benefit from skilled PT to increase their independence and safety with mobility to allow discharge to the venue listed below.       Follow Up Recommendations SNF    Equipment Recommendations  None recommended by PT    Recommendations for Other Services       Precautions / Restrictions Precautions Precautions: Fall Restrictions Weight Bearing Restrictions: Yes RLE Weight Bearing: Touchdown weight bearing      Mobility  Bed Mobility Overal bed mobility: Needs Assistance Bed Mobility: Supine to Sit;Sit to Supine     Supine to sit: Mod assist;+2 for physical assistance;+2 for safety/equipment Sit to supine: +2 for physical assistance;+2 for safety/equipment;Max assist   General bed mobility comments: assist with LEs and trunk, incr time  Transfers                 General transfer comment: NT d/t pain. pt very fearful of falling  Ambulation/Gait                Stairs            Wheelchair Mobility    Modified Rankin (Stroke Patients Only)       Balance Overall balance assessment: Needs  assistance Sitting-balance support: Feet supported;Bilateral upper extremity supported Sitting balance-Leahy Scale: Fair Sitting balance - Comments: assist to maintain midline Postural control: Posterior lean                                   Pertinent Vitals/Pain Pain Assessment: Faces Faces Pain Scale: Hurts even more Pain Location: right LE Pain Descriptors / Indicators: Grimacing;Crying Pain Intervention(s): Limited activity within patient's tolerance;Monitored during session;Repositioned    Home Living Family/patient expects to be discharged to:: Skilled nursing facility                      Prior Function Level of Independence: Needs assistance   Gait / Transfers Assistance Needed: amb with RW at baseline per pt niece           Hand Dominance        Extremity/Trunk Assessment   Upper Extremity Assessment Upper Extremity Assessment: Generalized weakness    Lower Extremity Assessment Lower Extremity Assessment: RLE deficits/detail;LLE deficits/detail RLE Deficits / Details: ankle grossly WFL,  knee and hip limited by pain LLE Deficits / Details: AROM grossly WFL,  dressing on lower leg       Communication   Communication: HOH  Cognition Arousal/Alertness: Awake/alert Behavior During Therapy: Restless Overall Cognitive Status: History of cognitive impairments - at baseline  General Comments      Exercises     Assessment/Plan    PT Assessment Patient needs continued PT services  PT Problem List Decreased strength;Decreased activity tolerance;Decreased knowledge of use of DME;Decreased mobility;Decreased knowledge of precautions;Decreased balance;Pain       PT Treatment Interventions DME instruction;Therapeutic activities;Functional mobility training;Therapeutic exercise;Gait training;Patient/family education;Balance training    PT Goals (Current goals can be found in the  Care Plan section)  Acute Rehab PT Goals PT Goal Formulation: Patient unable to participate in goal setting Time For Goal Achievement: 01/11/19 Potential to Achieve Goals: Fair    Frequency Min 2X/week   Barriers to discharge        Co-evaluation               AM-PAC PT "6 Clicks" Mobility  Outcome Measure Help needed turning from your back to your side while in a flat bed without using bedrails?: A Lot Help needed moving from lying on your back to sitting on the side of a flat bed without using bedrails?: A Lot Help needed moving to and from a bed to a chair (including a wheelchair)?: Total Help needed standing up from a chair using your arms (e.g., wheelchair or bedside chair)?: Total Help needed to walk in hospital room?: Total Help needed climbing 3-5 steps with a railing? : Total 6 Click Score: 8    End of Session   Activity Tolerance: Patient limited by pain Patient left: in bed;with call bell/phone within reach;with bed alarm set;with family/visitor present Nurse Communication: Mobility status PT Visit Diagnosis: Muscle weakness (generalized) (M62.81);Other abnormalities of gait and mobility (R26.89)    Time: 1053-1110 PT Time Calculation (min) (ACUTE ONLY): 17 min   Charges:   PT Evaluation $PT Eval Low Complexity: 1 Low          Kenyon Ana, PT  Pager: [phone] Acute Rehab Dept [facility]): [phone]   12/29/2018   [facility] 12/29/2018, 12:52 PM

## 2018-12-30 ENCOUNTER — Inpatient Hospital Stay (HOSPITAL_COMMUNITY): Payer: Medicare Other

## 2018-12-30 LAB — BASIC METABOLIC PANEL
Anion gap: 8 (ref 5–15)
BUN: 16 mg/dL (ref 8–23)
CO2: 30 mmol/L (ref 22–32)
Calcium: 8 mg/dL — ABNORMAL LOW (ref 8.9–10.3)
Chloride: 98 mmol/L (ref 98–111)
Creatinine, Ser: 0.33 mg/dL — ABNORMAL LOW (ref 0.44–1.00)
GFR calc Af Amer: >60 mL/min (ref >60)
GFR calc non Af Amer: >60 mL/min (ref >60)
Glucose, Bld: 103 mg/dL — ABNORMAL HIGH (ref 70–99)
Potassium: 3.7 mmol/L (ref 3.5–5.1)
Sodium: 136 mmol/L (ref 135–145)

## 2018-12-30 LAB — CBC
HCT: 26 % — ABNORMAL LOW (ref 36.0–46.0)
Hemoglobin: 8.2 g/dL — ABNORMAL LOW (ref 12.0–15.0)
MCH: 27.6 pg (ref 26.0–34.0)
MCHC: 31.5 g/dL (ref 30.0–36.0)
MCV: 87.5 fL (ref 80.0–100.0)
Platelets: 111 10*3/uL — ABNORMAL LOW (ref 150–400)
RBC: 2.97 MIL/uL — ABNORMAL LOW (ref 3.87–5.11)
RDW: 13.4 % (ref 11.5–15.5)
WBC: 9.5 10*3/uL (ref 4.0–10.5)
nRBC: 0 % (ref 0.0–0.2)

## 2018-12-30 NOTE — Progress Notes (Signed)
This shift pt was unable to void 6 hours after foley removal. Bladder scan showed 355 mls. at 0500.hospitalist paged in and out cath ordered and 350 ml's removed. Pt very anxious but tolerated. Will continue to monitor

## 2018-12-30 NOTE — Progress Notes (Signed)
Bladder scan revealed 736cc. Have placed a 14Fr. Urethral catheter without difficulty. Pt's caregiver in room.

## 2018-12-30 NOTE — Progress Notes (Signed)
Rt was checking O2 and pt was found to be 80% on RA. Rt placed 2 LPM [place] on pt sats 95%. RN was at bedside as will.

## 2018-12-30 NOTE — Progress Notes (Signed)
PROGRESS NOTE  Jasmine Jones [id number] DOB: June 11, 1925 DOA: 12/27/2018 PCP: Jasmine Chandler, NP  Brief History   Jasmine Jones is a 85 y.o. female with medical history significant for hypertension, hyperlipidemia, dementia, depression, and anxiety, now presenting to the emergency department with right hip pain after an unwitnessed fall.  Patient is unable to contribute to the history due to her dementia.  At her baseline, she is reportedly confused, ambulates with a walker, and is able to feed herself.  She was complaining of right hip pain and brought into the ED for evaluation.  She received 100 mcg of fentanyl prior to arrival.  ED Course: Upon arrival to the ED, patient is found to be afebrile, saturating mid 90s on room air, and with stable blood pressure.  EKG features a sinus rhythm, PACs, RBBB, LAFB, and is similar to prior.  Chest x-ray is negative for acute cardiopulmonary disease.  Noncontrast head CT is negative for acute intracranial abnormality.  There is no fracture on cervical spine CT.  Plain films of the right hip demonstrate comminuted impacted fracture of the proximal right femoral shaft.  Chemistry panel features and elevated BUN to creatinine ratio, slight elevation in AST, and total bilirubin 1.9.  CBC is notable for mild leukocytosis.  Patient was treated with Dilaudid and morphine in the ED and orthopedic surgery was consulted by the ED physician.  The patient was admitted to a telemetry bed. She was given pain control, IV fluids, and antiemetics. She was kept NPO after midnight. She was cleared for surgery. She underwent ORIF of the right hip today with orthopedic surgery. She has tolerated the procedure well.  Patient with new hypoxia this morning. CXR is pending.  Consultants  . Orthopedic surgery  Procedures  . ORIF of right hip.  Antibiotics   Anti-infectives (From admission, onward)   Start     Dose/Rate Route Frequency Ordered Stop    12/28/18 2200  ceFAZolin (ANCEF) IVPB 2g/100 mL premix     2 g 200 mL/hr over 30 Minutes Intravenous Every 6 hours 12/28/18 1731 12/29/18 0544   12/28/18 0600  ceFAZolin (ANCEF) IVPB 2g/100 mL premix     2 g 200 mL/hr over 30 Minutes Intravenous On call to O.R. 12/27/18 2349 12/28/18 1533      Subjective  The patient is resting quietly. No new complaints.  Objective   Vitals:  Vitals:   12/30/18 0852 12/30/18 1355  BP:  119/60  Pulse:  70  Resp:  18  Temp:  (!) 97.5 F (36.4 C)  SpO2: 95%     Exam:  Constitutional:  . The patient is awake, alert, and oriented x 3. No acute distress. Respiratory:  . No increased work of breathing. . No wheezes, rales, or rhonchi . No tactile fremitus Cardiovascular:  . Regular rate and rhythm . No murmurs, ectopy, or gallups. . No lateral PMI. No thrills. Abdomen:  . Abdomen is soft, non-tender, non-distended . No hernias, masses, or organomegaly . Normoactive bowel sounds.  Musculoskeletal:  . No cyanosis, clubbing, or edema Skin:  . No rashes, lesions, ulcers . palpation of skin: no induration or nodules Neurologic:  . CN 2-12 intact . Sensation all 4 extremities intact Psychiatric:  . Mental status o Mood, affect appropriate o Orientation to person, place, time  . judgment and insight appear intact  I have personally reviewed the following:   Today's Data  . Vitals, BMP, CBC .  Imaging  . Right hip  Cardiology Data  .  EKG  Scheduled Meds: . aspirin EC  325 mg Oral Q breakfast  . busPIRone  10 mg Oral TID  . Chlorhexidine Gluconate Cloth  6 each Topical Daily  . docusate sodium  100 mg Oral BID  . famotidine  20 mg Oral QHS  . feeding supplement (ENSURE ENLIVE)  237 mL Oral BID BM  . memantine  28 mg Oral Daily  . metoprolol succinate  25 mg Oral Daily  . multivitamin with minerals  1 tablet Oral Daily  . potassium chloride  10 mEq Oral BID  . sertraline  50 mg Oral Daily  . simvastatin  20 mg Oral q1800    Continuous Infusions: . lactated ringers 50 mL/hr at 12/30/18 1240  . methocarbamol (ROBAXIN) IV      Principal Problem:   Closed right hip fracture, initial encounter ([place]) Active Problems:   Essential hypertension   Depression with anxiety   Dementia (HCC)   LOS: 3 days   A & P   Right hip fracture: S/P ORIF. Care as per orthopedic surgery. She will likely require rehab.    Hypertension: Blood pressure under fair control with metoprolol as at home. Monitor. Diuretics on hold currently.  Hypoxemia: portable chest x-ray is pending. Pt is now requiring 2L to maintain oxygen saturation in the low to mid nineties.   Hypokalemia: Resolved. Supplemented. Monitor.  Anemia: Post operative drop of hemoglobin from 10.5 to 8.2. Monitor.  Dementia: At baseline, patient is reportedly confused, ambulates with a walker, feeds self. Continue Namenda as at home.  Depression, anxiety: Continue Zoloft, Buspar, and as-needed Xanax.  Chronic leg wound: Continue wound care  I have seen and examined this patient myself. I have spent 32 minutes in her evaluation and care.  PPE: Mask, face shield  DVT prophylaxis: SCD's  Code Status: Full code for surgery, then DNR/DNI per her POA  Family Communication:  None available at bedside. Jasmine Rihn, DO [facility] Direct contact: see [email or web address]  7PM-7AM contact night coverage as above 12/30/2018, 2:26 PM  LOS: 1 day

## 2018-12-31 ENCOUNTER — Encounter (HOSPITAL_COMMUNITY): Payer: Self-pay | Admitting: Orthopedic Surgery

## 2018-12-31 LAB — BASIC METABOLIC PANEL
Anion gap: 8 (ref 5–15)
BUN: 12 mg/dL (ref 8–23)
CO2: 29 mmol/L (ref 22–32)
Calcium: 7.8 mg/dL — ABNORMAL LOW (ref 8.9–10.3)
Chloride: 99 mmol/L (ref 98–111)
Creatinine, Ser: 0.45 mg/dL (ref 0.44–1.00)
GFR calc Af Amer: >60 mL/min (ref >60)
GFR calc non Af Amer: >60 mL/min (ref >60)
Glucose, Bld: 99 mg/dL (ref 70–99)
Potassium: 3.6 mmol/L (ref 3.5–5.1)
Sodium: 136 mmol/L (ref 135–145)

## 2018-12-31 LAB — CBC
HCT: 22.2 % — ABNORMAL LOW (ref 36.0–46.0)
Hemoglobin: 6.8 g/dL — CL (ref 12.0–15.0)
MCH: 27.2 pg (ref 26.0–34.0)
MCHC: 30.6 g/dL (ref 30.0–36.0)
MCV: 88.8 fL (ref 80.0–100.0)
Platelets: 113 10*3/uL — ABNORMAL LOW (ref 150–400)
RBC: 2.5 MIL/uL — ABNORMAL LOW (ref 3.87–5.11)
RDW: 13.4 % (ref 11.5–15.5)
WBC: 8.6 10*3/uL (ref 4.0–10.5)
nRBC: 0 % (ref 0.0–0.2)

## 2018-12-31 LAB — HEMOGLOBIN AND HEMATOCRIT, BLOOD
HCT: 25.9 % — ABNORMAL LOW (ref 36.0–46.0)
Hemoglobin: 8.2 g/dL — ABNORMAL LOW (ref 12.0–15.0)

## 2018-12-31 LAB — PREPARE RBC (CROSSMATCH)

## 2018-12-31 MED ORDER — POLYETHYLENE GLYCOL 3350 17 G PO PACK
17.0000 g | PACK | Freq: Once | ORAL | Status: AC
  Administered 2018-12-31: 17 g via ORAL
  Filled 2018-12-31: qty 1

## 2018-12-31 MED ORDER — LIP MEDEX EX OINT
TOPICAL_OINTMENT | CUTANEOUS | Status: AC
  Administered 2018-12-31: 14:00:00
  Filled 2018-12-31: qty 7

## 2018-12-31 MED ORDER — SODIUM CHLORIDE 0.9% IV SOLUTION
Freq: Once | INTRAVENOUS | Status: AC
  Administered 2018-12-31: 10:00:00 via INTRAVENOUS

## 2018-12-31 MED ORDER — HALOPERIDOL LACTATE 5 MG/ML IJ SOLN
1.0000 mg | Freq: Four times a day (QID) | INTRAMUSCULAR | Status: DC | PRN
  Administered 2018-12-31: 1 mg via INTRAVENOUS
  Filled 2018-12-31 (×2): qty 1

## 2018-12-31 NOTE — Progress Notes (Signed)
Subjective: 3 Days Post-Op Procedure(s) (LRB): INTRAMEDULLARY (IM) NAIL FEMORAL (Right) Patient reports pain as Unable to communicate with patient and she is unable to hear or see..    Objective: Vital signs in last 24 hours: Temp:  [97.5 F (36.4 C)-98.4 F (36.9 C)] 97.9 F (36.6 C) (10/26 0536) Pulse Rate:  [70-82] 82 (10/26 0536) Resp:  [18-19] 19 (10/26 0536) BP: (101-119)/(54-89) 117/54 (10/26 0536) SpO2:  [80 %-100 %] 99 % (10/26 0536)  Intake/Output from previous day: 10/25 0701 - 10/26 0700 In: -  Out: 800 [Urine:800] Intake/Output this shift: No intake/output data recorded.  Recent Labs    12/28/18 1424 12/29/18 0542 12/30/18 0527 12/31/18 0527  HGB 10.5* 8.8* 8.2* 6.8*   Recent Labs    12/30/18 0527 12/31/18 0527  WBC 9.5 8.6  RBC 2.97* 2.50*  HCT 26.0* 22.2*  PLT 111* 113*   Recent Labs    12/30/18 0527 12/31/18 0527  NA 136 136  K 3.7 3.6  CL 98 99  CO2 30 29  BUN 16 12  CREATININE 0.33* 0.45  GLUCOSE 103* 99  CALCIUM 8.0* 7.8*   No results for input(s): LABPT, INR in the last 72 hours.  Neurologically intact ABD soft Neurovascular intact Intact pulses distally Dorsiflexion/Plantar flexion intact Right leg is swollen but not tense.  She does have pain with range of motion of the leg and knee but unclear as to how much of that is pain and how much is anxiety.   Assessment/Plan: 3 Days Post-Op Procedure(s) (LRB): INTRAMEDULLARY (IM) NAIL FEMORAL (Right) Advance diet Up with therapy Discharge to SNF Rapid drop in hemoglobin//will need transfusion if the family is wanting to be aggressive in terms of postoperative care.  The patient will obviously need discharge to skilled nursing facility.  I will see her back in the office in 2 weeks for recheck.     [name] 12/31/2018, 8:05 AM

## 2018-12-31 NOTE — Op Note (Signed)
Jasmine Jones, Jasmine Jones MEDICAL RECORD [id number] ACCOUNT [id number] DATE OF BIRTH:[birth year] FACILITY: [facility] LOCATION: [facility] PHYSICIAN:Elmus Mathes L. Jeana Kersting, MD  OPERATIVE REPORT  DATE OF PROCEDURE:  12/29/2018  PREOPERATIVE DIAGNOSIS:  Subtrochanteric hip fracture with intertrochanteric extension, right.  POSTOPERATIVE DIAGNOSIS:  Subtrochanteric hip fracture with intertrochanteric extension, right.  PROCEDURE:   1.  Open reduction internal fixation of right intertrochanteric/subtrochanteric hip fracture with an intramedullary rod and 2 distal locking screws.   2.  Interpretation of multiple intraoperative fluoroscopic images.  SURGEON:  Dorna Leitz, MD  ASSISTANT:  Gaspar Skeeters, PA-C, was present for the entire case and assisted by manipulation of the leg, manipulation of the tissues, and closing to minimize OR time.  BRIEF HISTORY:  The patient is a 85 year old female with unfortunate history of falling.  She suffered a subtrochanteric hip fracture with intertrochanteric extension.  We evaluated the patient and had a long discussion with her niece.  The patient was  unable to really see or hear so it was difficult to try to have any discussion with her.  Her niece did wish to proceed with open reduction internal fixation and we thought it was appropriate although we knew this was going to be a complex surgery.  She  was brought to the operating room for this procedure.  DESCRIPTION OF PROCEDURE:  The patient brought to the operating room after adequate anesthesia obtained with general anesthetic, the patient was placed supine on the operating table.  She was then placed on the Hana bed with the left leg in well leg  holder.  Attention was then turned to the right hip where after routine prep and drape fluoroscopy was used to identify the location of the fracture.  We then used careful measurements from the outside to get a sense of where we could most make our  limited incisions.  I  then made a small incision over where the proximal screw would be placed and got a bone hook and put it around the femur with traction and manipulative reduction.  We were actually able to get essentially an anatomic reduction.  At  that point while that was being held, I got a good entry point and we then overreamed that and then put a guidewire down, measured the rod, and then advanced the rod  after reaming up to a level of 12.5 mm.  We put an 11 mm rod down the patient.  This  was again complicated, but we did get a reasonable reduction.  Again, still holding the fracture in reduced position and now with a Cobb from the same incision, supporting under the neck, we were able to get a single screw, the big screw, up into the  head, had it in the posterior, inferior position as I thought cutout could be a concern for this patient.  We got really nice fixation there.    We then went distally and did 2 freehand interlocking screws under fluoroscopic guidance just because the subtroch component, I wanted to make sure she was length stable.  At this point, the wounds were irrigated, suctioned dry, closed in layers.   Sterile compressive dressing was applied over each of the small stab incisions and the patient was taken to recovery.  She was noted to be in satisfactory condition.  Estimated blood loss for procedure was probably 174mL, but most of this, I think, had  been retained from hematoma in the leg.  At this point, the patient was taken to the recovery room.  She  was noted to be in satisfactory condition.  CN/NUANCE  D:12/31/2018 T:12/31/2018 JOB:[id number]

## 2018-12-31 NOTE — Progress Notes (Addendum)
CRITICAL VALUE ALERT  Critical Value:  Hgb 6.8  Date & Time Notied:  12/31/2018 0630   Provider Notified: On call MD Jani Gravel  Orders Received/Actions taken:

## 2018-12-31 NOTE — Progress Notes (Signed)
PROGRESS NOTE    Jasmine Jones  [id number] DOB: 1925/11/16 DOA: 12/27/2018 PCP: [name], NP    Brief Narrative:   Jasmine Jones is a 85 y.o. female with medical history significant for hypertension, hyperlipidemia, dementia, depression, and anxiety, now presenting to the emergency department with right hip pain after an unwitnessed fall.  Patient is unable to contribute to the history due to her dementia.  At her baseline, she is reportedly confused, ambulates with a walker, and is able to feed herself.  She was complaining of right hip pain and brought into the ED for evaluation.  She received 100 mcg of fentanyl prior to arrival.  ED Course: Upon arrival to the ED, patient is found to be afebrile, saturating mid 90s on room air, and with stable blood pressure.  EKG features a sinus rhythm, PACs, RBBB, LAFB, and is similar to prior.  Chest x-ray is negative for acute cardiopulmonary disease.  Noncontrast head CT is negative for acute intracranial abnormality.  There is no fracture on cervical spine CT.  Plain films of the right hip demonstrate comminuted impacted fracture of the proximal right femoral shaft.  Chemistry panel features and elevated BUN to creatinine ratio, slight elevation in AST, and total bilirubin 1.9.  CBC is notable for mild leukocytosis.  Patient was treated with Dilaudid and morphine in the ED and orthopedic surgery was consulted by the ED physician.   Assessment & Plan:   Principal Problem:   Closed right hip fracture, initial encounter [facility]) Active Problems:   Essential hypertension   Depression with anxiety   Dementia ([place])   Right closed comminuted/impacted hip fracture:  Right hip x-ray with comminuted impacted fracture of the proximal femoral shaft.  Underwent ORIF with IM nail by Dr. Berenice Primas on 12/28/2018. --Aspirin 325 mg p.o. daily for DVT prophylaxis per orthopedics --Pain control with tramadol 50 mg every 6 hours as needed,  Robaxin 500 mg every 6 hours as needed muscle spasms --Norco 5-325 mg every 6 hours as needed; try to avoid if possible --Follow-up with outpatient orthopedics 2 weeks for recheck --Pending SNF placement  Essential hypertension:  Blood pressure 117/54 this morning, well controlled. --Continue metoprolol succinate 25 mg p.o. daily  Hypoxemia:  Chest x-ray with confluent/interstitial opacities bilateral lung base consistent with atelectasis versus infection.  Patient is afebrile without leukocytosis.  Etiology likely postoperative atelectasis. --Currently on 2 L nasal cannula satting 99%. --Titrate supplemental oxygen to maintain SPO2 greater than 92% --Incentive spirometry every hour while awake  Hypokalemia: Resolved. --Repleted during hospitalization, potassium 3.6 today --Repeat electrolytes in a.m. to include magnesium  Postoperative blood loss anemia:  --Post operative drop of hemoglobin from 10.5-->8.2-->6.8 today --Transfuse 1 unit PRBC today --Follow CBC daily  Dementia:  At baseline, patient is reportedly confused, ambulates with a walker, feeds self.  --Continue Namendaas at home. --Haldol IV prn  Depression, anxiety:  Continue Zoloft, Buspar, and as-needed Xanax.  Chronic leg wound:  Continue wound care  Hard of hearing: --Supportive care, visual cues   DVT prophylaxis: Aspirin, SCDs Code Status: Full code Family Communication: None present at bedside Disposition Plan: Pending [facility], insurance authorization pending   Consultants:   [facility], Dr. Berenice Primas  Procedures:   Right hip ORIF with IM nail on 12/28/2018 by Dr. Berenice Primas  Antimicrobials:   Perioperative antibiotics   Subjective: Patient seen and examined bedside, resting comfortably.  Very difficult in terms of communication given her issues with hearing.  Pleasantly confused.  Hemoglobin  dropped to 6.8 today, will transfuse 1 unit PRBCs.  Awaiting insurance  authorization for SNF placement.  No other concerns overnight per nursing staff.  Objective: Vitals:   12/31/18 0959 12/31/18 1030 12/31/18 1255 12/31/18 1445  BP: 113/60 (!) 107/58 123/65 (!) 123/59  Pulse: 79 83 83 87  Resp: 14 16 16  (!) 24  Temp: 99.5 F (37.5 C) 98.9 F (37.2 C) 98.1 F (36.7 C) 98.3 F (36.8 C)  TempSrc: Oral  Oral Oral  SpO2: 100%  94% 95%  Weight:      Height:        Intake/Output Summary (Last 24 hours) at 12/31/2018 1509 Last data filed at 12/31/2018 1413 Gross per 24 hour  Intake 315 ml  Output 1550 ml  Net -1235 ml   Filed Weights   12/28/18 0135  Weight: 49.6 kg    Examination:  General exam: Appears calm and comfortable, pleasantly confused Respiratory system: Clear to auscultation. Respiratory effort normal.  On 2 L nasal cannula Cardiovascular system: S1 & S2 heard, RRR. No JVD, murmurs, rubs, gallops or clicks. No pedal edema. Gastrointestinal system: Abdomen is nondistended, soft and nontender. No organomegaly or masses felt. Normal bowel sounds heard. Central nervous system: Alert, not oriented to person/place/time. No focal neurological deficits. Extremities: Symmetric 5 x 5 power. Skin: No rashes, lesions or ulcers Psychiatry: Judgment and insight poor, mood and affect appropriate    Data Reviewed: I have personally reviewed following labs and imaging studies  CBC: Recent Labs  Lab 12/27/18 2014 12/28/18 0035 12/28/18 1424 12/29/18 0542 12/30/18 0527 12/31/18 0527 12/31/18 1425  WBC 11.7* 12.2*  --  8.3 9.5 8.6  --   NEUTROABS 9.1*  --   --   --   --   --   --   HGB 12.8 11.9* 10.5* 8.8* 8.2* 6.8* 8.2*  HCT 40.3 36.8 31.0* 28.5* 26.0* 22.2* 25.9*  MCV 84.3 83.6  --  86.4 87.5 88.8  --   PLT 213 159  --  127* 111* 113*  --    Basic Metabolic Panel: Recent Labs  Lab 12/28/18 0537 12/28/18 1424 12/28/18 1809 12/29/18 0542 12/30/18 0527 12/31/18 0527  NA 139 137 137 137 136 136  K 2.9* 3.5 3.5 3.6 3.7 3.6  CL  96*  --  98 97* 98 99  CO2 32  --  30 30 30 29   GLUCOSE 119*  --  152* 121* 103* 99  BUN 20  --  16 15 16 12   CREATININE 0.57  --  0.58 0.46 0.33* 0.45  CALCIUM 8.9  --  8.4* 8.2* 8.0* 7.8*  MG 1.8  --   --   --   --   --    GFR: Estimated Creatinine Clearance: 35.9 mL/min (by C-G formula based on SCr of 0.45 mg/dL). Liver Function Tests: Recent Labs  Lab 12/27/18 2014  AST 51*  ALT 12  ALKPHOS 82  BILITOT 1.9*  PROT 6.5  ALBUMIN 3.6   No results for input(s): LIPASE, AMYLASE in the last 168 hours. No results for input(s): AMMONIA in the last 168 hours. Coagulation Profile: Recent Labs  Lab 12/28/18 0035  INR 1.0   Cardiac Enzymes: No results for input(s): CKTOTAL, CKMB, CKMBINDEX, TROPONINI in the last 168 hours. BNP (last 3 results) No results for input(s): PROBNP in the last 8760 hours. HbA1C: No results for input(s): HGBA1C in the last 72 hours. CBG: No results for input(s): GLUCAP in the last 168 hours. Lipid  Profile: No results for input(s): CHOL, HDL, LDLCALC, TRIG, CHOLHDL, LDLDIRECT in the last 72 hours. Thyroid Function Tests: No results for input(s): TSH, T4TOTAL, FREET4, T3FREE, THYROIDAB in the last 72 hours. Anemia Panel: No results for input(s): VITAMINB12, FOLATE, FERRITIN, TIBC, IRON, RETICCTPCT in the last 72 hours. Sepsis Labs: No results for input(s): PROCALCITON, LATICACIDVEN in the last 168 hours.  Recent Results (from the past 240 hour(s))  SARS Coronavirus 2 by RT PCR (hospital order, performed in [facility] hospital lab) Nasopharyngeal Nasopharyngeal Swab     Status: None   Collection Time: 12/27/18  9:29 PM   Specimen: Nasopharyngeal Swab  Result Value Ref Range Status   SARS Coronavirus 2 NEGATIVE NEGATIVE Final    Comment: (NOTE) If result is NEGATIVE SARS-CoV-2 target nucleic acids are NOT DETECTED. The SARS-CoV-2 RNA is generally detectable in upper and lower  respiratory specimens during the acute phase of infection. The lowest   concentration of SARS-CoV-2 viral copies this assay can detect is 250  copies / mL. A negative result does not preclude SARS-CoV-2 infection  and should not be used as the sole basis for treatment or other  patient management decisions.  A negative result may occur with  improper specimen collection / handling, submission of specimen other  than nasopharyngeal swab, presence of viral mutation(s) within the  areas targeted by this assay, and inadequate number of viral copies  (<250 copies / mL). A negative result must be combined with clinical  observations, patient history, and epidemiological information. If result is POSITIVE SARS-CoV-2 target nucleic acids are DETECTED. The SARS-CoV-2 RNA is generally detectable in upper and lower  respiratory specimens dur ing the acute phase of infection.  Positive  results are indicative of active infection with SARS-CoV-2.  Clinical  correlation with patient history and other diagnostic information is  necessary to determine patient infection status.  Positive results do  not rule out bacterial infection or co-infection with other viruses. If result is PRESUMPTIVE POSTIVE SARS-CoV-2 nucleic acids MAY BE PRESENT.   A presumptive positive result was obtained on the submitted specimen  and confirmed on repeat testing.  While 2019 novel coronavirus  (SARS-CoV-2) nucleic acids may be present in the submitted sample  additional confirmatory testing may be necessary for epidemiological  and / or clinical management purposes  to differentiate between  SARS-CoV-2 and other Sarbecovirus currently known to infect humans.  If clinically indicated additional testing with an alternate test  methodology [id number]) is advised. The SARS-CoV-2 RNA is generally  detectable in upper and lower respiratory sp ecimens during the acute  phase of infection. The expected result is Negative. Fact Sheet for Patients:  StrictlyIdeas.no Fact Sheet  for Healthcare Providers: BankingDealers.co.za This test is not yet approved or cleared by the [place] FDA and has been authorized for detection and/or diagnosis of SARS-CoV-2 by FDA under an Emergency Use Authorization (EUA).  This EUA will remain in effect (meaning this test can be used) for the duration of the COVID-19 declaration under Section 564(b)(1) of the Act, 21 U.S.C. section 360bbb-3(b)(1), unless the authorization is terminated or revoked sooner. Performed at [facility], [place] [address], [place], [place] [address]   MRSA PCR Screening     Status: None   Collection Time: 12/28/18 12:02 AM   Specimen: Nasal Mucosa; Nasopharyngeal  Result Value Ref Range Status   MRSA by PCR NEGATIVE NEGATIVE Final    Comment:        The GeneXpert MRSA Assay (  FDA approved for NASAL specimens only), is one component of a comprehensive MRSA colonization surveillance program. It is not intended to diagnose MRSA infection nor to guide or monitor treatment for MRSA infections. Performed at [facility], [place] [address], [place], [place] [address]          Radiology Studies: Dg Chest Port 1 View  Result Date: 12/30/2018 CLINICAL DATA:  Hypoxia EXAM: PORTABLE CHEST 1 VIEW COMPARISON:  Radiograph 12/27/2018, CT 10/22/2017 FINDINGS: Increasing confluent and interstitial opacities in the lung bases. No pneumothorax. No effusion. Mild cardiomegaly similar to prior. Calcified tortuous aorta. Extensive severe degenerative changes present in the shoulders and spine. Dextrocurvature of the upper thoracic spine. Calcification noted in the inferior right shoulder recess. IMPRESSION: Increasing confluent and interstitial opacities in the lung bases, could reflect atelectasis or developing infection. Aortic Atherosclerosis (ICD10-I70.0). Severe degenerative changes of the shoulders and spine. Electronically Signed   By: Lovena Le M.D.    On: 12/30/2018 17:18        Scheduled Meds: . aspirin EC  325 mg Oral Q breakfast  . busPIRone  10 mg Oral TID  . Chlorhexidine Gluconate Cloth  6 each Topical Daily  . docusate sodium  100 mg Oral BID  . famotidine  20 mg Oral QHS  . feeding supplement (ENSURE ENLIVE)  237 mL Oral BID BM  . memantine  28 mg Oral Daily  . metoprolol succinate  25 mg Oral Daily  . multivitamin with minerals  1 tablet Oral Daily  . potassium chloride  10 mEq Oral BID  . sertraline  50 mg Oral Daily  . simvastatin  20 mg Oral q1800   Continuous Infusions: . lactated ringers 50 mL/hr at 12/30/18 1240  . methocarbamol (ROBAXIN) IV       LOS: 4 days    Time spent: 31 minutes spent on chart review, discussion with nursing staff, consultants, updating family and interview/physical exam; more than 50% of that time was spent in counseling and/or coordination of care.    Kalob Bergen J [place] DO [facility] 12/31/2018, 3:09 PM

## 2018-12-31 NOTE — Brief Op Note (Signed)
12/28/2018  8:11 AM  PATIENT:  Jasmine Jones  85 y.o. female  PRE-OPERATIVE DIAGNOSIS:  Right Intertrocanteric fracture  POST-OPERATIVE DIAGNOSIS:  Right Intertrocanteric fracture  PROCEDURE:  Procedure(s): INTRAMEDULLARY (IM) NAIL FEMORAL (Right)  SURGEON:  Surgeon(s) and Role:    Dorna Leitz, MD - Primary  PHYSICIAN ASSISTANT:   ASSISTANTS: jim bethune   ANESTHESIA:   general  EBL:  50 mL   BLOOD ADMINISTERED:none  DRAINS: none   LOCAL MEDICATIONS USED:  MARCAINE     SPECIMEN:  No Specimen  DISPOSITION OF SPECIMEN:  N/A  COUNTS:  YES  TOURNIQUET:  * No tourniquets in log *  DICTATION: .Other Dictation: Dictation Number [id number]  PLAN OF CARE: Admit to inpatient   PATIENT DISPOSITION:  PACU - hemodynamically stable.   Delay start of Pharmacological VTE agent (>24hrs) due to surgical blood loss or risk of bleeding: no

## 2018-12-31 NOTE — Consult Note (Signed)
[facility] Nurse wound consult note Reason for Consult: LLE wound Patient has been resistant to letting staff change or touch the dressing/wrap Wound type: venous stasis  Pressure Injury POA: NA Measurement: (observed image taken by CG in the room and the patient's wound care center appt. Last week) Wound bed: 100% clean and ruddy (view in image clear for assessment)  Drainage (amount, consistency, odor) not assessed  Periwound: intact  Dressing procedure/placement/frequency: Silver hydrofiber cut to fit and placed on the wound, top with foam [facility] is using silver hydrofiber) Orthopedic tech to re-apply Unna's boot. She has a modified compression wrap on now that we do not offer in the acute care setting.   Will need topical care and Unna's boot change 2x Mon/thursday.   Discussed POC with patient and bedside nurse.  Re consult if needed, will not follow at this time. Thanks  Bonnee Zertuche [employer], RN,CWOCN, CNS, [place] [phone])

## 2018-12-31 NOTE — Care Management Important Message (Signed)
Important Message  Patient Details IM Letter given to Sharren Bridge SW to present to the Patient Name: Magan Degenhart MRN: [id number] Date of Birth: September 27, 1925   Medicare Important Message Given:  Yes     Kerin Salen 12/31/2018, 10:20 AM

## 2018-12-31 NOTE — TOC Progression Note (Addendum)
Transition of Care [facility]) - Progression Note    Patient Details  Name: Jasmine Jones MRN: [id number] Date of Birth: [birth year]  [facility]) CM/SW [place], [place] Phone Number: 12/31/2018, 1:16 PM  Clinical Narrative:   Pt offered bed at [facility].  Spoke with niece who states that is her first choice.  Confirmed choice with Levada Dy at [facility].  Initiated authorization through [place] for this [facility] patient.  Addendum:  Authorization received.  Reference [id number]  Good 3 days, Oct 27-29.  Review again on 29th . Send Janett Billow updated clinicals at [phone]    Expected Discharge Plan: [place] Barriers to Discharge: Other (comment)(surgery)  Expected Discharge Plan and Services Expected Discharge Plan: [place] In-house Referral: Clinical Social Work   Post Acute Care Choice: [place] Living arrangements for the past 2 months: [place]                                       Social Determinants of Health (SDOH) Interventions    Readmission Risk Interventions No flowsheet data found.

## 2018-12-31 NOTE — Progress Notes (Signed)
Pt has refused to take any meds this shift, states she does not take meds at night. Writer made several attempts.

## 2019-01-01 LAB — TYPE AND SCREEN
ABO/RH(D): O POS
Antibody Screen: NEGATIVE
Unit division: 0

## 2019-01-01 LAB — BASIC METABOLIC PANEL
Anion gap: 6 (ref 5–15)
BUN: 12 mg/dL (ref 8–23)
CO2: 29 mmol/L (ref 22–32)
Calcium: 8 mg/dL — ABNORMAL LOW (ref 8.9–10.3)
Chloride: 102 mmol/L (ref 98–111)
Creatinine, Ser: 0.45 mg/dL (ref 0.44–1.00)
GFR calc Af Amer: >60 mL/min (ref >60)
GFR calc non Af Amer: >60 mL/min (ref >60)
Glucose, Bld: 97 mg/dL (ref 70–99)
Potassium: 3.4 mmol/L — ABNORMAL LOW (ref 3.5–5.1)
Sodium: 137 mmol/L (ref 135–145)

## 2019-01-01 LAB — CBC
HCT: 24.4 % — ABNORMAL LOW (ref 36.0–46.0)
Hemoglobin: 7.7 g/dL — ABNORMAL LOW (ref 12.0–15.0)
MCH: 27.7 pg (ref 26.0–34.0)
MCHC: 31.6 g/dL (ref 30.0–36.0)
MCV: 87.8 fL (ref 80.0–100.0)
Platelets: 124 10*3/uL — ABNORMAL LOW (ref 150–400)
RBC: 2.78 MIL/uL — ABNORMAL LOW (ref 3.87–5.11)
RDW: 14 % (ref 11.5–15.5)
WBC: 7.9 10*3/uL (ref 4.0–10.5)
nRBC: 0 % (ref 0.0–0.2)

## 2019-01-01 LAB — BPAM RBC
Blood Product Expiration Date: 202011292359
ISSUE DATE / TIME: 202010261009
Unit Type and Rh: 5100

## 2019-01-01 LAB — MAGNESIUM: Magnesium: 2 mg/dL (ref 1.7–2.4)

## 2019-01-01 LAB — SARS CORONAVIRUS 2 (TAT 6-24 HRS): SARS Coronavirus 2: NEGATIVE

## 2019-01-01 MED ORDER — METOPROLOL SUCCINATE ER 25 MG PO TB24: 25.0000 mg | ORAL_TABLET | Freq: Every day | ORAL | 0 refills | Status: DC

## 2019-01-01 MED ORDER — TRAMADOL HCL 50 MG PO TABS: 50.0000 mg | ORAL_TABLET | Freq: Four times a day (QID) | ORAL | 0 refills | Status: DC | PRN

## 2019-01-01 MED ORDER — DOCUSATE SODIUM 100 MG PO CAPS: 100.0000 mg | ORAL_CAPSULE | Freq: Two times a day (BID) | ORAL | 0 refills | Status: AC | PRN

## 2019-01-01 MED ORDER — FAMOTIDINE 20 MG PO TABS: 20.0000 mg | ORAL_TABLET | Freq: Every day | ORAL | 0 refills | Status: DC

## 2019-01-01 MED ORDER — MEMANTINE HCL ER 28 MG PO CP24: 28.0000 mg | ORAL_CAPSULE | Freq: Every day | ORAL | 0 refills | Status: DC

## 2019-01-01 MED ORDER — ALPRAZOLAM 0.25 MG PO TABS: 0.2500 mg | ORAL_TABLET | Freq: Four times a day (QID) | ORAL | 0 refills | Status: DC | PRN

## 2019-01-01 MED ORDER — SERTRALINE HCL 50 MG PO TABS: 50.0000 mg | ORAL_TABLET | Freq: Every day | ORAL | 0 refills | Status: DC

## 2019-01-01 MED ORDER — POTASSIUM CHLORIDE CRYS ER 20 MEQ PO TBCR: 20.0000 meq | EXTENDED_RELEASE_TABLET | Freq: Every day | ORAL | 0 refills | Status: DC

## 2019-01-01 MED ORDER — POLYETHYLENE GLYCOL 3350 17 G PO PACK
17.0000 g | PACK | Freq: Every day | ORAL | Status: DC
  Administered 2019-01-01: 17 g via ORAL
  Filled 2019-01-01: qty 1

## 2019-01-01 MED ORDER — POLYETHYLENE GLYCOL 3350 17 G PO PACK: 17.0000 g | PACK | Freq: Every day | ORAL | 0 refills | Status: AC

## 2019-01-01 MED ORDER — SIMVASTATIN 20 MG PO TABS: 20.0000 mg | ORAL_TABLET | Freq: Every day | ORAL | 0 refills | Status: DC

## 2019-01-01 MED ORDER — ASPIRIN EC 325 MG PO TBEC: 325.0000 mg | DELAYED_RELEASE_TABLET | Freq: Every day | ORAL | 0 refills | Status: DC

## 2019-01-01 MED ORDER — BUSPIRONE HCL 10 MG PO TABS: 10.0000 mg | ORAL_TABLET | Freq: Three times a day (TID) | ORAL | 0 refills | Status: AC

## 2019-01-01 NOTE — TOC Transition Note (Signed)
Transition of Care [facility]) - CM/SW Discharge Note   Patient Details  Name: Samone Disandro MRN: [id number] Date of Birth: [birth year]  Transition of Care (TOC) CM/SW Contact:  Joaquin Courts, RN Phone Number: 01/01/2019, 1:01 PM   Clinical Narrative:    Cm confirmed bed availability at [facility]. Patient to dc today to room 402.  PTAR transportation arranged, next of kin (niece-Sue) notified.    Final next level of care: Skilled Nursing Facility Barriers to Discharge: No Barriers Identified   Patient Goals and CMS Choice   CMS Medicare.gov Compare Post Acute Care list provided to:: Patient Represenative (must comment)(neice) Choice offered to / list presented to : (family)  Discharge Placement   Existing PASRR number confirmed : 12/28/18          Patient chooses bed at: [facility], [place]        Discharge Plan and Services In-house Referral: Clinical Social Work   Post Acute Care Choice: [place]                               Social Determinants of Health (SDOH) Interventions     Readmission Risk Interventions No flowsheet data found.

## 2019-01-01 NOTE — Discharge Summary (Addendum)
Physician Discharge Summary  Jasmine Jones [id number] DOB: 23-Feb-1926 DOA: 12/27/2018  PCP: [name], NP  Admit date: 12/27/2018 Discharge date: 01/01/2019  Admitted From: SNF Disposition:  [facility]  Recommendations for Outpatient Follow-up:  1. Follow up with PCP in 1 week 2. Follow-up with orthopedics, Dr. Berenice Primas in 2 weeks 3. Please obtain BMP/CBC in one week 4. Will need voiding trial once more mobile secondary to urinary retention while inpatient  Home Health: No Equipment/Devices: Foley catheter  Discharge Condition: Stable CODE STATUS: DNR Diet recommendation: Heart Healthy   History of present illness:  Jasmine Churchwellis a 85 y.o.femalewith medical history significant forhypertension, hyperlipidemia, dementia, depression, and anxiety, now presenting to the emergency department with right hip pain after an unwitnessed fall. Patient is unable to contribute to the history due to her dementia. At her baseline, she is reportedly confused, ambulates with a walker, and is able to feed herself. She was complaining of right hip pain and brought into the ED for evaluation. She received 100 mcg of fentanyl prior to arrival.  ED Course:Upon arrival to the ED, patient is found to be afebrile, saturating mid 90s on room air, and with stable blood pressure. EKG features a sinus rhythm, PACs, RBBB, LAFB, and is similar to prior. Chest x-ray is negative for acute cardiopulmonary disease. Noncontrast head CT is negative for acute intracranial abnormality.There is no fracture on cervical spine CT. Plain films of the right hip demonstrate comminuted impacted fracture of the proximal right femoral shaft. Chemistry panel features and elevated BUN to creatinine ratio, slight elevation in AST, and total bilirubin 1.9. CBC is notable for mild leukocytosis. Patient was treated with Dilaudid and morphine in the ED and orthopedic surgery was consulted  by the ED physician.  Hospital course:  Right closed comminuted/impacted hip fracture:  Right hip x-ray with comminuted impacted fracture of the proximal femoral shaft.  Underwent ORIF with IM nail by Dr. Berenice Primas on 12/28/2018. Aspirin 325 mg p.o. daily for DVT prophylaxis per orthopedics x one month. Pain control with with tramadol 50 mg every 6 hours as needed.  Bowel regimen with Colace and MiraLAX.  Needs follow-up with orthopedics 2 weeks following hospitalization with Dr. [name] for follow-up check.  Urinary retention Patient developed urinary tension during hospitalization likely secondary to poor mobility.  Initially required in and out catheterization but with repeated episodes, Foley catheter was placed on 12/30/2018.  Will remain in place following discharge and recommend voiding trial once patient more mobile.  Essential hypertension:  Blood pressure 116/54 this morning, well controlled. Continue metoprolol succinate 25 mg p.o. daily  Hypoxemia:  Chest x-ray with confluent/interstitial opacities bilateral lung base consistent with atelectasis versus infection.  Patient is afebrile without leukocytosis.  Etiology likely postoperative atelectasis.  Patient titrated off of supplemental oxygen.  Oxygenating 95% on room air at time of discharge.  Continue to encourage incentive spirometry while awake.  Hypokalemia:Resolved. Repleted during hospitalization.  Postoperative blood loss anemia:  Post operative drop of hemoglobin from 10.5 to 6.8 on 10/26.  Estimated blood loss during surgery 150 mL, but noted mostly hematoma from likely underlying fracture.  Patient was transfused 1 unit PRBC on 12/31/2018 with appropriate rise in hemoglobin to 7.7 at time of discharge.  Recommend repeat CBC in 1 week.  Dementia:  At baseline, patient is reportedly confused, ambulates with a walker, feeds self. Continue Namendaas at home.  Depression, anxiety:  Continue Zoloft, Buspar, and as-needed  Xanax.  Chronic leg wound:  Continue wound  care  Hard of hearing: Supportive care, visual cues  Discharge Diagnoses:  Principal Problem:   Closed right hip fracture, initial encounter [facility]) Active Problems:   Essential hypertension   Depression with anxiety   Dementia [facility])    Discharge Instructions  Discharge Instructions    Diet - low sodium heart healthy   Complete by: As directed    Increase activity slowly   Complete by: As directed    Touch down weight bearing   Complete by: As directed    Laterality: right   Extremity: Lower     Allergies as of 01/01/2019   No Known Allergies     Medication List    STOP taking these medications   diclofenac sodium 1 % Gel Commonly known as: VOLTAREN   hydrochlorothiazide 25 MG tablet Commonly known as: HYDRODIURIL   loperamide 2 MG capsule Commonly known as: IMODIUM   Systane 0.4-0.3 % Gel ophthalmic gel Generic drug: Polyethyl Glycol-Propyl Glycol   torsemide 20 MG tablet Commonly known as: Demadex     TAKE these medications   acetaminophen 325 MG tablet Commonly known as: TYLENOL To take tylenol 650 mg midday, can take every 6 hours as needed pain max 3000 mg in 24/hours   ALPRAZolam 0.25 MG tablet Commonly known as: XANAX Take 1 tablet (0.25 mg total) by mouth every 6 (six) hours as needed for anxiety.   aspirin EC 325 MG tablet Take 1 tablet (325 mg total) by mouth daily after breakfast. Take x 1 month post op to decrease risk of blood clots.   busPIRone 10 MG tablet Commonly known as: BUSPAR Take 1 tablet (10 mg total) by mouth 3 (three) times daily.   calcium carbonate 600 MG Tabs tablet Commonly known as: OS-CAL Take 600 mg by mouth every 12 (twelve) hours.   cholecalciferol 25 MCG (1000 UT) tablet Commonly known as: VITAMIN D Take 2,000 Units by mouth daily.   Daily Vite Tabs Take 1 tablet by mouth daily.   docusate sodium 100 MG capsule Commonly known as: COLACE Take 1 capsule (100  mg total) by mouth 2 (two) times daily as needed for mild constipation. What changed: when to take this   Ensure Take 237 mLs by mouth daily.   EQ VISION FORMULA 50+ PO Take 1 tablet by mouth every 12 (twelve) hours.   famotidine 20 MG tablet Commonly known as: PEPCID Take 1 tablet (20 mg total) by mouth at bedtime.   memantine 28 MG Cp24 24 hr capsule Commonly known as: Namenda XR Take 1 capsule (28 mg total) by mouth daily. Start taking on: January 02, 2019   metoprolol succinate 25 MG 24 hr tablet Commonly known as: TOPROL-XL Take 1 tablet (25 mg total) by mouth daily. Start taking on: January 02, 2019   polyethylene glycol 17 g packet Commonly known as: MIRALAX / GLYCOLAX Take 17 g by mouth daily.   potassium chloride SA 20 MEQ tablet Commonly known as: KLOR-CON Take 1 tablet (20 mEq total) by mouth daily. What changed:   medication strength  how much to take   sertraline 50 MG tablet Commonly known as: ZOLOFT Take 1 tablet (50 mg total) by mouth daily. Start taking on: January 02, 2019   simvastatin 20 MG tablet Commonly known as: ZOCOR Take 1 tablet (20 mg total) by mouth daily at 6 PM. What changed: when to take this   traMADol 50 MG tablet Commonly known as: ULTRAM Take 1 tablet (50 mg total) by mouth  every 6 (six) hours as needed for moderate pain. What changed:   when to take this  reasons to take this            Discharge Care Instructions  (From admission, onward)         Start     Ordered   12/28/18 0000  Touch down weight bearing    Question Answer Comment  Laterality right   Extremity Lower      12/28/18 1650          Contact information for follow-up providers    Dorna Leitz, MD. Schedule an appointment as soon as possible for a visit in 2 weeks.   Specialty: Orthopedic Surgery Contact information: [place] [id number] [phone]            Contact information for after-discharge care     Destination    [facility] Preferred SNF .   Service: Skilled Nursing Contact information: [place] [phone]                 No Known Allergies  Consultations:  [facility], Dr. Berenice Primas   Procedures/Studies: Dg Chest 1 View  Result Date: 12/27/2018 CLINICAL DATA:  85 year old female with history of fall.  Hip pain. EXAM: CHEST  1 VIEW COMPARISON:  Chest x-ray 11/13/2016. FINDINGS: Lung volumes are normal. No consolidative airspace disease. No pleural effusions. No pneumothorax. No pulmonary nodule or mass noted. Pulmonary vasculature and the cardiomediastinal silhouette are within normal limits. Atherosclerotic calcifications in the thoracic aorta. IMPRESSION: 1.  No radiographic evidence of acute cardiopulmonary disease. 2. Aortic atherosclerosis. Electronically Signed   By: Vinnie Langton M.D.   On: 12/27/2018 21:00   Dg Pelvis 1-2 Views  Result Date: 12/27/2018 CLINICAL DATA:  Fall EXAM: PELVIS - 1-2 VIEW COMPARISON:  None. FINDINGS: There is a comminuted impacted fracture of the right proximal femur involving the greater and lesser trochanters. The femoral head still articulates with the acetabulum. No other definite fracture seen. IMPRESSION: Comminuted impacted proximal right femoral fracture involving the greater and lesser trochanters. Electronically Signed   By: Prudencio Pair M.D.   On: 12/27/2018 20:59   Ct Head Wo Contrast  Result Date: 12/27/2018 CLINICAL DATA:  Head trauma.  Fall EXAM: CT HEAD WITHOUT CONTRAST CT CERVICAL SPINE WITHOUT CONTRAST TECHNIQUE: Multidetector CT imaging of the head and cervical spine was performed following the standard protocol without intravenous contrast. Multiplanar CT image reconstructions of the cervical spine were also generated. COMPARISON:  CT head 09/17/2018 FINDINGS: CT HEAD FINDINGS Brain: Moderate atrophy. Chronic ischemic changes in the white matter.  Chronic infarct left frontal lobe unchanged. Negative for acute infarct.  Negative for acute hemorrhage or mass Vascular: Negative for hyperdense vessel Skull:   Negative for fracture Sinuses/Orbits: Bubbly secretions in the right sphenoid sinus otherwise clear sinuses. Bilateral cataract surgery. Other: None CT CERVICAL SPINE FINDINGS Alignment: Mild retrolisthesis C3-4. Mild anterolisthesis C7-T1, T1-T2, T2-3 Skull base and vertebrae: Negative for fracture or mass Soft tissues and spinal canal: 14 mm right thyroid nodule. No adenopathy. Disc levels: Multilevel disc and facet degeneration throughout the cervical spine. No significant spinal stenosis. Upper chest: Irregular soft tissue density with associated calcification left upper lobe anteriorly. This was present on prior chest CT October 22, 2017. Probable scarring Other: None IMPRESSION: Atrophy and chronic ischemic changes. No acute intracranial abnormality Negative for cervical spine fracture.  Cervical spondylosis 14 mm right thyroid nodule. No further  imaging is indicated based on size of nodule and patient age. Irregular soft tissue density with calcification left upper lobe anteriorly, probable scarring. Electronically Signed   By: Franchot Gallo M.D.   On: 12/27/2018 20:40   Ct Cervical Spine Wo Contrast  Result Date: 12/27/2018 CLINICAL DATA:  Head trauma.  Fall EXAM: CT HEAD WITHOUT CONTRAST CT CERVICAL SPINE WITHOUT CONTRAST TECHNIQUE: Multidetector CT imaging of the head and cervical spine was performed following the standard protocol without intravenous contrast. Multiplanar CT image reconstructions of the cervical spine were also generated. COMPARISON:  CT head 09/17/2018 FINDINGS: CT HEAD FINDINGS Brain: Moderate atrophy. Chronic ischemic changes in the white matter. Chronic infarct left frontal lobe unchanged. Negative for acute infarct.  Negative for acute hemorrhage or mass Vascular: Negative for hyperdense vessel Skull:   Negative for  fracture Sinuses/Orbits: Bubbly secretions in the right sphenoid sinus otherwise clear sinuses. Bilateral cataract surgery. Other: None CT CERVICAL SPINE FINDINGS Alignment: Mild retrolisthesis C3-4. Mild anterolisthesis C7-T1, T1-T2, T2-3 Skull base and vertebrae: Negative for fracture or mass Soft tissues and spinal canal: 14 mm right thyroid nodule. No adenopathy. Disc levels: Multilevel disc and facet degeneration throughout the cervical spine. No significant spinal stenosis. Upper chest: Irregular soft tissue density with associated calcification left upper lobe anteriorly. This was present on prior chest CT October 22, 2017. Probable scarring Other: None IMPRESSION: Atrophy and chronic ischemic changes. No acute intracranial abnormality Negative for cervical spine fracture.  Cervical spondylosis 14 mm right thyroid nodule. No further imaging is indicated based on size of nodule and patient age. Irregular soft tissue density with calcification left upper lobe anteriorly, probable scarring. Electronically Signed   By: Franchot Gallo M.D.   On: 12/27/2018 20:40   Dg Chest Port 1 View  Result Date: 12/30/2018 CLINICAL DATA:  Hypoxia EXAM: PORTABLE CHEST 1 VIEW COMPARISON:  Radiograph 12/27/2018, CT 10/22/2017 FINDINGS: Increasing confluent and interstitial opacities in the lung bases. No pneumothorax. No effusion. Mild cardiomegaly similar to prior. Calcified tortuous aorta. Extensive severe degenerative changes present in the shoulders and spine. Dextrocurvature of the upper thoracic spine. Calcification noted in the inferior right shoulder recess. IMPRESSION: Increasing confluent and interstitial opacities in the lung bases, could reflect atelectasis or developing infection. Aortic Atherosclerosis (ICD10-I70.0). Severe degenerative changes of the shoulders and spine. Electronically Signed   By: Lovena Le M.D.   On: 12/30/2018 17:18   Dg Knee Complete 4 Views Right  Result Date: 12/27/2018 CLINICAL  DATA:  Fall EXAM: RIGHT KNEE - COMPLETE 4+ VIEW COMPARISON:  None. FINDINGS: No definite fracture or dislocation. Tricompartmental osteoarthritis is seen most notable in the medial compartment with joint space loss and marginal osteophyte formation. No large knee joint effusion. Dense vascular calcifications are noted. IMPRESSION: No acute osseous abnormality. Electronically Signed   By: Prudencio Pair M.D.   On: 12/27/2018 21:00   Dg C-arm 1-60 Min-no Report  Result Date: 12/28/2018 CLINICAL DATA:  Portable imaging for ORIF a right proximal femur fracture EXAM: RIGHT FEMUR 2 VIEWS; DG C-ARM 1-60 MIN-NO REPORT COMPARISON:  None. FINDINGS: Four submitted images show placement of an intramedullary rod, which extends from the greater trochanter to the distal femoral metadiaphysis, supporting a compression screw, reducing the primary intertrochanteric fracture components into near anatomic alignment. The orthopedic hardware is well-seated. IMPRESSION: 1. Well aligned proximal right femur fracture following ORIF. Electronically Signed   By: Lajean Manes M.D.   On: 12/28/2018 17:24   Dg Femur, Min 2 Views Right  Result  Date: 12/28/2018 CLINICAL DATA:  Portable imaging for ORIF a right proximal femur fracture EXAM: RIGHT FEMUR 2 VIEWS; DG C-ARM 1-60 MIN-NO REPORT COMPARISON:  None. FINDINGS: Four submitted images show placement of an intramedullary rod, which extends from the greater trochanter to the distal femoral metadiaphysis, supporting a compression screw, reducing the primary intertrochanteric fracture components into near anatomic alignment. The orthopedic hardware is well-seated. IMPRESSION: 1. Well aligned proximal right femur fracture following ORIF. Electronically Signed   By: Lajean Manes M.D.   On: 12/28/2018 17:24   Dg Femur Min 2 Views Right  Result Date: 12/27/2018 CLINICAL DATA:  Fall EXAM: RIGHT FEMUR 2 VIEWS COMPARISON:  None. FINDINGS: Comminuted impacted fracture of the proximal right  femoral shaft involving the greater and lesser trochanters. Femoral head is still seated in the acetabulum. A healed fracture deformity of the fibula. 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 Dense vascular calcifications are noted. There is diffuse osteopenia. IMPRESSION: Comminuted impacted fracture of the proximal femoral shaft above the greater lesser trochanter. Electronically Signed   By: Prudencio Pair M.D.   On: 12/27/2018 21:01     Subjective: Patient seen and examined bedside, resting comfortably.  Very difficult in terms of communication given her issues with hearing.  Pleasantly confused.  Requesting MiraLAX.  No other complaints.  No other acute concerns overnight per nursing staff.  Discharging to SNF today.  Discharge Exam: Vitals:   12/31/18 2159 01/01/19 0610  BP: 128/71 116/68  Pulse: 84 81  Resp: 16 20  Temp: 98.2 F (36.8 C) 97.9 F (36.6 C)  SpO2: 94% 95%   Vitals:   12/31/18 1255 12/31/18 1445 12/31/18 2159 01/01/19 0610  BP: 123/65 (!) 123/59 128/71 116/68  Pulse: 83 87 84 81  Resp: 16 (!) 24 16 20   Temp: 98.1 F (36.7 C) 98.3 F (36.8 C) 98.2 F (36.8 C) 97.9 F (36.6 C)  TempSrc: Oral Oral    SpO2: 94% 95% 94% 95%  Weight:      Height:        General exam: Appears calm and comfortable, pleasantly confused Respiratory system: Clear to auscultation. Respiratory effort normal.  On room air Cardiovascular system: S1 & S2 heard, RRR. No JVD, murmurs, rubs, gallops or clicks. No pedal edema. Gastrointestinal system: Abdomen is nondistended, soft and nontender. No organomegaly or masses felt. Normal bowel sounds heard. Central nervous system: Alert, not oriented to person/place/time. No focal neurological deficits. Extremities: Moves all extremities independently, surgical site with dressings in place, clean/dry/intact Skin: No rashes, lesions or ulcers Psychiatry: Judgment and insight poor, mood and affect appropriate    The results of  significant diagnostics from this hospitalization (including imaging, microbiology, ancillary and laboratory) are listed below for reference.     Microbiology: Recent Results (from the past 240 hour(s))  SARS Coronavirus 2 by RT PCR (hospital order, performed in [facility] hospital lab) Nasopharyngeal Nasopharyngeal Swab     Status: None   Collection Time: 12/27/18  9:29 PM   Specimen: Nasopharyngeal Swab  Result Value Ref Range Status   SARS Coronavirus 2 NEGATIVE NEGATIVE Final    Comment: (NOTE) If result is NEGATIVE SARS-CoV-2 target nucleic acids are NOT DETECTED. The SARS-CoV-2 RNA is generally detectable in upper and lower  respiratory specimens during the acute phase of infection. The lowest  concentration of SARS-CoV-2 viral copies this assay can detect is 250  copies / mL. A negative result does not preclude SARS-CoV-2 infection  and should not be used as  the sole basis for treatment or other  patient management decisions.  A negative result may occur with  improper specimen collection / handling, submission of specimen other  than nasopharyngeal swab, presence of viral mutation(s) within the  areas targeted by this assay, and inadequate number of viral copies  (<250 copies / mL). A negative result must be combined with clinical  observations, patient history, and epidemiological information. If result is POSITIVE SARS-CoV-2 target nucleic acids are DETECTED. The SARS-CoV-2 RNA is generally detectable in upper and lower  respiratory specimens dur ing the acute phase of infection.  Positive  results are indicative of active infection with SARS-CoV-2.  Clinical  correlation with patient history and other diagnostic information is  necessary to determine patient infection status.  Positive results do  not rule out bacterial infection or co-infection with other viruses. If result is PRESUMPTIVE POSTIVE SARS-CoV-2 nucleic acids MAY BE PRESENT.   A presumptive positive result  was obtained on the submitted specimen  and confirmed on repeat testing.  While 2019 novel coronavirus  (SARS-CoV-2) nucleic acids may be present in the submitted sample  additional confirmatory testing may be necessary for epidemiological  and / or clinical management purposes  to differentiate between  SARS-CoV-2 and other Sarbecovirus currently known to infect humans.  If clinically indicated additional testing with an alternate test  methodology [phone]) is advised. The SARS-CoV-2 RNA is generally  detectable in upper and lower respiratory sp ecimens during the acute  phase of infection. The expected result is Negative. Fact Sheet for Patients:  StrictlyIdeas.no Fact Sheet for Healthcare Providers: BankingDealers.co.za This test is not yet approved or cleared by the [place] FDA and has been authorized for detection and/or diagnosis of SARS-CoV-2 by FDA under an Emergency Use Authorization (EUA).  This EUA will remain in effect (meaning this test can be used) for the duration of the COVID-19 declaration under Section 564(b)(1) of the Act, 21 U.S.C. section 360bbb-3(b)(1), unless the authorization is terminated or revoked sooner. Performed at [facility], [place] [address] [place], [place] [address]   MRSA PCR Screening     Status: None   Collection Time: 12/28/18 12:02 AM   Specimen: Nasal Mucosa; Nasopharyngeal  Result Value Ref Range Status   MRSA by PCR NEGATIVE NEGATIVE Final    Comment:        The GeneXpert MRSA Assay (FDA approved for NASAL specimens only), is one component of a comprehensive MRSA colonization surveillance program. It is not intended to diagnose MRSA infection nor to guide or monitor treatment for MRSA infections. Performed at [facility], [place] [address], [place], [place] [address]   SARS CORONAVIRUS 2 (TAT 6-24 HRS) Nasopharyngeal Nasopharyngeal Swab      Status: None   Collection Time: 12/31/18  6:01 PM   Specimen: Nasopharyngeal Swab  Result Value Ref Range Status   SARS Coronavirus 2 NEGATIVE NEGATIVE Final    Comment: (NOTE) SARS-CoV-2 target nucleic acids are NOT DETECTED. The SARS-CoV-2 RNA is generally detectable in upper and lower respiratory specimens during the acute phase of infection. Negative results do not preclude SARS-CoV-2 infection, do not rule out co-infections with other pathogens, and should not be used as the sole basis for treatment or other patient management decisions. Negative results must be combined with clinical observations, patient history, and epidemiological information. The expected result is Negative. Fact Sheet for Patients: SugarRoll.be Fact Sheet for Healthcare Providers: https://www.woods-mathews.com/ This test is not yet approved or cleared by the  [place] Architectural technologist and  has been authorized for detection and/or diagnosis of SARS-CoV-2 by FDA under an Print production planner (EUA). This EUA will remain  in effect (meaning this test can be used) for the duration of the COVID-19 declaration under Section 56 4(b)(1) of the Act, 21 U.S.C. section 360bbb-3(b)(1), unless the authorization is terminated or revoked sooner. Performed at [facility], [place] [address] [place], [place] [address]      Labs: BNP (last 3 results) No results for input(s): BNP in the last 8760 hours. Basic Metabolic Panel: Recent Labs  Lab 12/28/18 0537  12/28/18 1809 12/29/18 0542 12/30/18 0527 12/31/18 0527 01/01/19 0602  NA 139   < > 137 137 136 136 137  K 2.9*   < > 3.5 3.6 3.7 3.6 3.4*  CL 96*  --  98 97* 98 99 102  CO2 32  --  30 30 30 29 29   GLUCOSE 119*  --  152* 121* 103* 99 97  BUN 20  --  16 15 16 12 12   CREATININE 0.57  --  0.58 0.46 0.33* 0.45 0.45  CALCIUM 8.9  --  8.4* 8.2* 8.0* 7.8* 8.0*  MG 1.8  --   --   --   --   --  2.0   < > = values in this  interval not displayed.   Liver Function Tests: Recent Labs  Lab 12/27/18 2014  AST 51*  ALT 12  ALKPHOS 82  BILITOT 1.9*  PROT 6.5  ALBUMIN 3.6   No results for input(s): LIPASE, AMYLASE in the last 168 hours. No results for input(s): AMMONIA in the last 168 hours. CBC: Recent Labs  Lab 12/27/18 2014 12/28/18 0035  12/29/18 0542 12/30/18 0527 12/31/18 0527 12/31/18 1425 01/01/19 0602  WBC 11.7* 12.2*  --  8.3 9.5 8.6  --  7.9  NEUTROABS 9.1*  --   --   --   --   --   --   --   HGB 12.8 11.9*   < > 8.8* 8.2* 6.8* 8.2* 7.7*  HCT 40.3 36.8   < > 28.5* 26.0* 22.2* 25.9* 24.4*  MCV 84.3 83.6  --  86.4 87.5 88.8  --  87.8  PLT 213 159  --  127* 111* 113*  --  124*   < > = values in this interval not displayed.   Cardiac Enzymes: No results for input(s): CKTOTAL, CKMB, CKMBINDEX, TROPONINI in the last 168 hours. BNP: Invalid input(s): POCBNP CBG: No results for input(s): GLUCAP in the last 168 hours. D-Dimer No results for input(s): DDIMER in the last 72 hours. Hgb A1c No results for input(s): HGBA1C in the last 72 hours. Lipid Profile No results for input(s): CHOL, HDL, LDLCALC, TRIG, CHOLHDL, LDLDIRECT in the last 72 hours. Thyroid function studies No results for input(s): TSH, T4TOTAL, T3FREE, THYROIDAB in the last 72 hours.  Invalid input(s): FREET3 Anemia work up No results for input(s): VITAMINB12, FOLATE, FERRITIN, TIBC, IRON, RETICCTPCT in the last 72 hours. Urinalysis    Component Value Date/Time   COLORURINE STRAW (A) 09/16/2018 2340   APPEARANCEUR CLEAR 09/16/2018 2340   LABSPEC 1.006 09/16/2018 2340   PHURINE 7.0 09/16/2018 2340   GLUCOSEU NEGATIVE 09/16/2018 2340   HGBUR NEGATIVE 09/16/2018 2340   BILIRUBINUR NEGATIVE 09/16/2018 2340   KETONESUR NEGATIVE 09/16/2018 2340   PROTEINUR NEGATIVE 09/16/2018 2340   NITRITE NEGATIVE 09/16/2018 2340   LEUKOCYTESUR NEGATIVE 09/16/2018 2340   Sepsis Labs Invalid input(s):  PROCALCITONIN,  WBC,   LACTICIDVEN Microbiology Recent Results (from the past 240 hour(s))  SARS Coronavirus 2 by RT PCR (hospital order, performed in [facility] hospital lab) Nasopharyngeal Nasopharyngeal Swab     Status: None   Collection Time: 12/27/18  9:29 PM   Specimen: Nasopharyngeal Swab  Result Value Ref Range Status   SARS Coronavirus 2 NEGATIVE NEGATIVE Final    Comment: (NOTE) If result is NEGATIVE SARS-CoV-2 target nucleic acids are NOT DETECTED. The SARS-CoV-2 RNA is generally detectable in upper and lower  respiratory specimens during the acute phase of infection. The lowest  concentration of SARS-CoV-2 viral copies this assay can detect is 250  copies / mL. A negative result does not preclude SARS-CoV-2 infection  and should not be used as the sole basis for treatment or other  patient management decisions.  A negative result may occur with  improper specimen collection / handling, submission of specimen other  than nasopharyngeal swab, presence of viral mutation(s) within the  areas targeted by this assay, and inadequate number of viral copies  (<250 copies / mL). A negative result must be combined with clinical  observations, patient history, and epidemiological information. If result is POSITIVE SARS-CoV-2 target nucleic acids are DETECTED. The SARS-CoV-2 RNA is generally detectable in upper and lower  respiratory specimens dur ing the acute phase of infection.  Positive  results are indicative of active infection with SARS-CoV-2.  Clinical  correlation with patient history and other diagnostic information is  necessary to determine patient infection status.  Positive results do  not rule out bacterial infection or co-infection with other viruses. If result is PRESUMPTIVE POSTIVE SARS-CoV-2 nucleic acids MAY BE PRESENT.   A presumptive positive result was obtained on the submitted specimen  and confirmed on repeat testing.  While 2019 novel coronavirus  (SARS-CoV-2) nucleic acids may  be present in the submitted sample  additional confirmatory testing may be necessary for epidemiological  and / or clinical management purposes  to differentiate between  SARS-CoV-2 and other Sarbecovirus currently known to infect humans.  If clinically indicated additional testing with an alternate test  methodology [phone]) is advised. The SARS-CoV-2 RNA is generally  detectable in upper and lower respiratory sp ecimens during the acute  phase of infection. The expected result is Negative. Fact Sheet for Patients:  StrictlyIdeas.no Fact Sheet for Healthcare Providers: BankingDealers.co.za This test is not yet approved or cleared by the [place] FDA and has been authorized for detection and/or diagnosis of SARS-CoV-2 by FDA under an Emergency Use Authorization (EUA).  This EUA will remain in effect (meaning this test can be used) for the duration of the COVID-19 declaration under Section 564(b)(1) of the Act, 21 U.S.C. section 360bbb-3(b)(1), unless the authorization is terminated or revoked sooner. Performed at [facility], [place] [address] [place], [place] [address]   MRSA PCR Screening     Status: None   Collection Time: 12/28/18 12:02 AM   Specimen: Nasal Mucosa; Nasopharyngeal  Result Value Ref Range Status   MRSA by PCR NEGATIVE NEGATIVE Final    Comment:        The GeneXpert MRSA Assay (FDA approved for NASAL specimens only), is one component of a comprehensive MRSA colonization surveillance program. It is not intended to diagnose MRSA infection nor to guide or monitor treatment for MRSA infections. Performed at [facility], [place] [address], [place], [place] [address]   SARS CORONAVIRUS 2 (TAT 6-24 HRS) Nasopharyngeal Nasopharyngeal Swab  Status: None   Collection Time: 12/31/18  6:01 PM   Specimen: Nasopharyngeal Swab  Result Value Ref Range Status   SARS Coronavirus 2  NEGATIVE NEGATIVE Final    Comment: (NOTE) SARS-CoV-2 target nucleic acids are NOT DETECTED. The SARS-CoV-2 RNA is generally detectable in upper and lower respiratory specimens during the acute phase of infection. Negative results do not preclude SARS-CoV-2 infection, do not rule out co-infections with other pathogens, and should not be used as the sole basis for treatment or other patient management decisions. Negative results must be combined with clinical observations, patient history, and epidemiological information. The expected result is Negative. Fact Sheet for Patients: SugarRoll.be Fact Sheet for Healthcare Providers: https://www.woods-mathews.com/ This test is not yet approved or cleared by the [place] FDA and  has been authorized for detection and/or diagnosis of SARS-CoV-2 by FDA under an Emergency Use Authorization (EUA). This EUA will remain  in effect (meaning this test can be used) for the duration of the COVID-19 declaration under Section 56 4(b)(1) of the Act, 21 U.S.C. section 360bbb-3(b)(1), unless the authorization is terminated or revoked sooner. Performed at [facility], [place] [address] [place], [place] [address]      Time coordinating discharge: Over 30 minutes  SIGNED:   Glennys Schorsch J [place] DO  [facility] 01/01/2019, 11:16 AM

## 2019-01-01 NOTE — Progress Notes (Signed)
Report given to nurse at [facility]. Patient is being transferred via [facility]. No questions or concerns at this time.

## 2019-02-27 ENCOUNTER — Ambulatory Visit: Payer: Medicare Other | Admitting: Nurse Practitioner

## 2019-03-25 ENCOUNTER — Other Ambulatory Visit: Payer: Self-pay

## 2019-03-25 ENCOUNTER — Telehealth: Payer: Self-pay

## 2019-03-25 ENCOUNTER — Encounter: Payer: Medicare Other | Admitting: Nurse Practitioner

## 2019-03-25 ENCOUNTER — Encounter: Payer: Self-pay | Admitting: Nurse Practitioner

## 2019-03-25 NOTE — Telephone Encounter (Signed)
Spoke with [name], patients niece and she would like to know how to proceed with patient getting her aunts second covid 43 vaccine.  1st vaccine received at [facility] facility 03/03/2019. Patient was to have second dose on 04/01/2019.   Patient has transferred back to [place] and the earliest they can administer second dose is 04/12/2019. [name] question if it is ok to get off schedule   Please advise

## 2019-03-25 NOTE — Telephone Encounter (Signed)
Another thing to think about is the manufacturer-- needs to be the same for both vaccines. I would recommend calling   The [place] number and ask them if they can help---  [phone] or call the Health Department at [phone] and she what they recommend.

## 2019-03-25 NOTE — Telephone Encounter (Signed)
Discussed response with [name]. [name] verbalized understanding of response. [name] plans to call both numbers to get updated information on how to proceed with Covid19 vaccine.    I also advised [name] that we did not complete her Aunts video visit yet for the facility [facility]) said they were not prepared or informed about a visit with patient today. The facility did not have a staff member readily available at the time of call. I was told by [facility] at the facility that someone would call me back and I am still awaiting call. Per Janett Billow more than likely visit will need to be rescheduled, yet we will determine once call returned.  [name] mentioned that she did not correlate with with facility for she got a call from [place] at 4:30 pm on Friday and did not have time.

## 2019-03-25 NOTE — Progress Notes (Signed)
   This service is provided via telemedicine  No vital signs collected/recorded due to the encounter was a telemedicine visit.   Location of patient (ex: home, work):  Home  Patient consents to a telephone visit:  Yes  Location of the provider (ex: office, home):  [facility], Office   Name of any referring provider: N/A  Names of all persons participating in the telemedicine service and their role in the encounter:  S.Chrae B/CMA, Jessica Eubanks, NP, and Patient   Time spent on call: 10 min with medical assistant   

## 2019-03-27 ENCOUNTER — Encounter: Payer: Self-pay | Admitting: Nurse Practitioner

## 2019-03-27 ENCOUNTER — Telehealth: Payer: Self-pay | Admitting: *Deleted

## 2019-03-27 NOTE — Telephone Encounter (Signed)
Melissa with [facility] called requesting verbal orders for OT 2x3, 1x3 for self care, strength and balance.  Verbal orders given.

## 2019-03-29 ENCOUNTER — Encounter: Payer: Self-pay | Admitting: Nurse Practitioner

## 2019-03-29 ENCOUNTER — Telehealth (INDEPENDENT_AMBULATORY_CARE_PROVIDER_SITE_OTHER): Payer: Medicare Other | Admitting: Nurse Practitioner

## 2019-03-29 ENCOUNTER — Telehealth: Payer: Self-pay

## 2019-03-29 ENCOUNTER — Other Ambulatory Visit: Payer: Self-pay

## 2019-03-29 DIAGNOSIS — I10 Essential (primary) hypertension: Secondary | ICD-10-CM

## 2019-03-29 DIAGNOSIS — I480 Paroxysmal atrial fibrillation: Secondary | ICD-10-CM

## 2019-03-29 DIAGNOSIS — E2839 Other primary ovarian failure: Secondary | ICD-10-CM | POA: Diagnosis not present

## 2019-03-29 DIAGNOSIS — F0391 Unspecified dementia with behavioral disturbance: Secondary | ICD-10-CM | POA: Diagnosis not present

## 2019-03-29 DIAGNOSIS — F419 Anxiety disorder, unspecified: Secondary | ICD-10-CM

## 2019-03-29 DIAGNOSIS — R600 Localized edema: Secondary | ICD-10-CM

## 2019-03-29 DIAGNOSIS — M810 Age-related osteoporosis without current pathological fracture: Secondary | ICD-10-CM | POA: Diagnosis not present

## 2019-03-29 DIAGNOSIS — M1990 Unspecified osteoarthritis, unspecified site: Secondary | ICD-10-CM

## 2019-03-29 NOTE — Progress Notes (Signed)
This service is provided via telemedicine  No vital signs collected/recorded due to the encounter was a telemedicine visit.   Location of patient (ex: home, work):  [place], [place]    Patient consents to a telephone visit: Yes   Location of the provider (ex: office, home): [employer], Office   Name of any referring provider:  N/A  Names of all persons participating in the telemedicine service and their role in the encounter: S.Chrae B/CMA, Sherrie Mustache, NP, Caregiver [name]) and Patient     Time spent on call:  7 min with medical assistant      Careteam: Patient Care Team: [name], NP as PCP - General (Geriatric Medicine) Jolene Schimke, MD as Referring Physician (Dermatology) Birder Robson, MD as Referring Physician (Ophthalmology) Jaymes Graff, DO as Consulting Physician (Orthopedic Surgery)  Advanced Directive information    No Known Allergies  Chief Complaint  Patient presents with  . Acute Visit    Discuss medications and review incoming fax from [facility]. Telehealth/video visit    . Agitation    Patient becomes easily upset.   Lysle Rubens, High fall risk. Patient fell this morning.      HPI: Patient is a 86 y.o. female  For follow up from nursing home after hospitalization.   Feel in October and suffered right hip fracture s/p ORIF with IM nail, went to SNF after hospitalization. Up and walking around with walker now. Prior to fracture did not use walker all the time. She gets agitated with using the walker. Having some pain. Tylenol is effective- gets tylenol 650 mg midday which helps pain.  Constipation- has miralax scheduled daily but always asking for softener- had loose stool today.  htn- 100/59, 137/62, 117/73 78 on metoprolol ER 25 mg,    LE edema- no swelling, on torsemide 20 mg daily with potassium supplement.   Chronic LE wounds- wound specialist at facility was wrapping both legs.  Wound with scabs at this time. Follow up with wound care center on 04/17/19.   Hyperlipidemia- continues on simvastatin 20 mg daily   Weight at 102.8 lbs on 03/29/19  Anxiety- appears to have had dose reduction of zoloft to 25 mg with buspirone 10 mg TID. More upset now that she is back at [facility]. Getting days and nights mixed up.  Also using trazodone 50 mg at bedtime.   Osteoporosis- continues on cal and vit d   Dementia with behaviors- ongoing decline. Continues on namenda 28 mg Review of Systems:  Review of Systems  Constitutional: Negative for chills, fever and weight loss.  HENT: Negative for tinnitus.   Respiratory: Negative for cough, sputum production and shortness of breath.   Cardiovascular: Negative for chest pain, palpitations and leg swelling.  Gastrointestinal: Negative for abdominal pain, constipation, diarrhea and heartburn.  Genitourinary: Negative for dysuria, frequency and urgency.  Musculoskeletal: Positive for falls, joint pain and myalgias. Negative for back pain.  Skin:       Chronic wound to LE   Neurological: Positive for weakness. Negative for dizziness and headaches.  Psychiatric/Behavioral: Positive for memory loss. Negative for depression. The patient is nervous/anxious and has insomnia.     Past Medical History:  Diagnosis Date  . A-fib ([place])   . Anemia   . Anxiety   . Dry skin    on ears  . Gastroesophageal reflux   . Hearing loss    significant  uses hearing aids  .  High blood pressure   . Hyperlipidemia   . Loss of appetite   . Loss of smell   . Macular degeneration   . Malnutrition ([place])   . Osteoporosis   . Persistent dry cough   . Skin cancer    basal / squamous  . Slow transit constipation    abstracted from new patient packet   . Vitamin D deficiency 01/26/2018   Past Surgical History:  Procedure Laterality Date  . FEMUR IM NAIL Right 12/28/2018   Procedure: INTRAMEDULLARY (IM) NAIL FEMORAL;  Surgeon: Dorna Leitz, MD;  Location:  [facility];  Service: Orthopedics;  Laterality: Right;   Social History:   reports that she has never smoked. She has never used smokeless tobacco. She reports that she does not drink alcohol or use drugs.  Family History  Problem Relation Age of Onset  . Atrial fibrillation Sister   . Diabetes Sister   . Osteoarthritis Sister   . Osteopenia Sister   . Mental retardation Sister     Medications: Patient's Medications  New Prescriptions   No medications on file  Previous Medications   ACETAMINOPHEN (TYLENOL) 325 MG TABLET    To take tylenol 650 mg midday, can take every 6 hours as needed pain max 3000 mg in 24/hours   ALPRAZOLAM (XANAX) 0.25 MG TABLET    Take 1 tablet (0.25 mg total) by mouth every 6 (six) hours as needed for anxiety.   ASPIRIN EC 325 MG TABLET    Take 1 tablet (325 mg total) by mouth daily after breakfast. Take x 1 month post op to decrease risk of blood clots.   BUSPIRONE (BUSPAR) 10 MG TABLET    Take 10 mg by mouth 3 (three) times daily.   CALCIUM CARBONATE (OS-CAL) 600 MG TABS TABLET    Take 600 mg by mouth every 12 (twelve) hours.    CHOLECALCIFEROL (VITAMIN D) 25 MCG (1000 UT) TABLET    Take 2,000 Units by mouth daily.   DOCUSATE SODIUM (COLACE) 100 MG CAPSULE    Take 100 mg by mouth every 12 (twelve) hours as needed for mild constipation.   ENSURE (ENSURE)    Take 237 mLs by mouth daily.   FAMOTIDINE (PEPCID) 20 MG TABLET    Take 1 tablet (20 mg total) by mouth at bedtime.   MEMANTINE (NAMENDA XR) 28 MG CP24 24 HR CAPSULE    Take 1 capsule (28 mg total) by mouth daily.   METOPROLOL SUCCINATE (TOPROL-XL) 25 MG 24 HR TABLET    Take 1 tablet (25 mg total) by mouth daily.   MULTIPLE VITAMIN (DAILY VITE) TABS    Take 1 tablet by mouth daily.   MULTIPLE VITAMINS-MINERALS (EQ VISION FORMULA 50+ PO)    Take 1 tablet by mouth every 12 (twelve) hours.   NYSTATIN CREAM (MYCOSTATIN)    Apply 1 application topically 3 (three) times daily. Spread topically to sacrum and right  buttocks   POLYETHYLENE GLYCOL (MIRALAX / GLYCOLAX) 17 G PACKET    Take 17 g by mouth daily.   POTASSIUM CHLORIDE (KLOR-CON) 20 MEQ TABLET    Take 1 tablet (20 mEq total) by mouth daily.   SERTRALINE (ZOLOFT) 50 MG TABLET    Take 1 tablet (50 mg total) by mouth daily.   SIMVASTATIN (ZOCOR) 20 MG TABLET    Take 1 tablet (20 mg total) by mouth daily at 6 PM.   TORSEMIDE (DEMADEX) 20 MG TABLET    Take 20 mg by mouth daily.  TRAMADOL (ULTRAM) 50 MG TABLET    Take 1 tablet (50 mg total) by mouth every 6 (six) hours as needed for moderate pain.   TRAZODONE (DESYREL) 50 MG TABLET    Take 50 mg by mouth at bedtime.  Modified Medications   No medications on file  Discontinued Medications   No medications on file    Physical Exam:  There were no vitals filed for this visit. There is no height or weight on file to calculate BMI. Wt Readings from Last 3 Encounters:  12/28/18 109 lb 5.6 oz (49.6 kg)  11/28/18 109 lb 6.4 oz (49.6 kg)  10/22/18 109 lb (49.4 kg)      Labs reviewed: Basic Metabolic Panel: Recent Labs    09/06/18 1433 09/16/18 2337 11/28/18 1211 12/27/18 2014 12/28/18 0537 12/28/18 1424 12/30/18 0527 12/31/18 0527 01/01/19 0602  NA 134*   < > 138   < > 139   < > 136 136 137  K 4.2   < > 3.8   < > 2.9*   < > 3.7 3.6 3.4*  CL 96*   < > 94*   < > 96*   < > 98 99 102  CO2 28   < > 35*   < > 32   < > 30 29 29   GLUCOSE 85   < > 76   < > 119*   < > 103* 99 97  BUN 13   < > 20   < > 20   < > 16 12 12   CREATININE 0.61   < > 0.72   < > 0.57   < > 0.33* 0.45 0.45  CALCIUM 9.2   < > 9.5   < > 8.9   < > 8.0* 7.8* 8.0*  MG  --   --   --   --  1.8  --   --   --  2.0  TSH 0.38*  --  1.49  --   --   --   --   --   --    < > = values in this interval not displayed.   Liver Function Tests: Recent Labs    09/16/18 2337 11/28/18 1211 12/27/18 2014  AST 54* 22 51*  ALT 28 11 12   ALKPHOS 81  --  82  BILITOT 1.4* 0.8 1.9*  PROT 5.9* 6.0* 6.5  ALBUMIN 3.7  --  3.6   No results  for input(s): LIPASE, AMYLASE in the last 8760 hours. No results for input(s): AMMONIA in the last 8760 hours. CBC: Recent Labs    09/06/18 1433 09/06/18 1433 09/16/18 2337 09/16/18 2337 12/27/18 2014 12/28/18 0035 12/30/18 0527 12/30/18 0527 12/31/18 0527 12/31/18 1425 01/01/19 0602  WBC 7.7   < > 10.3   < > 11.7*   < > 9.5  --  8.6  --  7.9  NEUTROABS 5,613  --  7.8*  --  9.1*  --   --   --   --   --   --   HGB 13.0   < > 12.5   < > 12.8   < > 8.2*   < > 6.8* 8.2* 7.7*  HCT 40.4   < > 39.6   < > 40.3   < > 26.0*   < > 22.2* 25.9* 24.4*  MCV 83.6   < > 84.4   < > 84.3   < > 87.5  --  88.8  --  87.8  PLT 233   < > 193   < > 213   < > 111*  --  113*  --  124*   < > = values in this interval not displayed.   Lipid Panel: Recent Labs    09/06/18 1433  CHOL 116  HDL 55  LDLCALC 43  TRIG 101  CHOLHDL 2.1   TSH: Recent Labs    09/06/18 1433 11/28/18 1211  TSH 0.38* 1.49   A1C: No results found for: HGBA1C   Assessment/Plan 1. Estrogen deficiency - DG Bone Density; Future  2. Age-related osteoporosis without current pathological fracture -continues on cal and vit d, will follow up dexa scan.   3. Essential hypertension -stable on metoprolol ER with torsemide and potassium supplement.   4. Dementia with behavioral disturbance, unspecified dementia type ([place]) Continues to decline with memory. Has caregive to help with ADLs at AL. Continues on namenda at this time.  5. Bilateral lower extremity edema Controlled on torsemide and potassium supplement.  Continue to use compression wraps with elevation of LE   6. Osteoarthritis, unspecified osteoarthritis type, unspecified site Stable, using tylenol with relief of pain.   7. Paroxysmal atrial fibrillation (HCC) Rate controlled, not on anticoagulation due to high fall rks.   8. Anxiety Ongoing but stable. Transitioning to being back at [place]. Days and nights have been mixed up. Trazodone was added while at [facility]. Will  continue current regimen.   Next appt: 3 months for routine follow up.  Carlos American. Harle Battiest  [facility] [phone]   Virtual Visit via Video Note  I connected with Jamine Casserly on 03/29/19 at  2:45 PM EST by a video enabled telemedicine application and verified that I am speaking with the correct person using two identifiers.  Location: Patient: home at [facility] Provider: office   I discussed the limitations of evaluation and management by telemedicine and the availability of in person appointments. The patient expressed understanding and agreed to proceed.    I discussed the assessment and treatment plan with the patient. The patient was provided an opportunity to ask questions and all were answered. The patient agreed with the plan and demonstrated an understanding of the instructions.   The patient was advised to call back or seek an in-person evaluation if the symptoms worsen or if the condition fails to improve as anticipated.  I provided 25 minutes of non-face-to-face time during this encounter.  Carlos American. Dewaine Oats, AGNP Avs printed and mailed.

## 2019-03-29 NOTE — Telephone Encounter (Signed)
Incoming fax received from [facility] requesting clarification on acetaminophen order.   Per Janett Billow patient needs to schedule a video/virtal visit.   Fax was sent back to [phone] for staff to review Jessica's handwritten note requesting appointment.   Detailed message left on voicemail for [name], patients niece:  Someone will need to call [name] to orchestrate having someone available for a video/virtual visit with Janett Billow. After a date and time is confirmed with the facility [name] is to call our office to schedule an appointment.

## 2019-04-08 ENCOUNTER — Telehealth: Payer: Self-pay | Admitting: *Deleted

## 2019-04-08 NOTE — Telephone Encounter (Signed)
Lujean Rave with [facility] called and left message on Clinical intake and stated that she was calling regarding a increase in patient's weight.   I returned called and spoke with Josphina, med tech with [employer] and she stated that Casimer Bilis had already left and she did not know why she was calling and she would leave a message for her to return call.   Awaiting callback.

## 2019-04-11 NOTE — Telephone Encounter (Signed)
I called Jasmine Jones and she state this message was further followed up through via  fax. No further action needed in this telephone encounter.   Correspondence is in [place] review and sign folder for a final statement/reply

## 2019-04-12 ENCOUNTER — Other Ambulatory Visit: Payer: Self-pay

## 2019-04-12 ENCOUNTER — Encounter: Payer: Self-pay | Admitting: Nurse Practitioner

## 2019-04-12 ENCOUNTER — Ambulatory Visit
Admission: RE | Admit: 2019-04-12 | Discharge: 2019-04-12 | Disposition: A | Discharge status: Home / Self Care | Payer: Medicare Other | Source: Ambulatory Visit | Attending: Nurse Practitioner | Admitting: Nurse Practitioner

## 2019-04-12 ENCOUNTER — Ambulatory Visit (INDEPENDENT_AMBULATORY_CARE_PROVIDER_SITE_OTHER): Payer: Medicare Other | Admitting: Nurse Practitioner

## 2019-04-12 ENCOUNTER — Telehealth: Payer: Self-pay | Admitting: *Deleted

## 2019-04-12 VITALS — BP 134/80 | HR 80 | Temp 97.7°F | Resp 16 | Ht 63.0 in | Wt 115.2 lb

## 2019-04-12 DIAGNOSIS — I700 Atherosclerosis of aorta: Secondary | ICD-10-CM

## 2019-04-12 DIAGNOSIS — R0602 Shortness of breath: Secondary | ICD-10-CM

## 2019-04-12 DIAGNOSIS — Z7189 Other specified counseling: Secondary | ICD-10-CM

## 2019-04-12 NOTE — Telephone Encounter (Signed)
Received fax from [facility] at [facility] [phone] stating that patient is having SOB and Weight Gain.   Per Janett Billow: Please have patient come into office for evaluation.   I called [facility] and Med Tech stated that Niece, [name] takes patient to Dr. Visits and gave me her number to call to schedule an appointment   [name], Niece [phone]  I called and spoke with [name] and she stated that she will check her schedule and call us back to schedule an appointment for patient to be seen. Awaiting callback.

## 2019-04-12 NOTE — Telephone Encounter (Signed)
Niece called back and scheduled an appointment for today at 2:15 with Janett Billow. Requested to see Janett Billow.

## 2019-04-12 NOTE — Progress Notes (Signed)
Careteam: Patient Care Team: [name], NP as PCP - General (Geriatric Medicine) Jolene Schimke, MD as Referring Physician (Dermatology) Birder Robson, MD as Referring Physician (Ophthalmology) Jaymes Graff, DO as Consulting Physician (Orthopedic Surgery)  Advanced Directive information Does Patient Have a Medical Advance Directive?: Yes, Type of Advance Directive: Healthcare Power of Attorney  No Known Allergies  Chief Complaint  Patient presents with  . Acute Visit    Weight Gain/SOB/ Accompanied by her niece [name].     HPI: Patient is a 86 y.o. female  Facility noted weight gain and this morning noted to have increase in shortness of breathing. She has had a noted weight gain in facility over the last week.  Weights- 108.4 lbs on 1/27 111.8  lb on 2/1 112.8 lbs on 2/3  She feel 3 days ago, no injury other than skin tear noted. Treated it per facility protocol per home health. Skin tear on left arm  Reports she is eating well but no cignificant changes in eating patterns   There is no changes in swelling to LE, legs being wrapped with by home health. No swelling to upper legs noted.   Pt without increase in breathing or shortness of breath noted during OV.   She is very HOH with progressive dementia limiting hx. [name] her Chauncey Reading is with her today but unable to visit her at facility due to [place].   Review of Systems:  Review of Systems  Unable to perform ROS: Dementia    Past Medical History:  Diagnosis Date  . A-fib ([place])   . Anemia   . Anxiety   . Dry skin    on ears  . Gastroesophageal reflux   . Hearing loss    significant  uses hearing aids  . High blood pressure   . Hyperlipidemia   . Loss of appetite   . Loss of smell   . Macular degeneration   . Malnutrition ([place])   . Osteoporosis   . Persistent dry cough   . Skin cancer    basal / squamous  . Slow transit constipation    abstracted from new patient packet   . Vitamin D  deficiency 01/26/2018   Past Surgical History:  Procedure Laterality Date  . FEMUR IM NAIL Right 12/28/2018   Procedure: INTRAMEDULLARY (IM) NAIL FEMORAL;  Surgeon: Dorna Leitz, MD;  Location: [facility];  Service: Orthopedics;  Laterality: Right;   Social History:   reports that she has never smoked. She has never used smokeless tobacco. She reports that she does not drink alcohol or use drugs.  Family History  Problem Relation Age of Onset  . Atrial fibrillation Sister   . Diabetes Sister   . Osteoarthritis Sister   . Osteopenia Sister   . Mental retardation Sister     Medications: Patient's Medications  New Prescriptions   No medications on file  Previous Medications   ACETAMINOPHEN (TYLENOL) 325 MG TABLET    To take tylenol 650 mg midday, can take every 6 hours as needed pain max 3000 mg in 24/hours   ALPRAZOLAM (XANAX) 0.25 MG TABLET    Take 1 tablet (0.25 mg total) by mouth every 6 (six) hours as needed for anxiety.   ASPIRIN EC 325 MG TABLET    Take 1 tablet (325 mg total) by mouth daily after breakfast. Take x 1 month post op to decrease risk of blood clots.   BUSPIRONE (BUSPAR) 10 MG TABLET    Take 10  mg by mouth 3 (three) times daily.   CALCIUM CARBONATE (OS-CAL) 600 MG TABS TABLET    Take 600 mg by mouth every 12 (twelve) hours.    CHOLECALCIFEROL (VITAMIN D) 25 MCG (1000 UT) TABLET    Take 2,000 Units by mouth daily.   DOCUSATE SODIUM (COLACE) 100 MG CAPSULE    Take 100 mg by mouth every 12 (twelve) hours as needed for mild constipation.   ENSURE (ENSURE)    Take 237 mLs by mouth daily.   FAMOTIDINE (PEPCID) 20 MG TABLET    Take 1 tablet (20 mg total) by mouth at bedtime.   MEMANTINE (NAMENDA XR) 28 MG CP24 24 HR CAPSULE    Take 1 capsule (28 mg total) by mouth daily.   METOPROLOL SUCCINATE (TOPROL-XL) 25 MG 24 HR TABLET    Take 1 tablet (25 mg total) by mouth daily.   MULTIPLE VITAMIN (DAILY VITE) TABS    Take 1 tablet by mouth daily.   MULTIPLE VITAMINS-MINERALS (EQ  VISION FORMULA 50+ PO)    Take 1 tablet by mouth every 12 (twelve) hours.   NYSTATIN CREAM (MYCOSTATIN)    Apply 1 application topically 3 (three) times daily. Spread topically to sacrum and right buttocks   POLYETHYLENE GLYCOL (MIRALAX / GLYCOLAX) 17 G PACKET    Take 17 g by mouth daily.   POTASSIUM CHLORIDE (KLOR-CON) 20 MEQ TABLET    Take 1 tablet (20 mEq total) by mouth daily.   SERTRALINE (ZOLOFT) 50 MG TABLET    Take 1 tablet (50 mg total) by mouth daily.   SIMVASTATIN (ZOCOR) 20 MG TABLET    Take 1 tablet (20 mg total) by mouth daily at 6 PM.   TORSEMIDE (DEMADEX) 20 MG TABLET    Take 20 mg by mouth daily.   TRAMADOL (ULTRAM) 50 MG TABLET    Take 1 tablet (50 mg total) by mouth every 6 (six) hours as needed for moderate pain.   TRAZODONE (DESYREL) 50 MG TABLET    Take 50 mg by mouth at bedtime.  Modified Medications   No medications on file  Discontinued Medications   No medications on file    Physical Exam:  Vitals:   04/12/19 1433  BP: 134/80  Pulse: 80  Resp: 16  Temp: 97.7 F (36.5 C)  SpO2: 96%  Weight: 115 lb 3.2 oz (52.3 kg)  Height: 5\' 3"  (1.6 m)   Body mass index is 20.41 kg/m. Wt Readings from Last 3 Encounters:  04/12/19 115 lb 3.2 oz (52.3 kg)  12/28/18 109 lb 5.6 oz (49.6 kg)  11/28/18 109 lb 6.4 oz (49.6 kg)    Physical Exam Constitutional:      General: She is not in acute distress.    Appearance: She is well-developed. She is not diaphoretic.  HENT:     Head: Normocephalic and atraumatic.     Ears:     Comments: Very HOH    Mouth/Throat:     Pharynx: No oropharyngeal exudate.  Eyes:     Conjunctiva/sclera: Conjunctivae normal.     Pupils: Pupils are equal, round, and reactive to light.  Cardiovascular:     Rate and Rhythm: Normal rate and regular rhythm.     Comments: Distant heart sounds Pulmonary:     Effort: Pulmonary effort is normal.     Breath sounds: Decreased breath sounds present.  Abdominal:     General: Bowel sounds are normal.  There is no distension.     Palpations: Abdomen  is soft.     Tenderness: There is no abdominal tenderness.  Musculoskeletal:        General: No tenderness.     Cervical back: Normal range of motion and neck supple.     Right lower leg: No edema.     Left lower leg: No edema.  Skin:    General: Skin is warm and dry.  Neurological:     Mental Status: She is alert. Mental status is at baseline.     Labs reviewed: Basic Metabolic Panel: Recent Labs    09/06/18 1433 09/16/18 2337 11/28/18 1211 12/27/18 2014 12/28/18 0537 12/28/18 1424 12/30/18 0527 12/31/18 0527 01/01/19 0602  NA 134*   < > 138   < > 139   < > 136 136 137  K 4.2   < > 3.8   < > 2.9*   < > 3.7 3.6 3.4*  CL 96*   < > 94*   < > 96*   < > 98 99 102  CO2 28   < > 35*   < > 32   < > 30 29 29   GLUCOSE 85   < > 76   < > 119*   < > 103* 99 97  BUN 13   < > 20   < > 20   < > 16 12 12   CREATININE 0.61   < > 0.72   < > 0.57   < > 0.33* 0.45 0.45  CALCIUM 9.2   < > 9.5   < > 8.9   < > 8.0* 7.8* 8.0*  MG  --   --   --   --  1.8  --   --   --  2.0  TSH 0.38*  --  1.49  --   --   --   --   --   --    < > = values in this interval not displayed.   Liver Function Tests: Recent Labs    09/16/18 2337 11/28/18 1211 12/27/18 2014  AST 54* 22 51*  ALT 28 11 12   ALKPHOS 81  --  82  BILITOT 1.4* 0.8 1.9*  PROT 5.9* 6.0* 6.5  ALBUMIN 3.7  --  3.6   No results for input(s): LIPASE, AMYLASE in the last 8760 hours. No results for input(s): AMMONIA in the last 8760 hours. CBC: Recent Labs    09/06/18 1433 09/06/18 1433 09/16/18 2337 09/16/18 2337 12/27/18 2014 12/28/18 0035 12/30/18 0527 12/30/18 0527 12/31/18 0527 12/31/18 1425 01/01/19 0602  WBC 7.7   < > 10.3   < > 11.7*   < > 9.5  --  8.6  --  7.9  NEUTROABS 5,613  --  7.8*  --  9.1*  --   --   --   --   --   --   HGB 13.0   < > 12.5   < > 12.8   < > 8.2*   < > 6.8* 8.2* 7.7*  HCT 40.4   < > 39.6   < > 40.3   < > 26.0*   < > 22.2* 25.9* 24.4*  MCV 83.6   < >  84.4   < > 84.3   < > 87.5  --  88.8  --  87.8  PLT 233   < > 193   < > 213   < > 111*  --  113*  --  124*   < > = values  in this interval not displayed.   Lipid Panel: Recent Labs    09/06/18 1433  CHOL 116  HDL 55  LDLCALC 43  TRIG 101  CHOLHDL 2.1   TSH: Recent Labs    09/06/18 1433 11/28/18 1211  TSH 0.38* 1.49   A1C: No results found for: HGBA1C   Assessment/Plan 1. Aortic atherosclerosis ([place]) Noted on chest xray from last hospitalization, continues on ASA and zocor  2. Shortness of breath on exertion -pt reports some shortness of breath on exertion with increase weigh over the last week. There is NO abnormal swelling on exam. LE wrapped and swelling at baseline if not somewhat improved from what she has been in the past. -O2 levels baseline. -no shortness of breath or increase work of breathing noted in office. -chest xray reviewed from hospitalization and without hx of CHF -she remains on demadex for LE edema.  -continue twice weekly weights - DG Chest 2 View; Future- for further evaluation.  - Brain Natriuretic Peptide - BASIC METABOLIC PANEL WITH GFR - CBC with Differential/Platelet  3. Advance care planning -most form given for family to review, can make virtual or telephone visit to complete.  - DNR (Do Not Resuscitate)  Return/follow up precautions given to niece. Can call oncall or office if symptoms worsen. Carlos American. [place], [facility] [phone]

## 2019-04-13 LAB — CBC WITH DIFFERENTIAL/PLATELET
Absolute Monocytes: 567 cells/uL (ref 200–950)
Basophils Absolute: 32 cells/uL (ref 0–200)
Basophils Relative: 0.6 %
Eosinophils Absolute: 49 cells/uL (ref 15–500)
Eosinophils Relative: 0.9 %
HCT: 35.7 % (ref 35.0–45.0)
Hemoglobin: 11.4 g/dL — ABNORMAL LOW (ref 11.7–15.5)
Lymphs Abs: 1512 cells/uL (ref 850–3900)
MCH: 26.1 pg — ABNORMAL LOW (ref 27.0–33.0)
MCHC: 31.9 g/dL — ABNORMAL LOW (ref 32.0–36.0)
MCV: 81.9 fL (ref 80.0–100.0)
MPV: 10.8 fL (ref 7.5–12.5)
Monocytes Relative: 10.5 %
Neutro Abs: 3240 cells/uL (ref 1500–7800)
Neutrophils Relative %: 60 %
Platelets: 234 10*3/uL (ref 140–400)
RBC: 4.36 10*6/uL (ref 3.80–5.10)
RDW: 12.8 % (ref 11.0–15.0)
Total Lymphocyte: 28 %
WBC: 5.4 10*3/uL (ref 3.8–10.8)

## 2019-04-13 LAB — BASIC METABOLIC PANEL WITH GFR
BUN: 17 mg/dL (ref 7–25)
CO2: 29 mmol/L (ref 20–32)
Calcium: 9.1 mg/dL (ref 8.6–10.4)
Chloride: 103 mmol/L (ref 98–110)
Creat: 0.79 mg/dL (ref 0.60–0.88)
GFR, Est African American: 75 mL/min/{1.73_m2} (ref >=60)
GFR, Est Non African American: 65 mL/min/{1.73_m2} (ref >=60)
Glucose, Bld: 86 mg/dL (ref 65–139)
Potassium: 4 mmol/L (ref 3.5–5.3)
Sodium: 142 mmol/L (ref 135–146)

## 2019-04-13 LAB — BRAIN NATRIURETIC PEPTIDE: Brain Natriuretic Peptide: 135 pg/mL — ABNORMAL HIGH (ref <100)

## 2019-04-15 ENCOUNTER — Encounter: Payer: Self-pay | Admitting: Nurse Practitioner

## 2019-04-15 NOTE — Telephone Encounter (Signed)
Called [name] and discussed.

## 2019-04-15 NOTE — Telephone Encounter (Signed)
Message routed to [place], Jessica K, NP  

## 2019-04-17 ENCOUNTER — Encounter (HOSPITAL_BASED_OUTPATIENT_CLINIC_OR_DEPARTMENT_OTHER): Payer: Medicare Other | Admitting: Physician Assistant

## 2019-04-26 ENCOUNTER — Encounter: Payer: Self-pay | Admitting: Nurse Practitioner

## 2019-04-29 ENCOUNTER — Ambulatory Visit (INDEPENDENT_AMBULATORY_CARE_PROVIDER_SITE_OTHER): Payer: Medicare Other | Admitting: Nurse Practitioner

## 2019-04-29 ENCOUNTER — Other Ambulatory Visit: Payer: Self-pay

## 2019-04-29 ENCOUNTER — Encounter: Payer: Self-pay | Admitting: Nurse Practitioner

## 2019-04-29 VITALS — BP 118/70 | HR 65 | Temp 97.3°F | Ht 63.0 in | Wt 104.0 lb

## 2019-04-29 DIAGNOSIS — M25512 Pain in left shoulder: Secondary | ICD-10-CM | POA: Diagnosis not present

## 2019-04-29 DIAGNOSIS — K591 Functional diarrhea: Secondary | ICD-10-CM

## 2019-04-29 DIAGNOSIS — W19XXXA Unspecified fall, initial encounter: Secondary | ICD-10-CM | POA: Diagnosis not present

## 2019-04-29 DIAGNOSIS — F0391 Unspecified dementia with behavioral disturbance: Secondary | ICD-10-CM

## 2019-04-29 DIAGNOSIS — R0789 Other chest pain: Secondary | ICD-10-CM | POA: Diagnosis not present

## 2019-04-29 MED ORDER — SIMVASTATIN 20 MG PO TABS: 20.0000 mg | ORAL_TABLET | Freq: Every day | ORAL | 1 refills | Status: DC

## 2019-04-29 NOTE — Progress Notes (Signed)
Careteam: Patient Care Team: [name], NP as PCP - General (Geriatric Medicine) Jolene Schimke, MD as Referring Physician (Dermatology) Birder Robson, MD as Referring Physician (Ophthalmology) Jaymes Graff, DO as Consulting Physician (Orthopedic Surgery)  Advanced Directive information    No Known Allergies  Chief Complaint  Patient presents with  . Acute Visit    Knot on left side of chest since Friday. Patient c/o left shoulder hurting since fall on 04/12/2019(evening). Here with caregiver [name]  . Wound Check    Examine wraps on both legs. Patient will be seen at wound center this Wednesday   . Constipation    Patient with reoccuring constipation. Had diarrhea 1 week ago   . Medication Management    Discuss Tylenol and Tramadol frequency. Patient in pain after tylenol.      HPI: Patient is a 86 y.o. female for follow up.  Pt has had multiple falls in her room. Does not like to wear shoes with backs and she got caught up in her bedroom slippers and feel. She refused to go with EMT and VSS. After the fall her shoulder started to hurt. After a few days they noticed a knot on her chest that was tender but now this does not bother her and now she is complaining about her back hurting. Then fell again yesterday.  First fall was 04/12/19 then 04/28/19. Having pain during the day after tylenol. She has tramadol if she needs it PRN.  She complains of the front of her chest hurts.   Looking to getting a sitter with her more hours due to her progressive dementia and increase falls.  Reports she is taking shakes and pepsi but not urinating like she used to. Question if she is still on a fluid pill- taking torsemide 20 mg daily   Had diarrhea after drinking 11 nutritional drinks in 2 days.   Ace wraps to bilateral legs, appt with wound care Wednesday, wound has healed per [name].   No swelling to legs.    Review of Systems:  Review of Systems  Unable to perform  ROS: Dementia    Past Medical History:  Diagnosis Date  . A-fib ([place])   . Anemia   . Anxiety   . Dry skin    on ears  . Gastroesophageal reflux   . Hearing loss    significant  uses hearing aids  . High blood pressure   . Hyperlipidemia   . Loss of appetite   . Loss of smell   . Macular degeneration   . Malnutrition ([place])   . Osteoporosis   . Persistent dry cough   . Skin cancer    basal / squamous  . Slow transit constipation    abstracted from new patient packet   . Vitamin D deficiency 01/26/2018   Past Surgical History:  Procedure Laterality Date  . FEMUR IM NAIL Right 12/28/2018   Procedure: INTRAMEDULLARY (IM) NAIL FEMORAL;  Surgeon: Dorna Leitz, MD;  Location: [facility];  Service: Orthopedics;  Laterality: Right;   Social History:   reports that she has never smoked. She has never used smokeless tobacco. She reports that she does not drink alcohol or use drugs.  Family History  Problem Relation Age of Onset  . Atrial fibrillation Sister   . Diabetes Sister   . Osteoarthritis Sister   . Osteopenia Sister   . Mental retardation Sister     Medications: Patient's Medications  New Prescriptions   No medications  on file  Previous Medications   ACETAMINOPHEN (TYLENOL) 325 MG TABLET    Take 650 mg by mouth daily. Mid-day and every 6 hours as needed   ALPRAZOLAM (XANAX) 0.25 MG TABLET    Take 1 tablet (0.25 mg total) by mouth every 6 (six) hours as needed for anxiety.   ASPIRIN EC 325 MG TABLET    Take 1 tablet (325 mg total) by mouth daily after breakfast. Take x 1 month post op to decrease risk of blood clots.   BUSPIRONE (BUSPAR) 10 MG TABLET    Take 10 mg by mouth 3 (three) times daily.   CALCIUM CARBONATE (OS-CAL) 600 MG TABS TABLET    Take 600 mg by mouth every 12 (twelve) hours.    CHOLECALCIFEROL (VITAMIN D) 25 MCG (1000 UT) TABLET    Take 2,000 Units by mouth daily.   DOCUSATE SODIUM (COLACE) 100 MG CAPSULE    Take 100 mg by mouth every 12 (twelve) hours as  needed for mild constipation.   ENSURE (ENSURE)    Take 237 mLs by mouth daily.   FAMOTIDINE (PEPCID) 20 MG TABLET    Take 1 tablet (20 mg total) by mouth at bedtime.   MEMANTINE (NAMENDA XR) 28 MG CP24 24 HR CAPSULE    Take 1 capsule (28 mg total) by mouth daily.   METOPROLOL SUCCINATE (TOPROL-XL) 25 MG 24 HR TABLET    Take 1 tablet (25 mg total) by mouth daily.   MULTIPLE VITAMIN (DAILY VITE) TABS    Take 1 tablet by mouth daily.   MULTIPLE VITAMINS-MINERALS (EQ VISION FORMULA 50+ PO)    Take 1 tablet by mouth every 12 (twelve) hours.   NYSTATIN CREAM (MYCOSTATIN)    Apply 1 application topically 3 (three) times daily. Spread topically to sacrum and right buttocks   POLYETHYLENE GLYCOL (MIRALAX / GLYCOLAX) 17 G PACKET    Take 17 g by mouth daily.   POTASSIUM CHLORIDE (KLOR-CON) 20 MEQ TABLET    Take 1 tablet (20 mEq total) by mouth daily.   SACCHAROMYCES BOULARDII (FLORASTOR) 250 MG CAPSULE    Take 250 mg by mouth 2 (two) times daily.   SERTRALINE (ZOLOFT) 50 MG TABLET    Take 1 tablet (50 mg total) by mouth daily.   SIMVASTATIN (ZOCOR) 20 MG TABLET    Take 1 tablet (20 mg total) by mouth daily at 6 PM.   TORSEMIDE (DEMADEX) 20 MG TABLET    Take 20 mg by mouth daily.   TRAMADOL (ULTRAM) 50 MG TABLET    Take 50 mg by mouth every 4 (four) hours as needed (while awake as needed).   TRAZODONE (DESYREL) 50 MG TABLET    Take 50 mg by mouth at bedtime.  Modified Medications   No medications on file  Discontinued Medications   ACETAMINOPHEN (TYLENOL) 325 MG TABLET    To take tylenol 650 mg midday, can take every 6 hours as needed pain max 3000 mg in 24/hours   TRAMADOL (ULTRAM) 50 MG TABLET    Take 1 tablet (50 mg total) by mouth every 6 (six) hours as needed for moderate pain.    Physical Exam:  Vitals:   04/29/19 1552  BP: 118/70  Pulse: 65  Temp: (!) 97.3 F (36.3 C)  TempSrc: Temporal  SpO2: 97%  Weight: 104 lb (47.2 kg)  Height: 5\' 3"  (1.6 m)   Body mass index is 18.42 kg/m. Wt  Readings from Last 3 Encounters:  04/29/19 104 lb (47.2 kg)  04/12/19 115 lb 3.2 oz (52.3 kg)  12/28/18 109 lb 5.6 oz (49.6 kg)    Physical Exam Constitutional:      Appearance: She is underweight.  HENT:     Head: Normocephalic and atraumatic.  Eyes:     Pupils: Pupils are equal, round, and reactive to light.  Cardiovascular:     Rate and Rhythm: Normal rate and regular rhythm.  Pulmonary:     Effort: Pulmonary effort is normal.     Breath sounds: Normal breath sounds.  Chest:     Chest wall: Deformity and tenderness present.       Comments: Tenderness with bony prominence noted to sternoclavicular joint.  Bruising noted to left chest going down into abdomen- no tenderness other than clavicle  Limited ROM to left shoulder however this is chronic.  Abdominal:     General: Abdomen is flat.     Palpations: Abdomen is soft.  Musculoskeletal:        General: No swelling.     Left shoulder: Tenderness present. Decreased range of motion.     Left upper arm: No swelling, edema, deformity, lacerations or tenderness.     Cervical back: Normal range of motion and neck supple. Tenderness and bony tenderness present.  Neurological:     Mental Status: She is alert.     Labs reviewed: Basic Metabolic Panel: Recent Labs    09/06/18 1433 09/16/18 2337 11/28/18 1211 12/27/18 2014 12/28/18 0537 12/28/18 1424 12/31/18 0527 01/01/19 0602 04/12/19 1527  NA 134*   < > 138   < > 139   < > 136 137 142  K 4.2   < > 3.8   < > 2.9*   < > 3.6 3.4* 4.0  CL 96*   < > 94*   < > 96*   < > 99 102 103  CO2 28   < > 35*   < > 32   < > 29 29 29   GLUCOSE 85   < > 76   < > 119*   < > 99 97 86  BUN 13   < > 20   < > 20   < > 12 12 17   CREATININE 0.61   < > 0.72   < > 0.57   < > 0.45 0.45 0.79  CALCIUM 9.2   < > 9.5   < > 8.9   < > 7.8* 8.0* 9.1  MG  --   --   --   --  1.8  --   --  2.0  --   TSH 0.38*  --  1.49  --   --   --   --   --   --    < > = values in this interval not displayed.    Liver Function Tests: Recent Labs    09/16/18 2337 11/28/18 1211 12/27/18 2014  AST 54* 22 51*  ALT 28 11 12   ALKPHOS 81  --  82  BILITOT 1.4* 0.8 1.9*  PROT 5.9* 6.0* 6.5  ALBUMIN 3.7  --  3.6   No results for input(s): LIPASE, AMYLASE in the last 8760 hours. No results for input(s): AMMONIA in the last 8760 hours. CBC: Recent Labs    09/16/18 2337 09/16/18 2337 12/27/18 2014 12/28/18 0035 12/31/18 0527 12/31/18 0527 12/31/18 1425 01/01/19 0602 04/12/19 1527  WBC 10.3   < > 11.7*   < > 8.6  --   --  7.9 5.4  NEUTROABS  7.8*  --  9.1*  --   --   --   --   --  3,240  HGB 12.5   < > 12.8   < > 6.8*   < > 8.2* 7.7* 11.4*  HCT 39.6   < > 40.3   < > 22.2*   < > 25.9* 24.4* 35.7  MCV 84.4   < > 84.3   < > 88.8  --   --  87.8 81.9  PLT 193   < > 213   < > 113*  --   --  124* 234   < > = values in this interval not displayed.   Lipid Panel: Recent Labs    09/06/18 1433  CHOL 116  HDL 55  LDLCALC 43  TRIG 101  CHOLHDL 2.1   TSH: Recent Labs    09/06/18 1433 11/28/18 1211  TSH 0.38* 1.49   A1C: No results found for: HGBA1C   Assessment/Plan 1. Fall, initial encounter -2 falls over the last month. Family looking into increase in supervision. She does not like to wear shoes properly and we discussed this. Also to use walker when ambulating. - DG Shoulder Left; Future - DG Chest 2 View; Future  2. Other chest pain -noted from the fall. Will get chest xray for further evaluation -also has tramadol to use if needed in addition to her scheduled tylenol. AL staff encouraged to routinely assess for pain. - DG Shoulder Left; Future - DG Chest 2 View; Future  3. Acute pain of left shoulder -again after fall. Noted with bruising. She has limited ROm at baseline. Again encouraged to use tramadol as needed and strict fall precautions.  - DG Shoulder Left; Future - DG Chest 2 View; Future  4. Dementia with behavioral disturbance, unspecified dementia type ([place])  -progressive decline. Discussed with niece, she would like comfort measures at this time as she has since a significant decline in the last few months.  - Ambulatory referral to Hospice for evaluation and support.  5. Diarrhea Thought to be due to having too many nutritional supplements over the weekend, this has now resolved and she tends to be more on the constipated side, encouraged to restart stool softener and mirlaax   Next appt: 3 months  Adelyn Roscher K. [place], [facility] [phone]

## 2019-04-29 NOTE — Patient Instructions (Signed)
Hospice referral placed  Hold stools softeners/laxatives for diarrhea  To give tramadol 50 mg every 4 hours as needed for pain

## 2019-05-01 ENCOUNTER — Telehealth: Payer: Self-pay

## 2019-05-01 ENCOUNTER — Other Ambulatory Visit: Payer: Self-pay

## 2019-05-01 ENCOUNTER — Other Ambulatory Visit: Payer: Self-pay | Admitting: Nurse Practitioner

## 2019-05-01 ENCOUNTER — Ambulatory Visit
Admission: RE | Admit: 2019-05-01 | Discharge: 2019-05-01 | Disposition: A | Discharge status: Home / Self Care | Payer: Medicare Other | Source: Ambulatory Visit | Attending: Nurse Practitioner | Admitting: Nurse Practitioner

## 2019-05-01 ENCOUNTER — Encounter (HOSPITAL_BASED_OUTPATIENT_CLINIC_OR_DEPARTMENT_OTHER): Payer: Medicare Other | Attending: Physician Assistant | Admitting: Physician Assistant

## 2019-05-01 DIAGNOSIS — M545 Low back pain, unspecified: Secondary | ICD-10-CM

## 2019-05-01 DIAGNOSIS — W19XXXA Unspecified fall, initial encounter: Secondary | ICD-10-CM

## 2019-05-01 DIAGNOSIS — M25512 Pain in left shoulder: Secondary | ICD-10-CM

## 2019-05-01 DIAGNOSIS — I872 Venous insufficiency (chronic) (peripheral): Secondary | ICD-10-CM | POA: Diagnosis not present

## 2019-05-01 DIAGNOSIS — F0150 Vascular dementia without behavioral disturbance: Secondary | ICD-10-CM | POA: Diagnosis not present

## 2019-05-01 DIAGNOSIS — I890 Lymphedema, not elsewhere classified: Secondary | ICD-10-CM | POA: Diagnosis not present

## 2019-05-01 DIAGNOSIS — H3530 Unspecified macular degeneration: Secondary | ICD-10-CM | POA: Diagnosis not present

## 2019-05-01 DIAGNOSIS — L97929 Non-pressure chronic ulcer of unspecified part of left lower leg with unspecified severity: Secondary | ICD-10-CM | POA: Insufficient documentation

## 2019-05-01 DIAGNOSIS — I252 Old myocardial infarction: Secondary | ICD-10-CM | POA: Diagnosis not present

## 2019-05-01 DIAGNOSIS — I10 Essential (primary) hypertension: Secondary | ICD-10-CM | POA: Insufficient documentation

## 2019-05-01 DIAGNOSIS — R0789 Other chest pain: Secondary | ICD-10-CM

## 2019-05-01 DIAGNOSIS — Z8249 Family history of ischemic heart disease and other diseases of the circulatory system: Secondary | ICD-10-CM | POA: Diagnosis not present

## 2019-05-01 NOTE — Telephone Encounter (Signed)
Order placed, make sure they are giving her the tramadol as needed for pain. Can we check on the hospice consult to see if they have reached out

## 2019-05-01 NOTE — Telephone Encounter (Signed)
[name] called and stated that patient has an appointment for x-rays today at [facility]. Patient is screaming in pain from lower back pain.  She was asking if an order for lower back pain could be ordered to already scheduled x-rays.

## 2019-05-02 NOTE — Progress Notes (Signed)
BERNIECE, REMO ([id number]) Visit Report for 05/01/2019 Chief Complaint Document Details Patient Name: Date of Service: Jasmine Jones, Jasmine Jones 05/01/2019 1:15 PM Medical Record [id number] Patient Account Number: [id number] Date of Birth/Sex: Treating RN: 1925-05-31 (86 y.o. Elam Dutch Primary Care Provider: Sherrie Mustache Other Clinician: Referring Provider: Treating Provider/Extender:[name], Mardi Mainland, Loni Muse in Treatment: 0 Information Obtained from: Patient Chief Complaint Bilateral LE Lymphedema Electronic Signature(s) Signed: 05/01/2019 2:27:28 PM By: Worthy Keeler PA-C Entered By: Worthy Keeler on 05/01/2019 14:27:28 -------------------------------------------------------------------------------- HPI Details Patient Name: Date of Service: Jasmine Jones, Jasmine Jones 05/01/2019 1:15 PM Medical Record [id number] Patient Account Number: [id number] Date of Birth/Sex: Treating RN: 10/04/1925 (86 y.o. Elam Dutch Primary Care Provider: Sherrie Mustache Other Clinician: Referring Provider: Treating Provider/Extender:[name], Mardi Mainland, Loni Muse in Treatment: 0 History of Present Illness HPI Description: 10/31/2018 on evaluation today patient presents for initial evaluation in our clinic concerning an issue that began 1 month ago. She has wounds over the left lower extremity which unfortunately have been giving her trouble. She does have a history of lymphedema as well as venous insufficiency. The patient also has hypertension and dementia. Incidentally she is also very hard of hearing and fortunately her family member was with her today to note our conversation to help relay things to her at home. She also has macular degeneration and has difficulty with her vision which makes this doubly unfortunate. With regard to the wounds currently the patient initially had an Unna boot with alginate placed over the wound areas. After seeing  her primary care provider they recommended Xeroform with just wrapping over the wounds themselves not a full compression wrap. Subsequently the patient was also placed on 2 rounds of doxycycline by her primary care provider. She had ABIs which were performed on 10/19/2018 and revealed that she does have normal ABIs with a right ABI of 0.96 with a TBI of 0.64 and a left ABI of 1.19 with a TBI of 0.52. She does have some discomfort though not too significant at this point with cleaning over the wound area. With that being said I think that she is going to require some sharp debridement at 1 of the wound locations if she is able to tolerate it. 11/07/2018 on evaluation today patient seems to be making good progress in regard to her wounds on the left lower extremity. She does have what appears to be a slight skin tear on the right lower extremity posteriorly but this is very small at this point. I do not think is going to take too much to get this to heal and affect the skin is already intact and present at this point. For that reason I believe that it is very likely that we will be able to get this to heal up without any additional complications or problems. 11/14/2018 on evaluation today patient's right lower extremity appears to be completely healed which is great news. On the left lower extremity the medial ulcer is showing signs of improvement as far as the Iodoflex helping to clean up the surface of the wound. The lateral wound is not really showing much signs of improvement nor worsening it just is at this point. Nonetheless I think she may do better with switching for the lateral wound to a dressing with collagen. 11/21/2018 on evaluation today patient appears to be doing decently well at this time 11/21/2018 on evaluation today patient actually appears to be doing better with regard to her lower extremity ulcers. The lateral ulcer actually  shown signs of almost complete epithelialization which is  excellent news. We have been using collagen at this site. The medial ulcer were still using Iodoflex on since that seems to be doing well to clean up the wound bed and overall things are doing quite well. She is not quite ready for the collagen as of yet. 11/28/2018 on evaluation today patient appears to be doing well with regard to her left medial lower extremity ulcer. She has been tolerating the dressing change without complication. With that being said I am very pleased with how things appear today. She does seem to have some issues with discomfort mainly when I am cleaning the area although I do not think she is getting need the Iodoflex any longer. I think we can just switch to a collagen based dressing after today. Her wound did require some sharp debridement. 12/05/2018 on evaluation today patient actually appears to be doing quite well with regard to her lower extremity ulcer. She has been tolerating the dressing changes without complication. Fortunately she is actually showing signs of improvement which is good news. The wound is measuring significantly smaller. 12/19/2018 upon evaluation today patient actually appears to be doing better with regard to her lower extremity ulcer. Fortunately this is showing signs of being slightly smaller it is looking much more healthy and overall I am very pleased with how things are progressing. No fevers, chills, nausea, vomiting, or diarrhea. Readmission: 05/01/2019 patient presents today for readmission she actually was seen last on October 14 by myself. Unfortunately following that time she had a fall where she sustained a hip fracture and has not been back till now. Upon arrival today she had Unna boots on bilaterally but to be honest it actually appears that these are completely healed as far as the left lower extremity ulcers she has nothing remaining open at this point her legs appear to be doing excellent. There is no signs of active infection at  this time which is great news. Her dementia does still seem to be quite significant. Fortunately her swelling is well controlled with the current Unna boot wraps. Electronic Signature(s) Signed: 05/01/2019 2:41:48 PM By: Worthy Keeler PA-C Entered By: Worthy Keeler on 05/01/2019 14:41:48 -------------------------------------------------------------------------------- Physical Exam Details Patient Name: Date of Service: Jasmine Jones, Jasmine Jones 05/01/2019 1:15 PM Medical Record Number:[id number] Patient Account Number: [id number] Date of Birth/Sex: Treating RN: 1925/10/18 (86 y.o. Elam Dutch Primary Care Provider: Sherrie Mustache Other Clinician: Referring Provider: Treating Provider/Extender:[name], Mardi Mainland, Loni Muse in Treatment: 0 Constitutional Well-nourished and well-hydrated in no acute distress. Respiratory normal breathing without difficulty. Psychiatric this patient is able to make decisions and demonstrates good insight into disease process. Alert and Oriented x 3. pleasant and cooperative. Notes Patient's lower extremities bilaterally appear to be completely healed there is no signs at this point of any significant infection or open wound her edema is well controlled overall I am very pleased with what I see. Electronic Signature(s) Signed: 05/01/2019 2:42:05 PM By: Worthy Keeler PA-C Entered By: Worthy Keeler on 05/01/2019 14:42:05 -------------------------------------------------------------------------------- Physician Orders Details Patient Name: Date of Service: Jasmine Jones, Jasmine Jones 05/01/2019 1:15 PM Medical Record [id number] Patient Account Number: [id number] Date of Birth/Sex: Treating RN: [birth year] (86 y.o. Elam Dutch Primary Care Provider: Sherrie Mustache Other Clinician: Referring Provider: Treating Provider/Extender:[name], Mardi Mainland, Loni Muse in Treatment: 0 Verbal / Phone Orders: No Diagnosis  Coding ICD-10 Coding Code Description I89.0 Lymphedema, not elsewhere classified I87.2 Venous insufficiency (chronic) (peripheral) I10  Essential (primary) hypertension F01.50 Vascular dementia without behavioral disturbance Discharge From [facility] Services Discharge from [place] Moisturizing lotion - both legs daily Edema Control Avoid standing for long periods of time Elevate legs to the level of the heart or above for 30 minutes daily and/or when sitting, a frequency of: - whenever sitting throughout the day Support Garment 20-30 mm/Hg pressure to: - both legs daily [place] - discontinue wound care Electronic Signature(s) Signed: 05/01/2019 5:55:18 PM By: Worthy Keeler PA-C Signed: 05/01/2019 6:08:21 PM By: Baruch Gouty RN, BSN Entered By: Baruch Gouty on 05/01/2019 14:35:41 -------------------------------------------------------------------------------- Problem List Details Patient Name: Date of Service: Jasmine Jones, Jasmine Jones 05/01/2019 1:15 PM Medical Record [id number] Patient Account Number: [id number] Date of Birth/Sex: Treating RN: 1925-08-20 (86 y.o. Elam Dutch Primary Care Provider: Sherrie Mustache Other Clinician: Referring Provider: Treating Provider/Extender:[name], Mardi Mainland, Loni Muse in Treatment: 0 Active Problems ICD-10 Evaluated Encounter Code Description Active Date Today Diagnosis I89.0 Lymphedema, not elsewhere classified 05/01/2019 No Yes I87.2 Venous insufficiency (chronic) (peripheral) 05/01/2019 No Yes I10 Essential (primary) hypertension 05/01/2019 No Yes F01.50 Vascular dementia without behavioral disturbance 05/01/2019 No Yes Inactive Problems Resolved Problems Electronic Signature(s) Signed: 05/01/2019 2:22:27 PM By: Worthy Keeler PA-C Entered By: Worthy Keeler on 05/01/2019  14:22:26 -------------------------------------------------------------------------------- Progress Note Details Patient Name: Jasmine Jones Date of Service: 05/01/2019 1:15 PM Medical Record [id number] Patient Account Number: [id number] Date of Birth/Sex: Treating RN: 02/09/1926 (86 y.o. Elam Dutch Primary Care Provider: Sherrie Mustache Other Clinician: Referring Provider: Treating Provider/Extender:[name], Mardi Mainland, Loni Muse in Treatment: 0 Subjective Chief Complaint Information obtained from Patient Bilateral LE Lymphedema History of Present Illness (HPI) 10/31/2018 on evaluation today patient presents for initial evaluation in our clinic concerning an issue that began 1 month ago. She has wounds over the left lower extremity which unfortunately have been giving her trouble. She does have a history of lymphedema as well as venous insufficiency. The patient also has hypertension and dementia. Incidentally she is also very hard of hearing and fortunately her family member was with her today to note our conversation to help relay things to her at home. She also has macular degeneration and has difficulty with her vision which makes this doubly unfortunate. With regard to the wounds currently the patient initially had an Unna boot with alginate placed over the wound areas. After seeing her primary care provider they recommended Xeroform with just wrapping over the wounds themselves not a full compression wrap. Subsequently the patient was also placed on 2 rounds of doxycycline by her primary care provider. She had ABIs which were performed on 10/19/2018 and revealed that she does have normal ABIs with a right ABI of 0.96 with a TBI of 0.64 and a left ABI of 1.19 with a TBI of 0.52. She does have some discomfort though not too significant at this point with cleaning over the wound area. With that being said I think that she is going to require some sharp  debridement at 1 of the wound locations if she is able to tolerate it. 11/07/2018 on evaluation today patient seems to be making good progress in regard to her wounds on the left lower extremity. She does have what appears to be a slight skin tear on the right lower extremity posteriorly but this is very small at this point. I do not think is going to take too much to get this to heal and affect the skin  is already intact and present at this point. For that reason I believe that it is very likely that we will be able to get this to heal up without any additional complications or problems. 11/14/2018 on evaluation today patient's right lower extremity appears to be completely healed which is great news. On the left lower extremity the medial ulcer is showing signs of improvement as far as the Iodoflex helping to clean up the surface of the wound. The lateral wound is not really showing much signs of improvement nor worsening it just is at this point. Nonetheless I think she may do better with switching for the lateral wound to a dressing with collagen. 11/21/2018 on evaluation today patient appears to be doing decently well at this time 11/21/2018 on evaluation today patient actually appears to be doing better with regard to her lower extremity ulcers. The lateral ulcer actually shown signs of almost complete epithelialization which is excellent news. We have been using collagen at this site. The medial ulcer were still using Iodoflex on since that seems to be doing well to clean up the wound bed and overall things are doing quite well. She is not quite ready for the collagen as of yet. 11/28/2018 on evaluation today patient appears to be doing well with regard to her left medial lower extremity ulcer. She has been tolerating the dressing change without complication. With that being said I am very pleased with how things appear today. She does seem to have some issues with discomfort mainly when I am  cleaning the area although I do not think she is getting need the Iodoflex any longer. I think we can just switch to a collagen based dressing after today. Her wound did require some sharp debridement. 12/05/2018 on evaluation today patient actually appears to be doing quite well with regard to her lower extremity ulcer. She has been tolerating the dressing changes without complication. Fortunately she is actually showing signs of improvement which is good news. The wound is measuring significantly smaller. 12/19/2018 upon evaluation today patient actually appears to be doing better with regard to her lower extremity ulcer. Fortunately this is showing signs of being slightly smaller it is looking much more healthy and overall I am very pleased with how things are progressing. No fevers, chills, nausea, vomiting, or diarrhea. Readmission: 05/01/2019 patient presents today for readmission she actually was seen last on October 14 by myself. Unfortunately following that time she had a fall where she sustained a hip fracture and has not been back till now. Upon arrival today she had Unna boots on bilaterally but to be honest it actually appears that these are completely healed as far as the left lower extremity ulcers she has nothing remaining open at this point her legs appear to be doing excellent. There is no signs of active infection at this time which is great news. Her dementia does still seem to be quite significant. Fortunately her swelling is well controlled with the current Unna boot wraps. Patient History Information obtained from Patient. Allergies No Known Allergies Family History Heart Disease - Mother,Father, No family history of Cancer, Diabetes, Hereditary Spherocytosis, Hypertension, Kidney Disease, Lung Disease, Seizures, Stroke, Thyroid Problems, Tuberculosis. Social History Never smoker, Marital Status - Widowed, Alcohol Use - Never, Drug Use - No History, Caffeine Use -  Daily. Medical History Eyes Denies history of Cataracts, Glaucoma, Optic Neuritis Ear/Nose/Mouth/Throat Denies history of Chronic sinus problems/congestion, Middle ear problems Hematologic/Lymphatic Patient has history of Anemia Denies history of Hemophilia,  Human Immunodeficiency Virus, Lymphedema, Sickle Cell Disease Respiratory Denies history of Aspiration, Asthma, Chronic Obstructive Pulmonary Disease (COPD), Pneumothorax, Sleep Apnea, Tuberculosis Cardiovascular Patient has history of Hypertension Denies history of Angina, Arrhythmia, Congestive Heart Failure, Coronary Artery Disease, Deep Vein Thrombosis, Hypotension, Myocardial Infarction, Peripheral Arterial Disease, Peripheral Venous Disease, Phlebitis, Vasculitis Gastrointestinal Denies history of Cirrhosis , Colitis, Crohnoos, Hepatitis A, Hepatitis B, Hepatitis C Endocrine Denies history of Type I Diabetes, Type II Diabetes Genitourinary Denies history of End Stage Renal Disease Immunological Denies history of Lupus Erythematosus, Raynaudoos, Scleroderma Integumentary (Skin) Denies history of History of Burn Musculoskeletal Denies history of Gout, Rheumatoid Arthritis, Osteoarthritis, Osteomyelitis Neurologic Patient has history of Dementia Denies history of Neuropathy, Quadriplegia, Paraplegia, Seizure Disorder Oncologic Denies history of Received Chemotherapy, Received Radiation Psychiatric Denies history of Anorexia/bulimia, Confinement Anxiety Medical And Surgical History Notes Eyes Macular degeneration Review of Systems (ROS) Constitutional Symptoms (General Health) Denies complaints or symptoms of Fatigue, Fever, Chills, Marked Weight Change. Eyes Denies complaints or symptoms of Dry Eyes, Vision Changes, Glasses / Contacts. Ear/Nose/Mouth/Throat Denies complaints or symptoms of Chronic sinus problems or rhinitis. Respiratory Denies complaints or symptoms of Chronic or frequent coughs, Shortness of  Breath. Gastrointestinal Denies complaints or symptoms of Frequent diarrhea, Nausea, Vomiting. Endocrine Denies complaints or symptoms of Heat/cold intolerance. Integumentary (Skin) Denies complaints or symptoms of Wounds. Musculoskeletal Denies complaints or symptoms of Muscle Pain, Muscle Weakness. Psychiatric Denies complaints or symptoms of Claustrophobia, Suicidal. Objective Constitutional Well-nourished and well-hydrated in no acute distress. Vitals Time Taken: 1:39 PM, Temperature: 98.0 F, Pulse: 71 bpm, Respiratory Rate: 18 breaths/min, Blood Pressure: 137/80 mmHg. Respiratory normal breathing without difficulty. Psychiatric this patient is able to make decisions and demonstrates good insight into disease process. Alert and Oriented x 3. pleasant and cooperative. General Notes: Patient's lower extremities bilaterally appear to be completely healed there is no signs at this point of any significant infection or open wound her edema is well controlled overall I am very pleased with what I see. Assessment Active Problems ICD-10 Lymphedema, not elsewhere classified Venous insufficiency (chronic) (peripheral) Essential (primary) hypertension Vascular dementia without behavioral disturbance Plan [facility]charge [facility]: Discharge from [place] Skin Barriers/Peri-Wound Care: Moisturizing lotion - both legs daily Edema Control: Avoid standing for long periods of time Elevate legs to the level of the heart or above for 30 minutes daily and/or when sitting, a frequency of: - whenever sitting throughout the day Support Garment 20-30 mm/Hg pressure to: - both legs daily Home Health: [place] - discontinue wound care 1. Patient does not appear to have any open wounds at this time which is excellent news. With that being said she is going to require some kind of ongoing compression to keep her edema under good control. I really think  that compression stockings would be fine potentially she could also benefit from Velcro compression wraps depending on what her daughter feels would be best. 2. I am also going suggest she continue to elevate her legs is much as possible the patient actually interrupted me at 1 point and mentioned that she knew she needed to do that which is excellent that she is aware of this I also told her that she needs to leave the stockings on and not mess with those we do not want her to cause any problems with her legs and potential wounds. We will see the patient back for follow-up visit as needed. Electronic Signature(s) Signed: 05/01/2019 2:43:07 PM By: Worthy Keeler  PA-C Entered By: Worthy Keeler on 05/01/2019 14:43:06 -------------------------------------------------------------------------------- HxROS Details Patient Name: Date of Service: Jasmine Jones, Jasmine Jones 05/01/2019 1:15 PM Medical Record Number:[id number] Patient Account Number: [id number] Date of Birth/Sex: Treating RN: 03-31-1925 (86 y.o. Nancy Fetter Primary Care Provider: Sherrie Mustache Other Clinician: Referring Provider: Treating Provider/Extender:[name], Mardi Mainland, Loni Muse in Treatment: 0 Information Obtained From Patient Constitutional Symptoms (General Health) Complaints and Symptoms: Negative for: Fatigue; Fever; Chills; Marked Weight Change Eyes Complaints and Symptoms: Negative for: Dry Eyes; Vision Changes; Glasses / Contacts Medical History: Negative for: Cataracts; Glaucoma; Optic Neuritis Past Medical History Notes: Macular degeneration Ear/Nose/Mouth/Throat Complaints and Symptoms: Negative for: Chronic sinus problems or rhinitis Medical History: Negative for: Chronic sinus problems/congestion; Middle ear problems Respiratory Complaints and Symptoms: Negative for: Chronic or frequent coughs; Shortness of Breath Medical History: Negative for: Aspiration; Asthma; Chronic Obstructive  Pulmonary Disease (COPD); Pneumothorax; Sleep Apnea; Tuberculosis Gastrointestinal Complaints and Symptoms: Negative for: Frequent diarrhea; Nausea; Vomiting Medical History: Negative for: Cirrhosis ; Colitis; Crohns; Hepatitis A; Hepatitis B; Hepatitis C Endocrine Complaints and Symptoms: Negative for: Heat/cold intolerance Medical History: Negative for: Type I Diabetes; Type II Diabetes Integumentary (Skin) Complaints and Symptoms: Negative for: Wounds Medical History: Negative for: History of Burn Musculoskeletal Complaints and Symptoms: Negative for: Muscle Pain; Muscle Weakness Medical History: Negative for: Gout; Rheumatoid Arthritis; Osteoarthritis; Osteomyelitis Psychiatric Complaints and Symptoms: Negative for: Claustrophobia; Suicidal Medical History: Negative for: Anorexia/bulimia; Confinement Anxiety Hematologic/Lymphatic Medical History: Positive for: Anemia Negative for: Hemophilia; Human Immunodeficiency Virus; Lymphedema; Sickle Cell Disease Cardiovascular Medical History: Positive for: Hypertension Negative for: Angina; Arrhythmia; Congestive Heart Failure; Coronary Artery Disease; Deep Vein Thrombosis; Hypotension; Myocardial Infarction; Peripheral Arterial Disease; Peripheral Venous Disease; Phlebitis; Vasculitis Genitourinary Medical History: Negative for: End Stage Renal Disease Immunological Medical History: Negative for: Lupus Erythematosus; Raynauds; Scleroderma Neurologic Medical History: Positive for: Dementia Negative for: Neuropathy; Quadriplegia; Paraplegia; Seizure Disorder Oncologic Medical History: Negative for: Received Chemotherapy; Received Radiation Immunizations Pneumococcal Vaccine: Received Pneumococcal Vaccination: No Implantable Devices None Family and Social History Cancer: No; Diabetes: No; Heart Disease: Yes - Mother,Father; Hereditary Spherocytosis: No; Hypertension: No; Kidney Disease: No; Lung Disease: No;  Seizures: No; Stroke: No; Thyroid Problems: No; Tuberculosis: No; Never smoker; Marital Status - Widowed; Alcohol Use: Never; Drug Use: No History; Caffeine Use: Daily; Financial Concerns: No; Food, Clothing or Shelter Needs: No; Support System Lacking: No; Transportation Concerns: No Electronic Signature(s) Signed: 05/01/2019 5:55:18 PM By: Worthy Keeler PA-C Signed: 05/02/2019 8:56:01 AM By: Levan Hurst RN, BSN Entered By: Levan Hurst on 05/01/2019 13:47:21 -------------------------------------------------------------------------------- [place] Details Patient Name: Date of Service: Jasmine Jones, Jasmine Jones 05/01/2019 Medical Record [id number] Patient Account Number: [id number] Date of Birth/Sex: Treating RN: 11-22-1925 (86 y.o. Elam Dutch Primary Care Provider: Sherrie Mustache Other Clinician: Referring Provider: Treating Provider/Extender:[name], Mardi Mainland, Loni Muse in Treatment: 0 Diagnosis Coding ICD-10 Codes Code Description I89.0 Lymphedema, not elsewhere classified I87.2 Venous insufficiency (chronic) (peripheral) I10 Essential (primary) hypertension F01.50 Vascular dementia without behavioral disturbance Facility Procedures CPT4 Code: [id number] Description: 99213 - WOUND CARE VISIT-LEV 3 EST PT Modifier: Quantity: 1 Physician Procedures CPT4 Code: [id number] Description: [id number] - WC PHYS LEVEL 3 - EST PT ICD-10 Diagnosis Description I89.0 Lymphedema, not elsewhere classified I87.2 Venous insufficiency (chronic) (peripheral) I10 Essential (primary) hypertension F01.50 Vascular dementia without behavioral  disturbanc Modifier: e Quantity: 1 Electronic Signature(s) Signed: 05/01/2019 2:43:21 PM By: Worthy Keeler PA-C Entered By: Worthy Keeler on 05/01/2019 14:43:21

## 2019-05-02 NOTE — Progress Notes (Signed)
Jasmine Jones, Jasmine Jones ([id number]) Visit Report for 05/01/2019 Allergy List Details Patient Name: Date of Service: Jasmine Jones, Jasmine Jones 05/01/2019 1:15 PM Medical Record [id number] Patient Account Number: [id number] Date of Birth/Sex: Treating RN: Jun 09, 1925 (86 y.o. Nancy Fetter Primary Care Abeera Flannery: Sherrie Mustache Other Clinician: Referring Siddalee Vanderheiden: Treating Stan Cantave/Extender:[name], Mardi Mainland, Loni Muse in Treatment: 0 Allergies Active Allergies No Known Allergies Allergy Notes Electronic Signature(s) Signed: 05/02/2019 8:56:01 AM By: Levan Hurst RN, BSN Entered By: Levan Hurst on 05/01/2019 13:44:53 -------------------------------------------------------------------------------- Arrival Information Details Patient Name: Date of Service: Jasmine Jones, Jasmine Jones 05/01/2019 1:15 PM Medical Record [id number] Patient Account Number: [id number] Date of Birth/Sex: Treating RN: Apr 28, 1925 (86 y.o. Nancy Fetter Primary Care Ocie Tino: Sherrie Mustache Other Clinician: Referring Nikitas Davtyan: Treating Zareya Tuckett/Extender:[name], Mardi Mainland, Loni Muse in Treatment: 0 Visit Information Patient Arrived: Wheel Chair Arrival Time: 13:38 Accompanied By: caregiver Transfer Assistance: Manual Patient Identification Verified: Yes Secondary Verification Process Completed: Yes Patient Requires Transmission-Based No Precautions: Patient Has Alerts: No Electronic Signature(s) Signed: 05/02/2019 8:56:01 AM By: Levan Hurst RN, BSN Entered By: Cindie Laroche History Since Last Visit Added or deleted any medications: No Any new allergies or adverse reactions: No Had a fall or experienced change in activities of daily living that may affect risk of falls: No Signs or symptoms of abuse/neglect since last visito No Hospitalized since last visit: No Implantable device outside of the clinic excluding cellular tissue based products placed in the center since  last visit: No Pain Present Now: No -------------------------------------------------------------------------------- Clinic Level of Care Assessment Details Patient Name: Date of Service: Jasmine Jones, Jasmine Jones 05/01/2019 1:15 PM Medical Record Number:[id number] Patient Account Number: [id number] Date of Birth/Sex: Treating RN: 31-Aug-1925 (86 y.o. Elam Dutch Primary Care Emmanuela Ghazi: Sherrie Mustache Other Clinician: Referring Aundraya Dripps: Treating Nevaan Bunton/Extender:[name], Mardi Mainland, Loni Muse in Treatment: 0 Clinic Level of Care Assessment Items TOOL 2 Quantity Score []  - Use when only an EandM is performed on the INITIAL visit 0 ASSESSMENTS - Nursing Assessment / Reassessment X - General Physical Exam (combine w/ comprehensive assessment (listed just below) 1 20 when performed on new pt. evals) X - Comprehensive Assessment (HX, ROS, Risk Assessments, Wounds Hx, etc.) 1 25 ASSESSMENTS - Wound and Skin Assessment / Reassessment []  - Simple Wound Assessment / Reassessment - one wound 0 []  - Complex Wound Assessment / Reassessment - multiple wounds 0 X - Dermatologic / Skin Assessment (not related to wound area) 1 10 ASSESSMENTS - Ostomy and/or Continence Assessment and Care []  - Incontinence Assessment and Management 0 []  - Ostomy Care Assessment and Management (repouching, etc.) 0 PROCESS - Coordination of Care X - Simple Patient / Family Education for ongoing care 1 15 []  - Complex (extensive) Patient / Family Education for ongoing care 0 X - Staff obtains Programmer, systems, Records, Test Results / Process Orders 1 10 X - Staff telephones HHA, Nursing Homes / Clarify orders / etc 1 10 []  - Routine Transfer to another Facility (non-emergent condition) 0 []  - Routine Hospital Admission (non-emergent condition) 0 X - New Admissions / Biomedical engineer / Ordering NPWT, Apligraf, etc. 1 15 []  - Emergency Hospital Admission (emergent condition) 0 X - Simple Discharge  Coordination 1 10 []  - Complex (extensive) Discharge Coordination 0 PROCESS - Special Needs []  - Pediatric / Minor Patient Management 0 []  - Isolation Patient Management 0 []  - Hearing / Language / Visual special needs 0 []  - Assessment of Community assistance (transportation, D/C planning, etc.) 0 []  - Additional assistance / Altered mentation 0 []  -  Support Surface(s) Assessment (bed, cushion, seat, etc.) 0 INTERVENTIONS - Wound Cleansing / Measurement []  - Wound Imaging (photographs - any number of wounds) 0 []  - Wound Tracing (instead of photographs) 0 []  - Simple Wound Measurement - one wound 0 []  - Complex Wound Measurement - multiple wounds 0 []  - Simple Wound Cleansing - one wound 0 []  - Complex Wound Cleansing - multiple wounds 0 INTERVENTIONS - Wound Dressings []  - Small Wound Dressing one or multiple wounds 0 []  - Medium Wound Dressing one or multiple wounds 0 []  - Large Wound Dressing one or multiple wounds 0 []  - Application of Medications - injection 0 INTERVENTIONS - Miscellaneous []  - External ear exam 0 []  - Specimen Collection (cultures, biopsies, blood, body fluids, etc.) 0 []  - Specimen(s) / Culture(s) sent or taken to Lab for analysis 0 []  - Patient Transfer (multiple staff / Civil Service fast streamer / Similar devices) 0 []  - Simple Staple / Suture removal (25 or less) 0 []  - Complex Staple / Suture removal (26 or more) 0 []  - Hypo / Hyperglycemic Management (close monitor of Blood Glucose) 0 []  - Ankle / Brachial Index (ABI) - do not check if billed separately 0 Has the patient been seen at the hospital within the last three years: Yes Total Score: 115 Level Of Care: New/Established - Level 3 Electronic Signature(s) Signed: 05/01/2019 6:08:21 PM By: Baruch Gouty RN, BSN Entered By: Baruch Gouty on 05/01/2019 14:31:56 -------------------------------------------------------------------------------- Lower Extremity Assessment Details Patient Name: Date of  Service: Jasmine Jones, Jasmine Jones 05/01/2019 1:15 PM Medical Record [id number] Patient Account Number: [id number] Date of Birth/Sex: Treating RN: [birth year] (86 y.o. Nancy Fetter Primary Care Nguyen Todorov: Sherrie Mustache Other Clinician: Referring Taedyn Glasscock: Treating Debbrah Sampedro/Extender:[name], Mardi Mainland, Loni Muse in Treatment: 0 Edema Assessment Assessed: [Left: No] [Right: No] Edema: [Left: No] [Right: No] V[Left: ascular Assessmen] [Right: t] Left: [Left: Right] [Right: :] P[Left: ulses] [Right: :] [Left: Dorsalis Pedi] [Right: s] Palpable: [Left: Yes] [Right: Yes] Electronic Signature(s) Signed: 05/02/2019 8:56:01 AM By: Levan Hurst RN, BSN Entered By: Levan Hurst on 05/01/2019 13:49:33 -------------------------------------------------------------------------------- Pain Assessment Details Patient Name: Date of Service: Jasmine Jones, Jasmine Jones 05/01/2019 1:15 PM Medical Record [id number] Patient Account Number: [id number] Date of Birth/Sex: Treating RN: Jul 15, 1925 (86 y.o. Nancy Fetter Primary Care Shavaughn Seidl: Sherrie Mustache Other Clinician: Referring Percy Winterrowd: Treating Raeanne Deschler/Extender:[name], Mardi Mainland, Loni Muse in Treatment: 0 Active Problems Location of Pain Severity and Description of Pain Patient Has Paino No Patient Has Paino No Site Locations Pain Management and Medication Current Pain Management: Electronic Signature(s) Signed: 05/02/2019 8:56:01 AM By: Levan Hurst RN, BSN Entered By: Levan Hurst on 05/01/2019 13:49:41 -------------------------------------------------------------------------------- Patient/Caregiver Education Details Dewey, 2/24/2021andnbsp1:15 Patient Name: Date of Service: [place] PM Medical Record Patient Account Number: [id number] Number: Treating RN: Baruch Gouty Date of Birth/Gender: 1925-05-30 (86 y.o. F) Other Clinician: Primary Care Treating Eubanks, Vanita Ingles Physician: Physician/Extender: Referring Physician: Charlann Lange in Treatment: 0 Education Assessment Education Provided To: Patient Education Topics Provided Venous: Methods: Explain/Verbal Responses: Reinforcements needed, State content correctly Wound/Skin Impairment: Methods: Explain/Verbal Responses: Reinforcements needed, State content correctly Electronic Signature(s) Signed: 05/01/2019 6:08:21 PM By: Baruch Gouty RN, BSN Entered By: Baruch Gouty on 05/01/2019 14:30:36 -------------------------------------------------------------------------------- Pease Details Patient Name: Date of Service: Jasmine Jones, Jasmine Jones 05/01/2019 1:15 PM Medical Record [id number] Patient Account Number: [id number] Date of Birth/Sex: Treating RN: 12-06-1925 (86 y.o. Nancy Fetter Primary Care Adelbert Gaspard: Sherrie Mustache Other Clinician: Referring Valdemar Mcclenahan: Treating Ellarose Brandi/Extender:[name], Mardi Mainland, Loni Muse in Treatment: 0  Vital Signs Time Taken: 13:39 Temperature (F): 98.0 Pulse (bpm): 71 Respiratory Rate (breaths/min): 18 Blood Pressure (mmHg): 137/80 Reference Range: 80 - 120 mg / dl Electronic Signature(s) Signed: 05/02/2019 8:56:01 AM By: Levan Hurst RN, BSN Entered By: Levan Hurst on 05/01/2019 13:40:02

## 2019-05-02 NOTE — Progress Notes (Signed)
MECKENZIE, KELLOG ([id number]) Visit Report for 05/01/2019 Abuse/Suicide Risk Screen Details Patient Name: Date of Service: Jasmine Jones, Jasmine Jones 05/01/2019 1:15 PM Medical Record [id number] Patient Account Number: [id number] Date of Birth/Sex: Treating RN: 07/22/1925 (86 y.o. Nancy Fetter Primary Care Vitoria Conyer: Sherrie Mustache Other Clinician: Referring Real Cona: Treating Kipton Skillen/Extender:[name], Mardi Mainland, Loni Muse in Treatment: 0 Abuse/Suicide Risk Screen Items Answer ABUSE RISK SCREEN: Has anyone close to you tried to hurt or harm you recentlyo No Do you feel uncomfortable with anyone in your familyo No Has anyone forced you do things that you didnt want to doo No Electronic Signature(s) Signed: 05/02/2019 8:56:01 AM By: Levan Hurst RN, BSN Entered By: Levan Hurst on 05/01/2019 13:47:30 -------------------------------------------------------------------------------- Activities of Daily Living Details Patient Name: Date of Service: Jasmine Jones, Jasmine Jones 05/01/2019 1:15 PM Medical Record [place] Patient Account Number: [id number] Date of Birth/Sex: Treating RN: 1925/12/06 (86 y.o. Nancy Fetter Primary Care Solei Wubben: Sherrie Mustache Other Clinician: Referring Jorita Bohanon: Treating Sara Selvidge/Extender:[name], Mardi Mainland, Loni Muse in Treatment: 0 Activities of Daily Living Items Answer Activities of Daily Living (Please select one for each item) Drive Automobile Not Able Take Medications Need Assistance Use Telephone Need Assistance Care for Appearance Need Assistance Use Toilet Need Assistance Bath / Shower Need Assistance Dress Self Need Assistance Feed Self Need Assistance Walk Need Assistance Get In / Out Bed Need Assistance Housework Need Assistance Prepare Meals Not Able Handle Money Need Assistance Shop for Self Need Assistance Electronic Signature(s) Signed: 05/02/2019 8:56:01 AM By: Levan Hurst RN,  BSN Entered By: Levan Hurst on 05/01/2019 13:48:06 -------------------------------------------------------------------------------- Education Screening Details Patient Name: Date of Service: Jasmine Jones, Jasmine Jones 05/01/2019 1:15 PM Medical Record [id number] Patient Account Number: [id number] Date of Birth/Sex: Treating RN: 05-30-1925 (86 y.o. Nancy Fetter Primary Care Princella Jaskiewicz: Sherrie Mustache Other Clinician: Referring Vanice Rappa: Treating Aaliayah Miao/Extender:[name], Mardi Mainland, Loni Muse in Treatment: 0 Primary Learner Assessed: Caregiver Reason Patient is not Primary Learner: Dementia Learning Preferences/Education Level/Primary Language Learning Preference: Explanation, Demonstration, Printed Material Highest Education Level: High School Preferred Language: English Cognitive Barrier Language Barrier: No Translator Needed: No Memory Deficit: No Emotional Barrier: No Cultural/Religious Beliefs Affecting Medical Care: No Physical Barrier Impaired Vision: No Impaired Hearing: No Decreased Hand dexterity: No Knowledge/Comprehension Knowledge Level: High Comprehension Level: High Ability to understand written High instructions: Ability to understand verbal High instructions: Motivation Anxiety Level: Calm Cooperation: Cooperative Education Importance: Acknowledges Need Interest in Health Problems: Asks Questions Perception: Coherent Willingness to Engage in Self- High Management Activities: Readiness to Engage in Self- High Management Activities: Electronic Signature(s) Signed: 05/02/2019 8:56:01 AM By: Levan Hurst RN, BSN Entered By: Levan Hurst on 05/01/2019 13:48:42 -------------------------------------------------------------------------------- Fall Risk Assessment Details Patient Name: Date of Service: Jasmine Jones, Jasmine Jones 05/01/2019 1:15 PM Medical Record [id number] Patient Account Number: [id number] Date of  Birth/Sex: Treating RN: May 24, 1925 (86 y.o. Nancy Fetter Primary Care Sherree Shankman: Sherrie Mustache Other Clinician: Referring Koleton Duchemin: Treating Deandrea Rion/Extender:[name], Mardi Mainland, Loni Muse in Treatment: 0 Fall Risk Assessment Items Have you had 2 or more falls in the last 12 monthso 0 Yes Have you had any fall that resulted in injury in the last 12 monthso 0 Yes FALLS RISK SCREEN History of falling - immediate or within 3 months 25 Yes Secondary diagnosis (Do you have 2 or more medical diagnoseso) 0 No Ambulatory aid None/bed rest/wheelchair/nurse 0 No Crutches/cane/walker 15 Yes Furniture 0 No Intravenous therapy Access/Saline/Heparin Lock 0 No Weak (short steps with or without shuffle, stooped but able to lift head 10 Yes while walking, may  seek support from furniture) Impaired (short steps with shuffle, may have difficulty arising from chair, 0 No head down, impaired balance) Mental Status Oriented to own ability 0 No Overestimates or forgets limitations 15 Yes Risk Level: High Risk Score: 65 Electronic Signature(s) Signed: 05/02/2019 8:56:01 AM By: Levan Hurst RN, BSN Entered By: Levan Hurst on 05/01/2019 13:49:09 -------------------------------------------------------------------------------- Foot Assessment Details Patient Name: Date of Service: Jasmine Jones, Jasmine Jones 05/01/2019 1:15 PM Medical Record [id number] Patient Account Number: [id number] Date of Birth/Sex: Treating RN: 1926/01/04 (86 y.o. Nancy Fetter Primary Care Eleanor Dimichele: Sherrie Mustache Other Clinician: Referring Rayquan Amrhein: Treating Adelyn Roscher/Extender:[name], Mardi Mainland, Loni Muse in Treatment: 0 Foot Assessment Items [x]  Unable to perform due to altered mental status Site Locations + = Sensation present, - = Sensation absent, C = Callus, U = Ulcer R = Redness, W = Warmth, M = Maceration, PU = Pre-ulcerative lesion F = Fissure, S = Swelling, D =  Dryness Assessment Right: Left: Other Deformity: No No Prior Foot Ulcer: No No Prior Amputation: No No Charcot Joint: No No Ambulatory Status: Ambulatory With Help Assistance Device: Walker Gait: Administrator, arts) Signed: 05/02/2019 8:56:01 AM By: Levan Hurst RN, BSN Entered By: Levan Hurst on 05/01/2019 13:49:26 -------------------------------------------------------------------------------- Nutrition Risk Screening Details Patient Name: Date of Service: Jasmine Jones, Jasmine Jones 05/01/2019 1:15 PM Medical Record [id number] Patient Account Number: [id number] Date of Birth/Sex: Treating RN: 10-13-1925 (86 y.o. Nancy Fetter Primary Care Tymeka Privette: Sherrie Mustache Other Clinician: Referring Cleofas Hudgins: Treating Jia Mohamed/Extender:[name], Mardi Mainland, Loni Muse in Treatment: 0 Height (in): Weight (lbs): Body Mass Index (BMI): Nutrition Risk Screening Items Score Screening NUTRITION RISK SCREEN: I have an illness or condition that made me change the kind and/or 0 No amount of food I eat I eat fewer than two meals per day 0 No I eat few fruits and vegetables, or milk products 0 No I have three or more drinks of beer, liquor or wine almost every day 0 No I have tooth or mouth problems that make it hard for me to eat 0 No I don't always have enough money to buy the food I need 0 No I eat alone most of the time 0 No I take three or more different prescribed or over-the-counter drugs a day 1 Yes 0 No Without wanting to, I have lost or gained 10 pounds in the last six months I am not always physically able to shop, cook and/or feed myself 2 Yes Nutrition Protocols Good Risk Protocol Provide education on Moderate Risk Protocol 0 nutrition High Risk Proctocol Risk Level: Moderate Risk Score: 3 Electronic Signature(s) Signed: 05/02/2019 8:56:01 AM By: Levan Hurst RN, BSN Entered By: Levan Hurst on 05/01/2019 13:49:17

## 2019-05-02 NOTE — Telephone Encounter (Signed)
Patient is getting tramadol for pain.  Family had been contacted by Hospice and appointment scheduled, but no visit yet.

## 2019-05-03 ENCOUNTER — Encounter: Payer: Medicare Other | Admitting: Internal Medicine

## 2019-05-06 ENCOUNTER — Other Ambulatory Visit: Payer: Self-pay

## 2019-05-08 ENCOUNTER — Other Ambulatory Visit: Payer: Self-pay | Admitting: *Deleted

## 2019-05-08 MED ORDER — FAMOTIDINE 20 MG PO TABS: 20.0000 mg | ORAL_TABLET | Freq: Every day | ORAL | 1 refills | Status: DC

## 2019-05-08 NOTE — Telephone Encounter (Signed)
Received fax from [facility] requesting Hard Script for Famotidine.  Printed and placed in [place] folder to review and sign. To be faxed back to [employer] Fax: [phone]

## 2019-06-14 ENCOUNTER — Other Ambulatory Visit: Payer: Self-pay | Admitting: *Deleted

## 2019-06-14 MED ORDER — BUSPIRONE HCL 10 MG PO TABS: 10.0000 mg | ORAL_TABLET | Freq: Three times a day (TID) | ORAL | 1 refills | Status: DC

## 2019-06-14 NOTE — Telephone Encounter (Signed)
Received fax from [facility] requesting a refill hard script. To be faxed back to Facility Fax: [phone] Rx printed and given to [facility] for approval.

## 2019-06-27 ENCOUNTER — Ambulatory Visit (INDEPENDENT_AMBULATORY_CARE_PROVIDER_SITE_OTHER): Payer: Medicare Other | Admitting: Nurse Practitioner

## 2019-06-27 ENCOUNTER — Encounter: Payer: Self-pay | Admitting: Nurse Practitioner

## 2019-06-27 ENCOUNTER — Other Ambulatory Visit: Payer: Self-pay

## 2019-06-27 DIAGNOSIS — R197 Diarrhea, unspecified: Secondary | ICD-10-CM | POA: Diagnosis not present

## 2019-06-27 NOTE — Progress Notes (Signed)
This service is provided via telemedicine  No vital signs collected/recorded due to the encounter was a telemedicine visit.   Location of patient (ex: home, work):Home.   Patient consents to a telephone visit: Yes.  Location of the provider (ex: office, home):  [facility].  Name of any referring provider:N/A  Names of all persons participating in the telemedicine service and their role in the encounter: Patient, Caregiver [name], Heriberto Antigua, RMA, Sherrie Mustache, NP.    Time spent on call: 8 minutes spend on the phone with Medical Assistant.       Careteam: Patient Care Team: [name], NP as PCP - General (Geriatric Medicine) Jolene Schimke, MD as Referring Physician (Dermatology) Birder Robson, MD as Referring Physician (Ophthalmology) Jaymes Graff, DO as Consulting Physician (Orthopedic Surgery)  Advanced Directive information Does Patient Have a Medical Advance Directive?: Yes, Type of Advance Directive: Out of facility DNR (pink MOST or yellow form);[place], Does patient want to make changes to medical advance directive?: No - Patient declined  No Known Allergies  Chief Complaint  Patient presents with  . Acute Visit    Diarrhea     HPI: Patient is a 86 y.o. female via video visit due to loose stools for 2 weeks.  Caregiver thinks she is drinking too much coffee because she is getting up at night drinking coffee at night.  This morning she went 4 times and it was pure water.  She consulted with hospice nurse- apparently she is still taking miralax and colace   The odor is really bad. No recent antibiotics.  Caregiver questions if she does not have something else going on- requesting to test stools.  No fevers or abdominal pain.   Eating well generally. No nausea   Review of Systems:  Review of Systems  Unable to perform ROS: Dementia    Past Medical History:  Diagnosis Date  . A-fib ([place])   .  Anemia   . Anxiety   . Dry skin    on ears  . Gastroesophageal reflux   . Hearing loss    significant  uses hearing aids  . High blood pressure   . Hyperlipidemia   . Loss of appetite   . Loss of smell   . Macular degeneration   . Malnutrition ([place])   . Osteoporosis   . Persistent dry cough   . Skin cancer    basal / squamous  . Slow transit constipation    abstracted from new patient packet   . Vitamin D deficiency 01/26/2018   Past Surgical History:  Procedure Laterality Date  . FEMUR IM NAIL Right 12/28/2018   Procedure: INTRAMEDULLARY (IM) NAIL FEMORAL;  Surgeon: Dorna Leitz, MD;  Location: [facility];  Service: Orthopedics;  Laterality: Right;   Social History:   reports that she has never smoked. She has never used smokeless tobacco. She reports that she does not drink alcohol or use drugs.  Family History  Problem Relation Age of Onset  . Atrial fibrillation Sister   . Diabetes Sister   . Osteoarthritis Sister   . Osteopenia Sister   . Mental retardation Sister     Medications: Patient's Medications  New Prescriptions   No medications on file  Previous Medications   ACETAMINOPHEN (TYLENOL) 325 MG TABLET    Take 650 mg by mouth daily. Mid-day and every 6 hours as needed   ALPRAZOLAM (XANAX) 0.5 MG TABLET    Take 0.5 mg  by mouth 4 (four) times daily as needed.   BUSPIRONE (BUSPAR) 10 MG TABLET    Take 1 tablet (10 mg total) by mouth 3 (three) times daily.   CALCIUM CARBONATE (OS-CAL) 600 MG TABS TABLET    Take 600 mg by mouth every 12 (twelve) hours.    CHOLECALCIFEROL (VITAMIN D) 25 MCG (1000 UT) TABLET    Take 2,000 Units by mouth daily.   DOCUSATE SODIUM (COLACE) 100 MG CAPSULE    Take 100 mg by mouth every 12 (twelve) hours as needed for mild constipation.   FAMOTIDINE (PEPCID) 20 MG TABLET    Take 1 tablet (20 mg total) by mouth at bedtime.   MEMANTINE (NAMENDA XR) 28 MG CP24 24 HR CAPSULE    Take 1 capsule (28 mg total) by mouth daily.   METOPROLOL SUCCINATE  (TOPROL-XL) 25 MG 24 HR TABLET    Take 1 tablet (25 mg total) by mouth daily.   MULTIPLE VITAMIN (DAILY VITE) TABS    Take 1 tablet by mouth daily.   MULTIPLE VITAMINS-MINERALS (EQ VISION FORMULA 50+ PO)    Take 1 tablet by mouth every 12 (twelve) hours.   NYSTATIN CREAM (MYCOSTATIN)    Apply 1 application topically 3 (three) times daily. Spread topically to sacrum and right buttocks   POLYETHYLENE GLYCOL (MIRALAX / GLYCOLAX) 17 G PACKET    Take 17 g by mouth daily.   POTASSIUM CHLORIDE (KLOR-CON) 20 MEQ TABLET    Take 1 tablet (20 mEq total) by mouth daily.   SACCHAROMYCES BOULARDII (FLORASTOR) 250 MG CAPSULE    Take 250 mg by mouth 2 (two) times daily.   SERTRALINE (ZOLOFT) 50 MG TABLET    Take 1 tablet (50 mg total) by mouth daily.   SIMVASTATIN (ZOCOR) 20 MG TABLET    Take 20 mg by mouth at bedtime.   TORSEMIDE (DEMADEX) 20 MG TABLET    Take 20 mg by mouth daily.   TRAMADOL (ULTRAM) 50 MG TABLET    Take 50 mg by mouth every 4 (four) hours as needed (while awake as needed).  Modified Medications   No medications on file  Discontinued Medications   ENSURE (ENSURE)    Take 237 mLs by mouth daily.   SIMVASTATIN (ZOCOR) 20 MG TABLET    Take 1 tablet (20 mg total) by mouth daily at 6 PM.    Physical Exam:  There were no vitals filed for this visit. There is no height or weight on file to calculate BMI. Wt Readings from Last 3 Encounters:  04/29/19 104 lb (47.2 kg)  04/12/19 115 lb 3.2 oz (52.3 kg)  12/28/18 109 lb 5.6 oz (49.6 kg)     Labs reviewed: Basic Metabolic Panel: Recent Labs    09/06/18 1433 09/16/18 2337 11/28/18 1211 12/27/18 2014 12/28/18 0537 12/28/18 1424 12/31/18 0527 01/01/19 0602 04/12/19 1527  NA 134*   < > 138   < > 139   < > 136 137 142  K 4.2   < > 3.8   < > 2.9*   < > 3.6 3.4* 4.0  CL 96*   < > 94*   < > 96*   < > 99 102 103  CO2 28   < > 35*   < > 32   < > 29 29 29   GLUCOSE 85   < > 76   < > 119*   < > 99 97 86  BUN 13   < > 20   < >  20   < > 12 12  17   CREATININE 0.61   < > 0.72   < > 0.57   < > 0.45 0.45 0.79  CALCIUM 9.2   < > 9.5   < > 8.9   < > 7.8* 8.0* 9.1  MG  --   --   --   --  1.8  --   --  2.0  --   TSH 0.38*  --  1.49  --   --   --   --   --   --    < > = values in this interval not displayed.   Liver Function Tests: Recent Labs    09/16/18 2337 11/28/18 1211 12/27/18 2014  AST 54* 22 51*  ALT 28 11 12   ALKPHOS 81  --  82  BILITOT 1.4* 0.8 1.9*  PROT 5.9* 6.0* 6.5  ALBUMIN 3.7  --  3.6   No results for input(s): LIPASE, AMYLASE in the last 8760 hours. No results for input(s): AMMONIA in the last 8760 hours. CBC: Recent Labs    09/16/18 2337 09/16/18 2337 12/27/18 2014 12/28/18 0035 12/31/18 0527 12/31/18 0527 12/31/18 1425 01/01/19 0602 04/12/19 1527  WBC 10.3   < > 11.7*   < > 8.6  --   --  7.9 5.4  NEUTROABS 7.8*  --  9.1*  --   --   --   --   --  3,240  HGB 12.5   < > 12.8   < > 6.8*   < > 8.2* 7.7* 11.4*  HCT 39.6   < > 40.3   < > 22.2*   < > 25.9* 24.4* 35.7  MCV 84.4   < > 84.3   < > 88.8  --   --  87.8 81.9  PLT 193   < > 213   < > 113*  --   --  124* 234   < > = values in this interval not displayed.   Lipid Panel: Recent Labs    09/06/18 1433  CHOL 116  HDL 55  LDLCALC 43  TRIG 101  CHOLHDL 2.1   TSH: Recent Labs    09/06/18 1433 11/28/18 1211  TSH 0.38* 1.49   A1C: No results found for: HGBA1C   Assessment/Plan 1. Diarrhea, unspecified type -orders written and sent to facility to change colace and miralax to PRN only for constipation -will have them send off stool sample for c diff, O&P and culture. -to notify if loose stools/diarrhea continues  -to encourage proper hydration  Jonas Goh K. Harle Battiest  Virtual Visit via Video Note  I connected with Jasmine Jones on 06/27/19 at  2:15 PM EDT by a video enabled telemedicine application and verified that I am speaking with the correct person using two identifiers.  Location: Patient: home Provider: [facility]   I discussed the limitations of evaluation and management by telemedicine and the availability of in person appointments. The patient expressed understanding and agreed to proceed.    I discussed the assessment and treatment plan with the patient. The patient was provided an opportunity to ask questions and all were answered. The patient agreed with the plan and demonstrated an understanding of the instructions.   The patient was advised to call back or seek an in-person evaluation if the symptoms worsen or if the condition fails to improve as anticipated.  I provided 12  minutes of non-face-to-face time during this encounter.  Janett Billow  Beaulah Corin, AGNP Avs printed and mailed.    [facility] [phone]

## 2019-07-01 ENCOUNTER — Other Ambulatory Visit: Payer: Self-pay | Admitting: Nurse Practitioner

## 2019-07-01 DIAGNOSIS — R197 Diarrhea, unspecified: Secondary | ICD-10-CM

## 2019-07-26 ENCOUNTER — Telehealth (INDEPENDENT_AMBULATORY_CARE_PROVIDER_SITE_OTHER): Payer: Medicare Other | Admitting: Nurse Practitioner

## 2019-07-26 ENCOUNTER — Encounter: Payer: Self-pay | Admitting: Nurse Practitioner

## 2019-07-26 ENCOUNTER — Other Ambulatory Visit: Payer: Self-pay

## 2019-07-26 ENCOUNTER — Telehealth: Payer: Self-pay

## 2019-07-26 DIAGNOSIS — I700 Atherosclerosis of aorta: Secondary | ICD-10-CM | POA: Diagnosis not present

## 2019-07-26 DIAGNOSIS — R600 Localized edema: Secondary | ICD-10-CM | POA: Diagnosis not present

## 2019-07-26 DIAGNOSIS — R197 Diarrhea, unspecified: Secondary | ICD-10-CM | POA: Diagnosis not present

## 2019-07-26 DIAGNOSIS — F419 Anxiety disorder, unspecified: Secondary | ICD-10-CM

## 2019-07-26 DIAGNOSIS — R634 Abnormal weight loss: Secondary | ICD-10-CM

## 2019-07-26 DIAGNOSIS — F0391 Unspecified dementia with behavioral disturbance: Secondary | ICD-10-CM | POA: Diagnosis not present

## 2019-07-26 DIAGNOSIS — M1990 Unspecified osteoarthritis, unspecified site: Secondary | ICD-10-CM

## 2019-07-26 NOTE — Progress Notes (Signed)
This encounter was created in error - please disregard.

## 2019-07-26 NOTE — Telephone Encounter (Signed)
   [name], demonbreun are scheduled for a virtual visit with your provider today.    Just as we do with appointments in the office, we must obtain your consent to participate.  Your consent will be active for this visit and any virtual visit you may have with one of our providers in the next 365 days.    If you have a MyChart account, I can also send a copy of this consent to you electronically.  All virtual visits are billed to your insurance company just like a traditional visit in the office.  As this is a virtual visit, video technology does not allow for your provider to perform a traditional examination.  This may limit your provider's ability to fully assess your condition.  If your provider identifies any concerns that need to be evaluated in person or the need to arrange testing such as labs, EKG, etc, we will make arrangements to do so.    Although advances in technology are sophisticated, we cannot ensure that it will always work on either your end or our end.  If the connection with a video visit is poor, we may have to switch to a telephone visit.  With either a video or telephone visit, we are not always able to ensure that we have a secure connection.   I need to obtain your verbal consent now.   Are you willing to proceed with your visit today?   Jasmine Jones has provided verbal consent on 07/26/2019 for a virtual visit (video or telephone).   [name], CMA 07/26/2019  9:02 AM   2

## 2019-07-26 NOTE — Progress Notes (Signed)
This service is provided via telemedicine  No vital signs collected/recorded due to the encounter was a telemedicine visit.   Location of patient (ex: home, work):  Home  Patient consents to a telephone visit:  Yes, see encounter dated 07/26/2019  Location of the provider (ex: office, home):  [facility]  Name of any referring provider:  N/A  Names of all persons participating in the telemedicine service and their role in the encounter:  Sherrie Mustache, Nurse Practitioner, [name], CMA, and patient.   Time spent on call:  6 minutes with medical assistant     Careteam: Patient Care Team: [name], NP as PCP - General (Geriatric Medicine) Jolene Schimke, MD as Referring Physician (Dermatology) Birder Robson, MD as Referring Physician (Ophthalmology) Jaymes Graff, DO as Consulting Physician (Orthopedic Surgery)  PLACE OF SERVICE:  [place] Directive information    No Known Allergies  Chief Complaint  Patient presents with  . Medical Management of Chronic Issues    3 month follow up  . Medication Management    medication list faxed from facility for update  . Immunizations    Per [name] patient had covid-19 vaccines, date unknown  . Best Practice Recommendations    DEXA SCAN     HPI: Patient is a 86 y.o. female for routine follow up.   Reports she can not walk- she is able to walk but does not want to walk with the walker- wants to go fast but has to use walker and this slows her down.  Overall been doing well recently  Memory continues to worsen- she has gotten her days and nights mixed up. She always talks about exercise but when she attempts to do any movement she says she is tired and needs to rest.   Hospice is now following. Nurse comes out weekly. No concerns at this time.   Weight loss- her last weight 122 lbs (last week) she has gained 2 lbs each week. She is getting up at night and "eating  breakfast" always eats a good breakfast in the morning.   LE edema- currently good, but when she is up at lot at night they get very swollen. Continues to wrap legs which has been very effective.   Bowels have been moving well. She had diarrhea but this has improved, no constipation.   Anxiety- stable, goes and comes. Not a lot and able to be redirected.   Review of Systems:  Review of Systems  Unable to perform ROS: Dementia  provided by caregiver  Past Medical History:  Diagnosis Date  . A-fib ([place])   . Anemia   . Anxiety   . Dry skin    on ears  . Gastroesophageal reflux   . Hearing loss    significant  uses hearing aids  . High blood pressure   . Hyperlipidemia   . Loss of appetite   . Loss of smell   . Macular degeneration   . Malnutrition ([place])   . Osteoporosis   . Persistent dry cough   . Skin cancer    basal / squamous  . Slow transit constipation    abstracted from new patient packet   . Vitamin D deficiency 01/26/2018   Past Surgical History:  Procedure Laterality Date  . FEMUR IM NAIL Right 12/28/2018   Procedure: INTRAMEDULLARY (IM) NAIL FEMORAL;  Surgeon: Dorna Leitz, MD;  Location: [facility];  Service: Orthopedics;  Laterality: Right;  Social History:   reports that she has never smoked. She has never used smokeless tobacco. She reports that she does not drink alcohol or use drugs.  Family History  Problem Relation Age of Onset  . Atrial fibrillation Sister   . Diabetes Sister   . Osteoarthritis Sister   . Osteopenia Sister   . Mental retardation Sister     Medications: Patient's Medications  New Prescriptions   No medications on file  Previous Medications   ACETAMINOPHEN (TYLENOL) 325 MG TABLET    Take 650 mg by mouth daily. Mid-day   ALPRAZOLAM (XANAX) 0.5 MG TABLET    Take 0.5 mg by mouth. Take 1 tablet by mouth every 6 hours as needed for anxiety   BUSPIRONE (BUSPAR) 10 MG TABLET    Take 1 tablet (10 mg total) by mouth 3 (three) times  daily.   CALCIUM CARBONATE (OS-CAL) 600 MG TABS TABLET    Take 600 mg by mouth every 12 (twelve) hours.    CHOLECALCIFEROL (VITAMIN D) 25 MCG (1000 UT) TABLET    Take 2,000 Units by mouth daily.   DOCUSATE SODIUM (COLACE) 100 MG CAPSULE    Take 100 mg by mouth. Take 1 twice a day as needed for constipation   ELASTIC BANDAGES & SUPPORTS (JOBST ACTIVE 20-30MMHG MEDIUM) MISC    by Does not apply route. Provide every morning and remove every evening   EMOLLIENT (EUCERIN) LOTION    Apply topically as needed for dry skin. Spread topically to both legs once daily   FAMOTIDINE (PEPCID) 20 MG TABLET    Take 1 tablet (20 mg total) by mouth at bedtime.   MEMANTINE (NAMENDA XR) 28 MG CP24 24 HR CAPSULE    Take 1 capsule (28 mg total) by mouth daily.   METOPROLOL SUCCINATE (TOPROL-XL) 25 MG 24 HR TABLET    Take 1 tablet (25 mg total) by mouth daily.   MULTIPLE VITAMIN (DAILY VITE) TABS    Take 1 tablet by mouth daily.   MULTIPLE VITAMINS-MINERALS (EQ VISION FORMULA 50+ PO)    Take 1 tablet by mouth every 12 (twelve) hours.   NYSTATIN CREAM (MYCOSTATIN)    Apply 1 application topically 3 (three) times daily. Spread topically to sacrum and right buttocks   POLYETHYLENE GLYCOL (MIRALAX / GLYCOLAX) 17 G PACKET    Take 17 g by mouth daily. Mix 17gm in liquid and drink by mouth once daily as needed for constipation   POTASSIUM CHLORIDE (KLOR-CON) 20 MEQ TABLET    Take 1 tablet (20 mEq total) by mouth daily.   SACCHAROMYCES BOULARDII (FLORASTOR) 250 MG CAPSULE    Take 250 mg by mouth 2 (two) times daily.   SERTRALINE (ZOLOFT) 50 MG TABLET    Take 1 tablet (50 mg total) by mouth daily.   SIMVASTATIN (ZOCOR) 20 MG TABLET    Take 20 mg by mouth at bedtime.   TORSEMIDE (DEMADEX) 20 MG TABLET    Take 20 mg by mouth daily.   TRAMADOL (ULTRAM) 50 MG TABLET    Take 50 mg by mouth every 4 (four) hours as needed (while awake as needed).  Modified Medications   No medications on file  Discontinued Medications   No  medications on file    Physical Exam:  There were no vitals filed for this visit. There is no height or weight on file to calculate BMI. Wt Readings from Last 3 Encounters:  04/29/19 104 lb (47.2 kg)  04/12/19 115 lb 3.2 oz (52.3  kg)  12/28/18 109 lb 5.6 oz (49.6 kg)      Labs reviewed: Basic Metabolic Panel: Recent Labs    09/06/18 1433 09/16/18 2337 11/28/18 1211 12/27/18 2014 12/28/18 0537 12/28/18 1424 12/31/18 0527 01/01/19 0602 04/12/19 1527  NA 134*   < > 138   < > 139   < > 136 137 142  K 4.2   < > 3.8   < > 2.9*   < > 3.6 3.4* 4.0  CL 96*   < > 94*   < > 96*   < > 99 102 103  CO2 28   < > 35*   < > 32   < > 29 29 29   GLUCOSE 85   < > 76   < > 119*   < > 99 97 86  BUN 13   < > 20   < > 20   < > 12 12 17   CREATININE 0.61   < > 0.72   < > 0.57   < > 0.45 0.45 0.79  CALCIUM 9.2   < > 9.5   < > 8.9   < > 7.8* 8.0* 9.1  MG  --   --   --   --  1.8  --   --  2.0  --   TSH 0.38*  --  1.49  --   --   --   --   --   --    < > = values in this interval not displayed.   Liver Function Tests: Recent Labs    09/16/18 2337 11/28/18 1211 12/27/18 2014  AST 54* 22 51*  ALT 28 11 12   ALKPHOS 81  --  82  BILITOT 1.4* 0.8 1.9*  PROT 5.9* 6.0* 6.5  ALBUMIN 3.7  --  3.6   No results for input(s): LIPASE, AMYLASE in the last 8760 hours. No results for input(s): AMMONIA in the last 8760 hours. CBC: Recent Labs    09/16/18 2337 09/16/18 2337 12/27/18 2014 12/28/18 0035 12/31/18 0527 12/31/18 0527 12/31/18 1425 01/01/19 0602 04/12/19 1527  WBC 10.3   < > 11.7*   < > 8.6  --   --  7.9 5.4  NEUTROABS 7.8*  --  9.1*  --   --   --   --   --  3,240  HGB 12.5   < > 12.8   < > 6.8*   < > 8.2* 7.7* 11.4*  HCT 39.6   < > 40.3   < > 22.2*   < > 25.9* 24.4* 35.7  MCV 84.4   < > 84.3   < > 88.8  --   --  87.8 81.9  PLT 193   < > 213   < > 113*  --   --  124* 234   < > = values in this interval not displayed.   Lipid Panel: Recent Labs    09/06/18 1433  CHOL 116  HDL  55  LDLCALC 43  TRIG 101  CHOLHDL 2.1   TSH: Recent Labs    09/06/18 1433 11/28/18 1211  TSH 0.38* 1.49   A1C: No results found for: HGBA1C   Assessment/Plan 1. Diarrhea, unspecified type -has resolved at this time. Continues on current regimen.  2. Dementia with behavioral disturbance, unspecified dementia type ([place]) Ongoing progressive memory loss, hospice now following due to progressive decline. Continues at AL with caregiver during the day. Continues on namenda   3. Aortic  atherosclerosis ([place]) Noted on imagining. Continues on simvastatin  4. Bilateral lower extremity edema Stable, on and off, worse when she is up during the night but elevation of LE and wraps help. Continue on toresemide 20 mg daily  5. Osteoarthritis, unspecified osteoarthritis type, unspecified site -stable, without increase in pain. Continue on tramadol as needed  6. Anxiety Stable, continues on zoloft, buspar and xanax as needed. Sometimes with acute anxiety but able to be redirected  7. Weight loss Stable, continues to eat well, recently with weight gain.   Next appt: 6 months.  Carlos American. Harle Battiest  [facility] [phone]   Virtual Visit via Video Note  I connected with Adrain Zuno on 07/26/19 at  3:15 PM EDT by a video enabled telemedicine application and verified that I am speaking with the correct person using two identifiers.  Location: Patient: home Provider: office   I discussed the limitations of evaluation and management by telemedicine and the availability of in person appointments. The patient expressed understanding and agreed to proceed.    I discussed the assessment and treatment plan with the patient. The patient was provided an opportunity to ask questions and all were answered. The patient agreed with the plan and demonstrated an understanding of the instructions.   The patient was advised to call back or seek an in-person  evaluation if the symptoms worsen or if the condition fails to improve as anticipated.  I provided 26 minutes of non-face-to-face time during this encounter.  Carlos American. Dewaine Oats, AGNP Avs printed and mailed.

## 2019-11-07 ENCOUNTER — Telehealth: Payer: Self-pay

## 2019-11-07 NOTE — Telephone Encounter (Signed)
Formed reviewed and signed to be faxed to facility.May change miralax and colace to as needed for constipation. Facility also indicated patient's DOB as 12/07/1916 but on chart review patient's DOB is [id number].please verify DOB with POA.

## 2019-11-07 NOTE — Telephone Encounter (Signed)
Jasmine Jones aware form is complete and DOB was verified

## 2019-11-07 NOTE — Telephone Encounter (Signed)
Incoming call received from patients relative [name] to confirm fax received from facility regarding medication orders.  I reviewed [name]'s review & sign folder and form was received. Form was placed in Urgent folder as [name] indicated the form needs to be signed and submitted sooner than later.   [name] is leaving to go out of town Friday and would like to make sure medications are correct prior to leaving.

## 2020-02-12 ENCOUNTER — Telehealth: Payer: Self-pay

## 2020-02-12 NOTE — Telephone Encounter (Signed)
Incoming fax received from [facility] [place] requesting review and signature from [name], NP of FL2 form.  Per [name], NP patient will need an appointment in order for her to review, complete, and sign form.  I called patients niece Ronnell Guadalajara was baffled that we recived a FL2 form. [name] will call the facility and see why they are requesting completion of a FL2 form,  since patient is an established resident at their facility. [name] will call back with a status update.   Awaiting return call.

## 2020-02-17 NOTE — Telephone Encounter (Signed)
Left message on voicemail for patients niece [name] to return call when available

## 2020-02-21 NOTE — Telephone Encounter (Signed)
Incoming call received from [name] stating she spoke with the staff at [facility] will complete FL2 form. FL2 was sent to [facility] in error and we can void.

## 2020-03-11 ENCOUNTER — Telehealth: Payer: Medicare Other | Admitting: Nurse Practitioner

## 2020-03-11 NOTE — Telephone Encounter (Signed)
I called the patient to schedule a follow up appt as soon as she can come in, last time she has been seen in office was 04/29/2019 & her last appointment with Janyth Contes was video call on 07/26/19. I left a message for her to call us back to schedule something.

## 2020-03-30 IMAGING — CT CT HEAD W/O CM
3 series · 15 of 37 positions shown, 18 images · non-contrast
Comparison: CT head 09/17/2018

CLINICAL DATA: Head trauma.  Fall

EXAM:
CT HEAD WITHOUT CONTRAST
CT CERVICAL SPINE WITHOUT CONTRAST
TECHNIQUE: Multidetector CT imaging of the head and cervical spine was
performed following the standard protocol without intravenous
contrast. Multiplanar CT image reconstructions of the cervical spine
were also generated.

[Series 2: head bone · axial · 0.43mm/px · z∈[-155,-122]mm · 3 of 50 slices shown]
[im 6/50  bone]
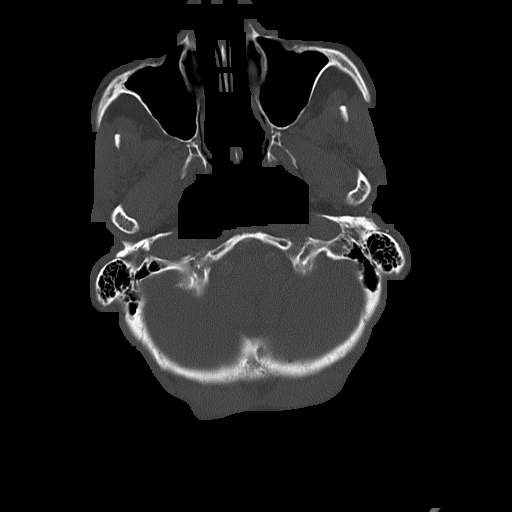
[im 11/50  bone]
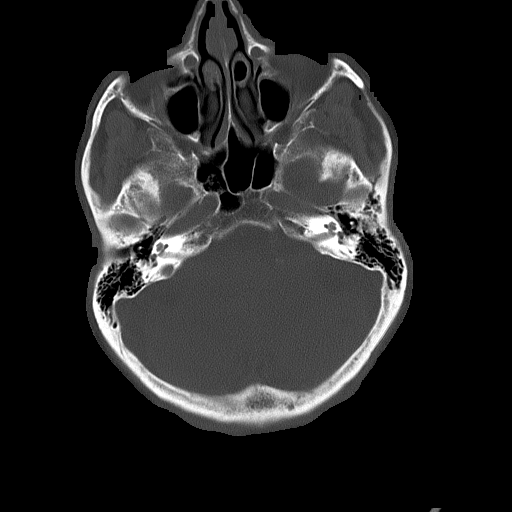
[im 17/50  bone]
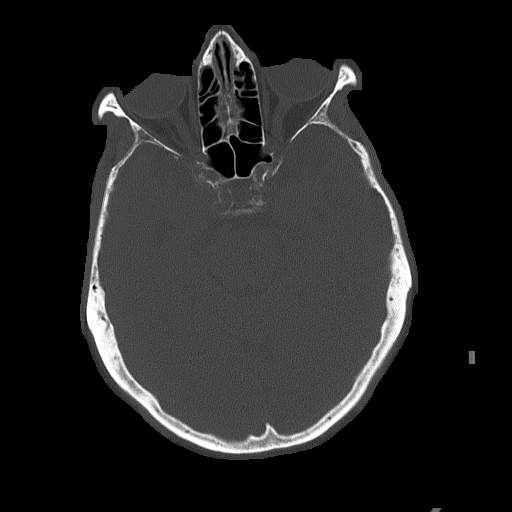

[Series 3: head wo · axial · 0.43mm/px · z∈[-160,-40]mm · 9 of 30 slices shown, 12 images]
[im 3/30  brain]
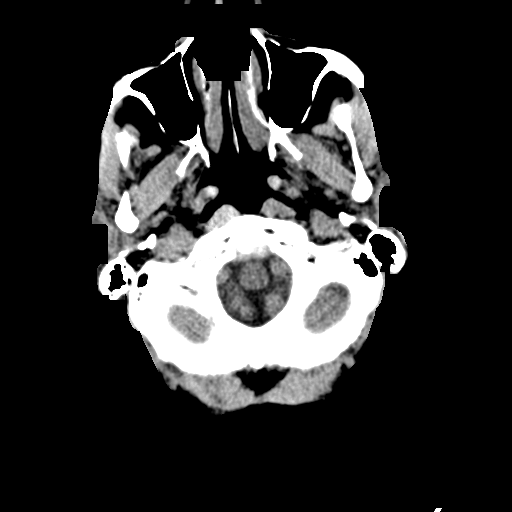
[im 3/30  bone]
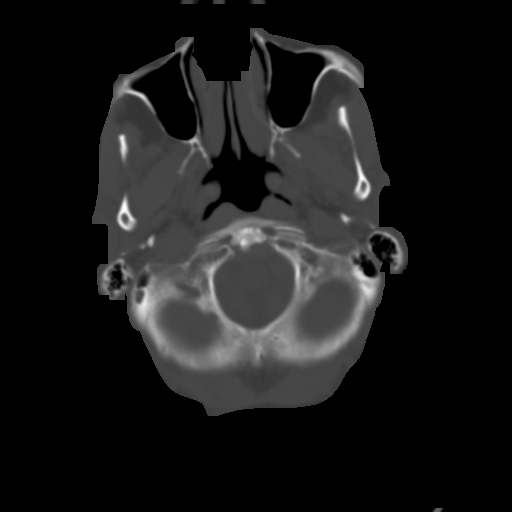
[im 6/30  brain]
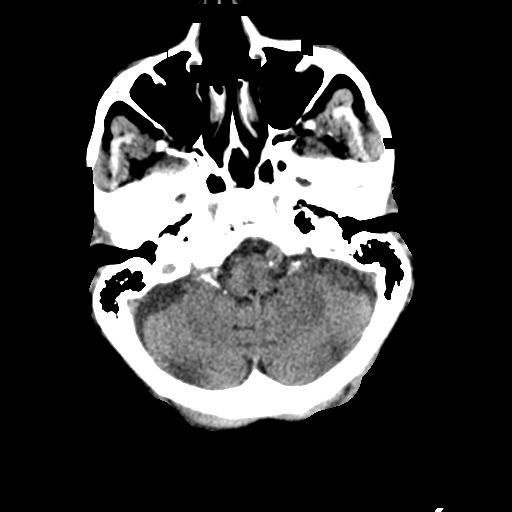
[im 9/30  brain]
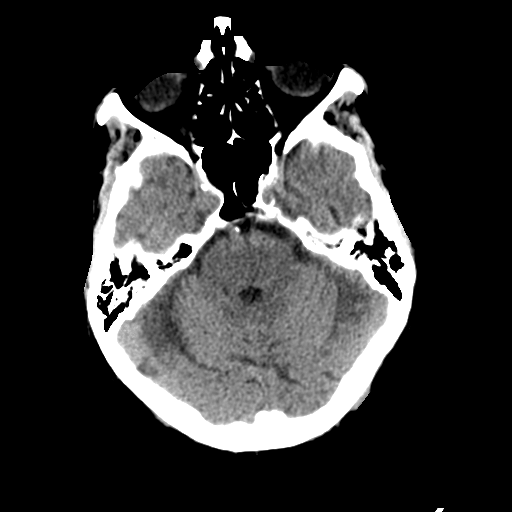
[im 12/30  brain]
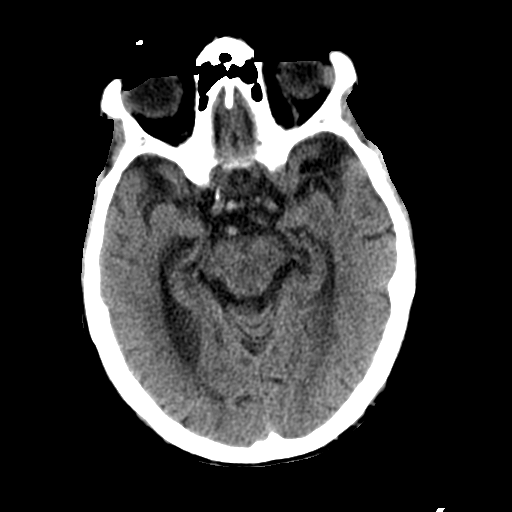
[im 15/30  brain]
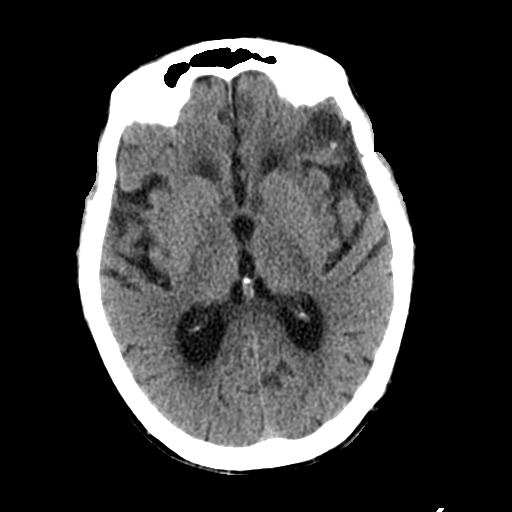
[im 15/30  bone]
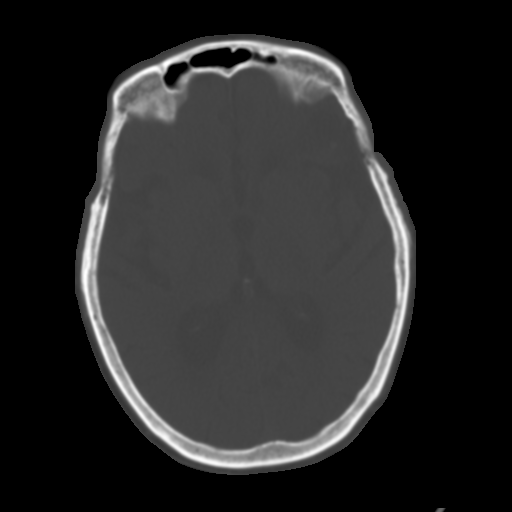
[im 18/30  brain]
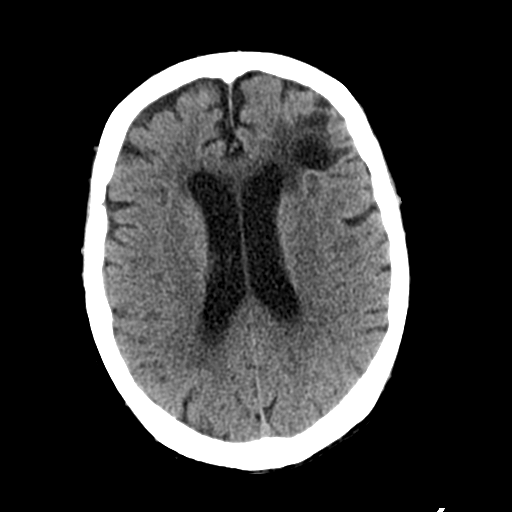
[im 21/30  brain]
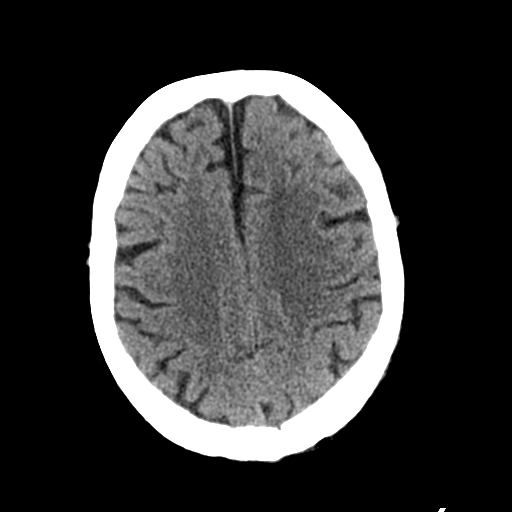
[im 24/30  brain]
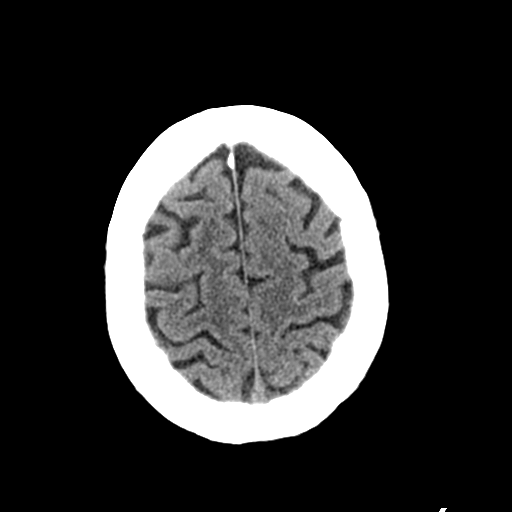
[im 27/30  brain]
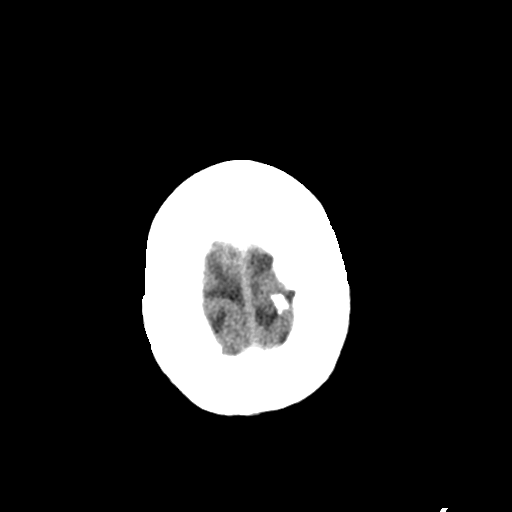
[im 27/30  bone]
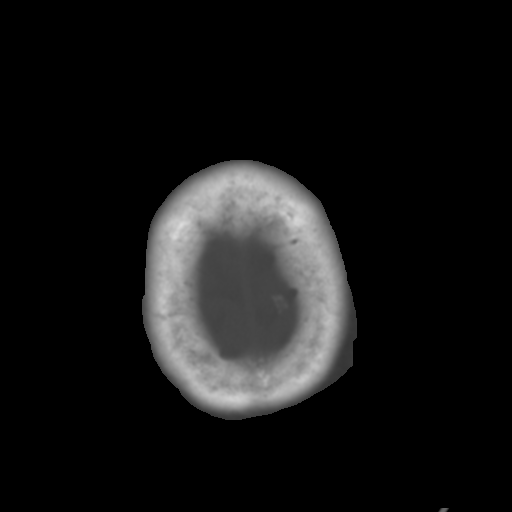

[Series 6: sagittal soft tissue · sagittal · 0.29mm/px · 3 of 52 slices shown]
[im 18/52  brain]
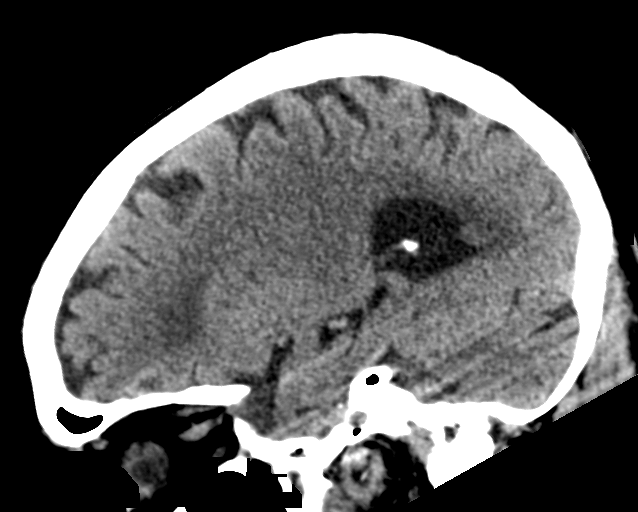
[im 26/52  brain]
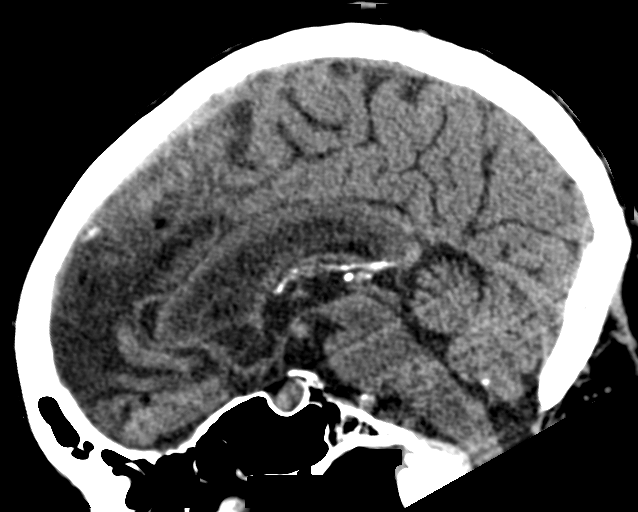
[im 35/52  brain]
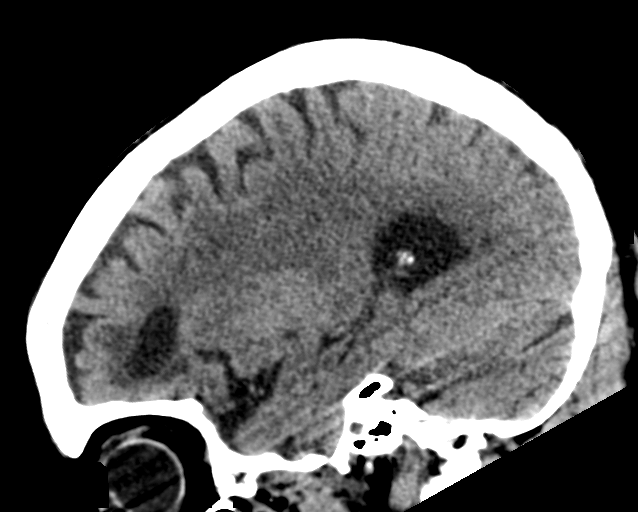

[15 of 37 positions shown; findings below may reference images not displayed]

FINDINGS: CT HEAD FINDINGS

Brain: Moderate atrophy. Chronic ischemic changes in the white
matter. Chronic infarct left frontal lobe unchanged.

Negative for acute infarct.  Negative for acute hemorrhage or mass

Vascular: Negative for hyperdense vessel

Skull:   Negative for fracture

Sinuses/Orbits: Bubbly secretions in the right sphenoid sinus
otherwise clear sinuses. Bilateral cataract surgery.

Other: None

CT CERVICAL SPINE FINDINGS

Alignment: Mild retrolisthesis C3-4. Mild anterolisthesis C7-T1,
T1-T2, T2-3

Skull base and vertebrae: Negative for fracture or mass

Soft tissues and spinal canal: 14 mm right thyroid nodule. No
adenopathy.

Disc levels: Multilevel disc and facet degeneration throughout the
cervical spine. No significant spinal stenosis.

Upper chest: Irregular soft tissue density with associated
calcification left upper lobe anteriorly. This was present on prior
chest CT October 22, 2017. Probable scarring

Other: None
IMPRESSION: Atrophy and chronic ischemic changes. No acute intracranial
abnormality

Negative for cervical spine fracture.  Cervical spondylosis

14 mm right thyroid nodule. No further imaging is indicated based on
size of nodule and patient age.

Irregular soft tissue density with calcification left upper lobe
anteriorly, probable scarring.

## 2020-03-30 IMAGING — CR DG FEMUR 2+V*R*
4 series · 4 of 4 positions shown · non-contrast
Comparison: None.

CLINICAL DATA: Fall

EXAM:
RIGHT FEMUR 2 VIEWS

[x femur proximal ap right]
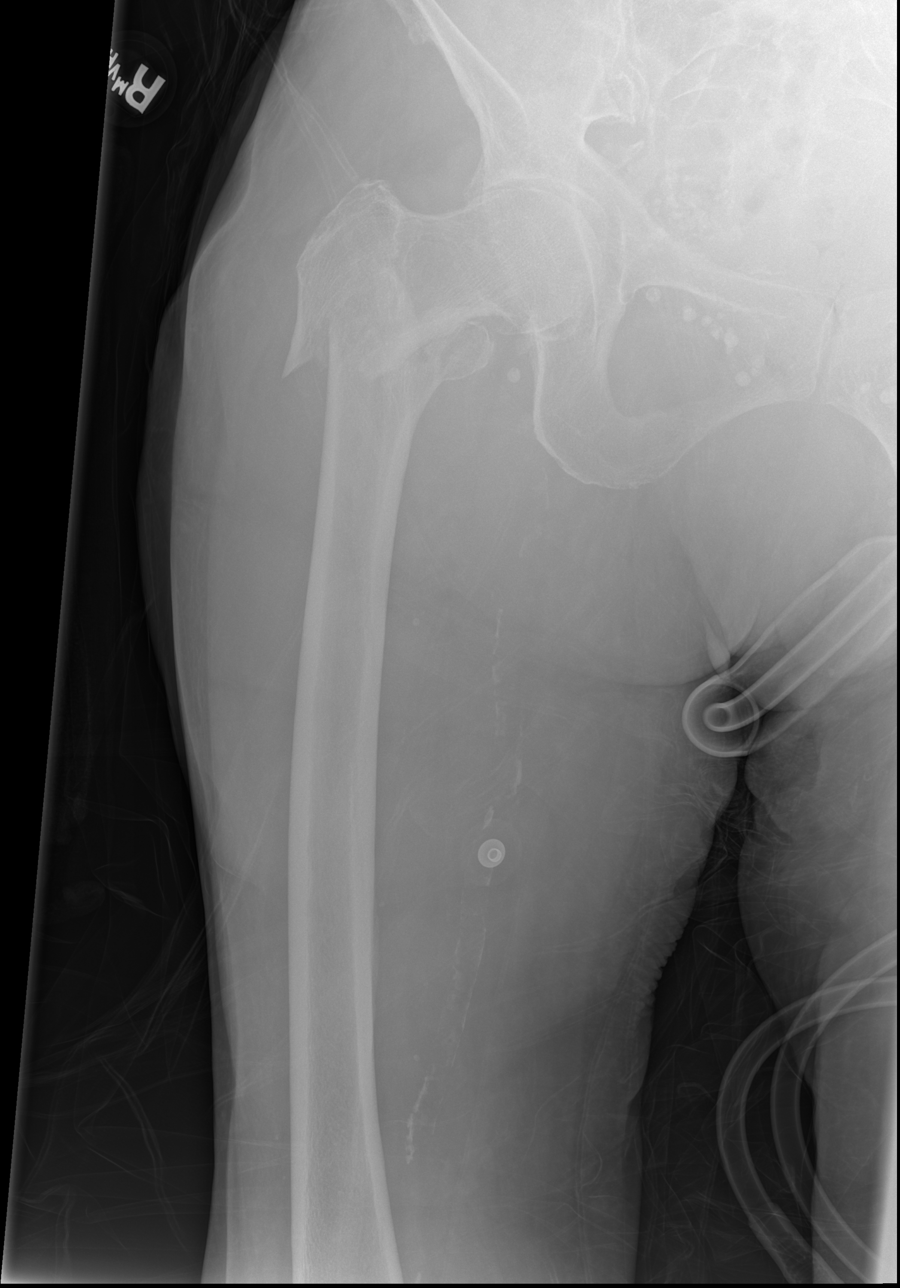

[x femur distal ap right]
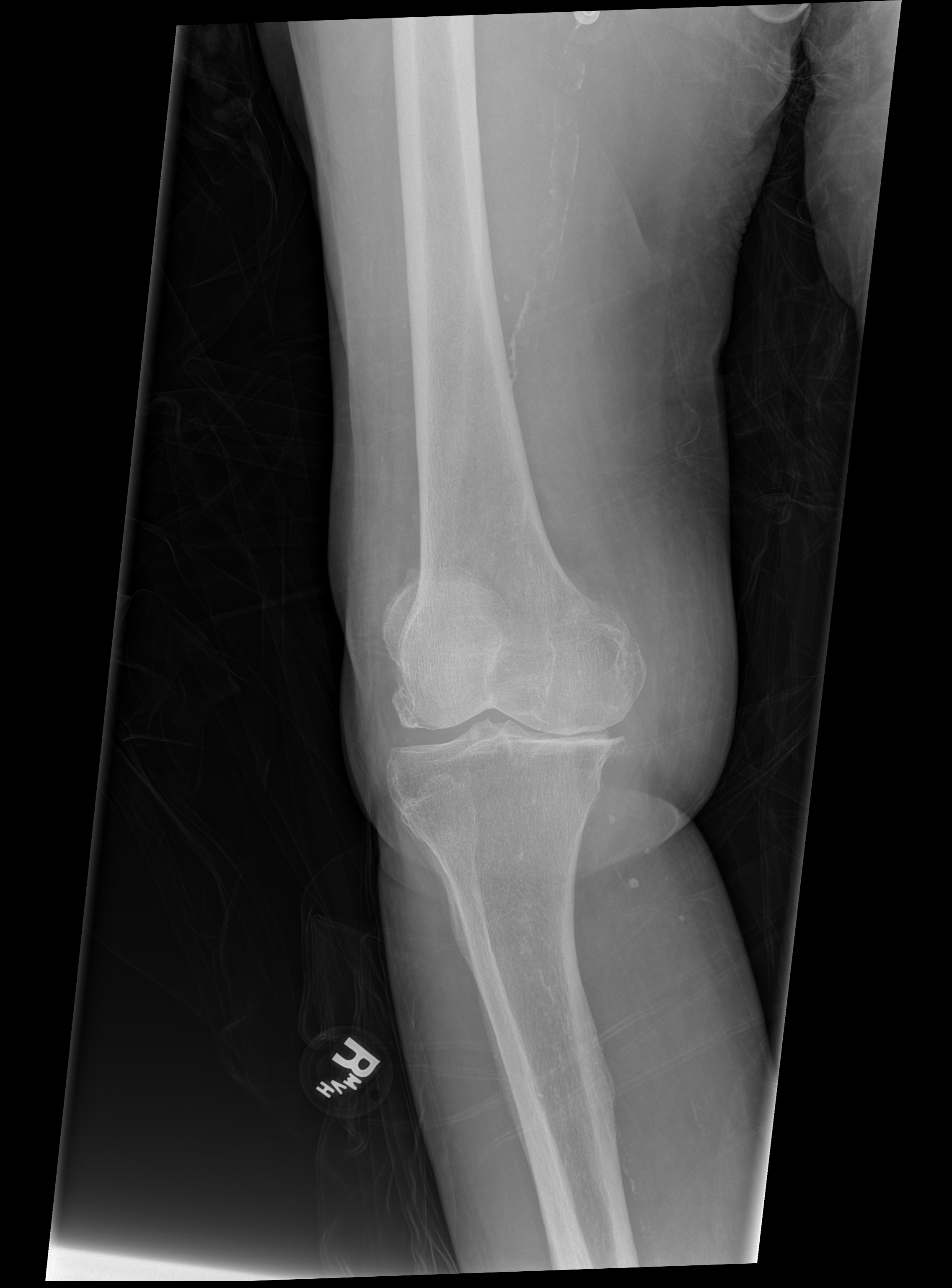

[x femur distal lat right]
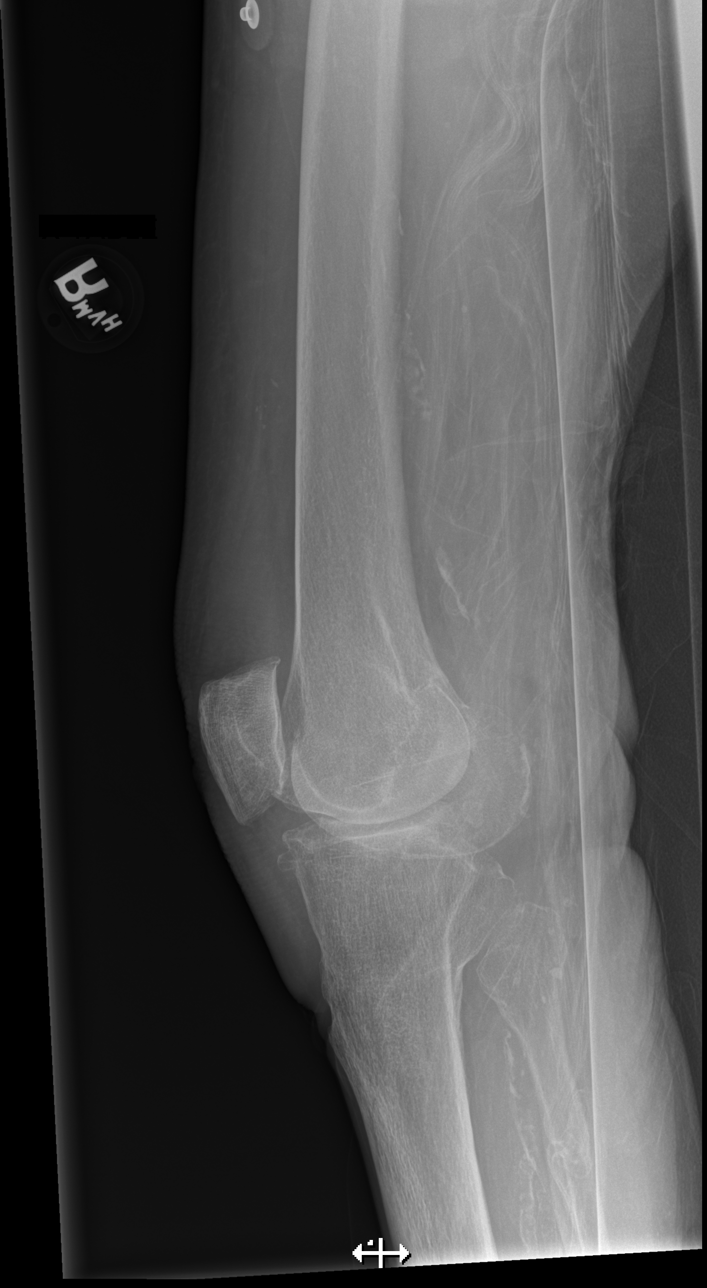

[w hip lat right]
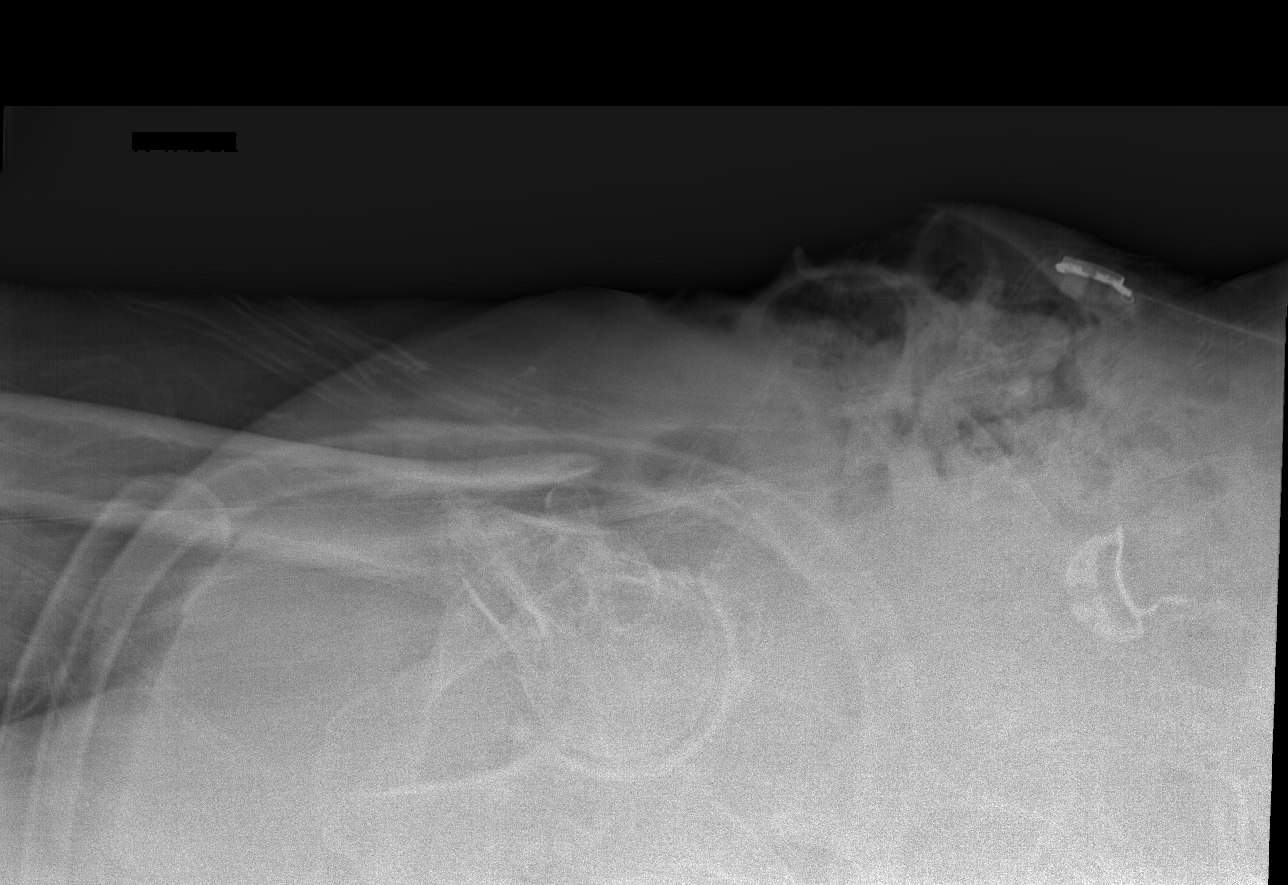

[4 of 4 positions shown; findings below may reference images not displayed]

FINDINGS: Comminuted impacted fracture of the proximal right femoral shaft
involving the greater and lesser trochanters. Femoral head is still
seated in the acetabulum. A healed fracture deformity of the fibula.
Tricompartmental osteoarthritis is seen of the knee. Dense vascular
calcifications are noted. There is diffuse osteopenia.
IMPRESSION: Comminuted impacted fracture of the proximal femoral shaft above the
greater lesser trochanter.

## 2020-05-18 ENCOUNTER — Telehealth: Payer: Self-pay | Admitting: *Deleted

## 2020-05-18 NOTE — Telephone Encounter (Signed)
Patient niece, Corwin Levins, called and stated that they need a letter stating patient's lack of mental incompetency for decisions.  Stated that they are selling patient's Townhome and needs a letter.   Please Advise.

## 2020-05-19 NOTE — Telephone Encounter (Signed)
Communication has been completed. Please let daughter know. Thank you.

## 2020-05-19 NOTE — Telephone Encounter (Signed)
Patient daughter notified and agreed. Stated will print off [place].

## 2020-05-21 ENCOUNTER — Encounter: Payer: Self-pay | Admitting: Nurse Practitioner

## 2020-06-02 ENCOUNTER — Other Ambulatory Visit: Payer: Self-pay

## 2020-06-02 ENCOUNTER — Ambulatory Visit: Payer: Medicare Other | Admitting: Family Medicine

## 2020-06-02 ENCOUNTER — Encounter: Payer: Self-pay | Admitting: Family Medicine

## 2020-06-02 VITALS — BP 140/80 | HR 66 | Temp 97.7°F | Resp 20 | Ht 63.0 in | Wt 129.4 lb

## 2020-06-02 DIAGNOSIS — I480 Paroxysmal atrial fibrillation: Secondary | ICD-10-CM

## 2020-06-02 DIAGNOSIS — F0390 Unspecified dementia without behavioral disturbance: Secondary | ICD-10-CM | POA: Diagnosis not present

## 2020-06-02 DIAGNOSIS — I10 Essential (primary) hypertension: Secondary | ICD-10-CM | POA: Diagnosis not present

## 2020-06-02 NOTE — Patient Instructions (Signed)
We will plan on holding Seroquel for now.  If agitation develops we may need to reinstitute it; please call Would use Tylenol at bedtime for morning stiffness and arthritis Use tramadol as a last resort for pain Continue with stool softener as many folks whose diet is variable and is fluid intake is variable have issues with constipation.

## 2020-06-02 NOTE — Progress Notes (Signed)
Location:  [facility]  Provider: Alain Honey, MD  Code Status: DNR Goals of Care:  Advanced Directives 06/02/2020  Does Patient Have a Medical Advance Directive? Yes  Type of Advance Directive Out of facility DNR (pink MOST or yellow form)  Does patient want to make changes to medical advance directive? No - Patient declined  Copy of [place] in Chart? -  Pre-existing out of facility DNR order (yellow form or pink MOST form) Yellow form placed in chart (order not valid for inpatient use)     Chief Complaint  Patient presents with  . Dementia    Competency Visit    HPI: Patient is a 87 y.o. female seen today for medical management of chronic diseases.  This little lady has advanced dementia.  She lives in assisted living at [facility] and has sitters for about half the day.  She has lived at [place] for about 3 years.  Prior to that she lived in a townhome.  Her niece who is her power of attorney has held onto the townhome thinking she might be able to return at some point but now she says that is not likely and would like to sell it.  She needs letters from 2 providers explaining the reasons for slight swelling property. I reviewed her medicine list from the facility and made some minor changes such as holding Seroquel and using tramadol only as a as needed basis and using Tylenol at bedtime to help morning stiffness and arthritis symptoms   Past Medical History:  Diagnosis Date  . A-fib ([place])   . Anemia   . Anxiety   . Dry skin    on ears  . Gastroesophageal reflux   . Hearing loss    significant  uses hearing aids  . High blood pressure   . Hyperlipidemia   . Loss of appetite   . Loss of smell   . Macular degeneration   . Malnutrition ([place])   . Osteoporosis   . Persistent dry cough   . Skin cancer    basal / squamous  . Slow transit constipation    abstracted from new patient packet   . Vitamin D deficiency 01/26/2018    Past  Surgical History:  Procedure Laterality Date  . FEMUR IM NAIL Right 12/28/2018   Procedure: INTRAMEDULLARY (IM) NAIL FEMORAL;  Surgeon: Dorna Leitz, MD;  Location: [facility];  Service: Orthopedics;  Laterality: Right;    No Known Allergies  Allergies as of 06/02/2020   No Known Allergies     Medication List       Accurate as of June 02, 2020  4:21 PM. If you have any questions, ask your nurse or doctor.        STOP taking these medications   calcium carbonate 600 MG Tabs tablet Commonly known as: OS-CAL Stopped by: Alain Honey, MD   cholecalciferol 25 MCG (1000 UNIT) tablet Commonly known as: VITAMIN D Stopped by: Alain Honey, MD   Daily Vite Tabs Stopped by: Alain Honey, MD   nystatin cream Commonly known as: MYCOSTATIN Stopped by: Alain Honey, MD   simvastatin 20 MG tablet Commonly known as: ZOCOR Stopped by: Alain Honey, MD     TAKE these medications   acetaminophen 325 MG tablet Commonly known as: TYLENOL Take 650 mg by mouth daily. Mid-day   ALPRAZolam 0.25 MG tablet Commonly known as: XANAX Take 0.25 mg by mouth every 6 (six) hours as needed for anxiety.  What changed: Another medication with the same name was removed. Continue taking this medication, and follow the directions you see here. Changed by: Alain Honey, MD   BAZA EX Apply 1 application topically as needed.   busPIRone 10 MG tablet Commonly known as: BUSPAR Take 1 tablet (10 mg total) by mouth 3 (three) times daily.   docusate sodium 100 MG capsule Commonly known as: COLACE Take 100 mg by mouth 2 (two) times daily as needed for mild constipation.   EQ VISION FORMULA 50+ PO Take 1 tablet by mouth every 12 (twelve) hours.   eucerin lotion Apply topically as needed for dry skin. Spread topically to both legs once daily   famotidine 20 MG tablet Commonly known as: PEPCID Take 1 tablet (20 mg total) by mouth at bedtime.   Jobst Active 20-63mmHg Medium Misc by Does not  apply route. Provide every morning and remove every evening   memantine 28 MG Cp24 24 hr capsule Commonly known as: Namenda XR Take 1 capsule (28 mg total) by mouth daily.   metoprolol succinate 25 MG 24 hr tablet Commonly known as: TOPROL-XL Take 1 tablet (25 mg total) by mouth daily.   polyethylene glycol 17 g packet Commonly known as: MIRALAX / GLYCOLAX Take 17 g by mouth daily. Mix 17gm in liquid and drink by mouth once daily as needed for constipation   potassium chloride SA 20 MEQ tablet Commonly known as: KLOR-CON Take 1 tablet (20 mEq total) by mouth daily.   QUEtiapine 25 MG tablet Commonly known as: SEROQUEL Take 25 mg by mouth at bedtime.   saccharomyces boulardii 250 MG capsule Commonly known as: FLORASTOR Take 250 mg by mouth 2 (two) times daily.   sertraline 50 MG tablet Commonly known as: ZOLOFT Take 75 mg by mouth daily. What changed: Another medication with the same name was removed. Continue taking this medication, and follow the directions you see here. Changed by: Alain Honey, MD   torsemide 20 MG tablet Commonly known as: DEMADEX Take 20 mg by mouth daily.   traMADol 50 MG tablet Commonly known as: ULTRAM Take 50 mg by mouth every 6 (six) hours as needed (while awake as needed).       Review of Systems:  Review of Systems  Constitutional: Positive for activity change.  HENT: Negative.   Eyes: Negative.   Respiratory: Negative.   Cardiovascular: Negative.   Genitourinary: Negative.   Neurological: Negative.   Psychiatric/Behavioral: Positive for agitation and confusion.  All other systems reviewed and are negative.   Health Maintenance  Topic Date Due  . DEXA SCAN  Never done  . COVID-19 Vaccine (3 - Booster for Moderna series) 09/29/2019  . INFLUENZA VACCINE  10/06/2019  . TETANUS/TDAP  08/28/2028  . PNA vac Low Risk Adult  Completed  . HPV VACCINES  Aged Out    Physical Exam: Vitals:   06/02/20 1515  BP: 140/80  Pulse: 66   Resp: 20  Temp: 97.7 F (36.5 C)  TempSrc: Temporal  SpO2: 95%  Weight: 129 lb 6.4 oz (58.7 kg)  Height: 5\' 3"  (1.6 m)   Body mass index is 22.92 kg/m. Physical Exam Vitals and nursing note reviewed.  Constitutional:      Appearance: Normal appearance.  HENT:     Head: Normocephalic.     Right Ear: Tympanic membrane normal.     Left Ear: Tympanic membrane normal.     Ears:     Comments: Patient has significant hearing loss  Mouth/Throat:     Mouth: Mucous membranes are moist.  Eyes:     Pupils: Pupils are equal, round, and reactive to light.  Cardiovascular:     Rate and Rhythm: Normal rate.  Abdominal:     General: Abdomen is flat.     Palpations: Abdomen is soft.  Neurological:     General: No focal deficit present.     Mental Status: She is alert.     Comments: Patient is oriented to name but not place or time  Psychiatric:        Mood and Affect: Mood normal.        Behavior: Behavior normal.     Comments: Thought content and judgment seem to be lacking     Labs reviewed: Basic Metabolic Panel: No results for input(s): NA, K, CL, CO2, GLUCOSE, BUN, CREATININE, CALCIUM, MG, PHOS, TSH in the last 8760 hours. Liver Function Tests: No results for input(s): AST, ALT, ALKPHOS, BILITOT, PROT, ALBUMIN in the last 8760 hours. No results for input(s): LIPASE, AMYLASE in the last 8760 hours. No results for input(s): AMMONIA in the last 8760 hours. CBC: No results for input(s): WBC, NEUTROABS, HGB, HCT, MCV, PLT in the last 8760 hours. Lipid Panel: No results for input(s): CHOL, HDL, LDLCALC, TRIG, CHOLHDL, LDLDIRECT in the last 8760 hours. No results found for: HGBA1C  Procedures since last visit: No results found.  Assessment/Plan There are no diagnoses linked to this encounter. 1. Essential hypertension Blood pressures are controlled on metoprolol along with torsemide and potassium  2. Paroxysmal atrial fibrillation (HCC) Heart rate is regular rate and  rhythm today  3. Dementia without behavioral disturbance, unspecified dementia type [facility]) Patient is appropriate for assisted living with sitters.  I think in the near future she might be a candidate for memory care but that will be somewhat dependent on her power of attorney wishes Did complete a letter today stating that she has significant decrease in cognitive abilities   Labs/tests ordered:   Next appt: About 4 months  Zyan Mirkin M. Sabra Heck, [place] [address] [place], [place] Office [phone]

## 2020-06-03 ENCOUNTER — Telehealth: Payer: Self-pay | Admitting: *Deleted

## 2020-06-03 NOTE — Telephone Encounter (Signed)
[name], Caregiver, called requesting OV note from yesterday 06/02/2020 to be faxed to [facility] to fax: [phone].  OV note faxed.

## 2020-06-05 ENCOUNTER — Telehealth: Payer: Self-pay

## 2020-06-05 NOTE — Telephone Encounter (Signed)
Yes okay to fax orders to resume Seroquel 25 mg by mouth daily at bedtime.

## 2020-06-05 NOTE — Telephone Encounter (Signed)
Telephone encounter faxed to [name] at [phone] to serve as an order   I called [name] to inform her of Jessica's response

## 2020-06-05 NOTE — Telephone Encounter (Signed)
Incoming call received from patients niece [name] stating patient seen Dr.Miller recently and he discontinued Seroquel. Patient is refusing medications, walking the halls looking for deceased husband, and displaying combative behaviors.   [name] would like to know if patient can be restarted on Seroquel?  If so order needs to be faxed to [facility]

## 2020-07-14 IMAGING — CR DG CHEST 2V
2 series · 2 of 2 positions shown · non-contrast
Comparison: December 30, 2018

CLINICAL DATA: Shortness of breath.

EXAM:
CHEST - 2 VIEW

[w chest pa]
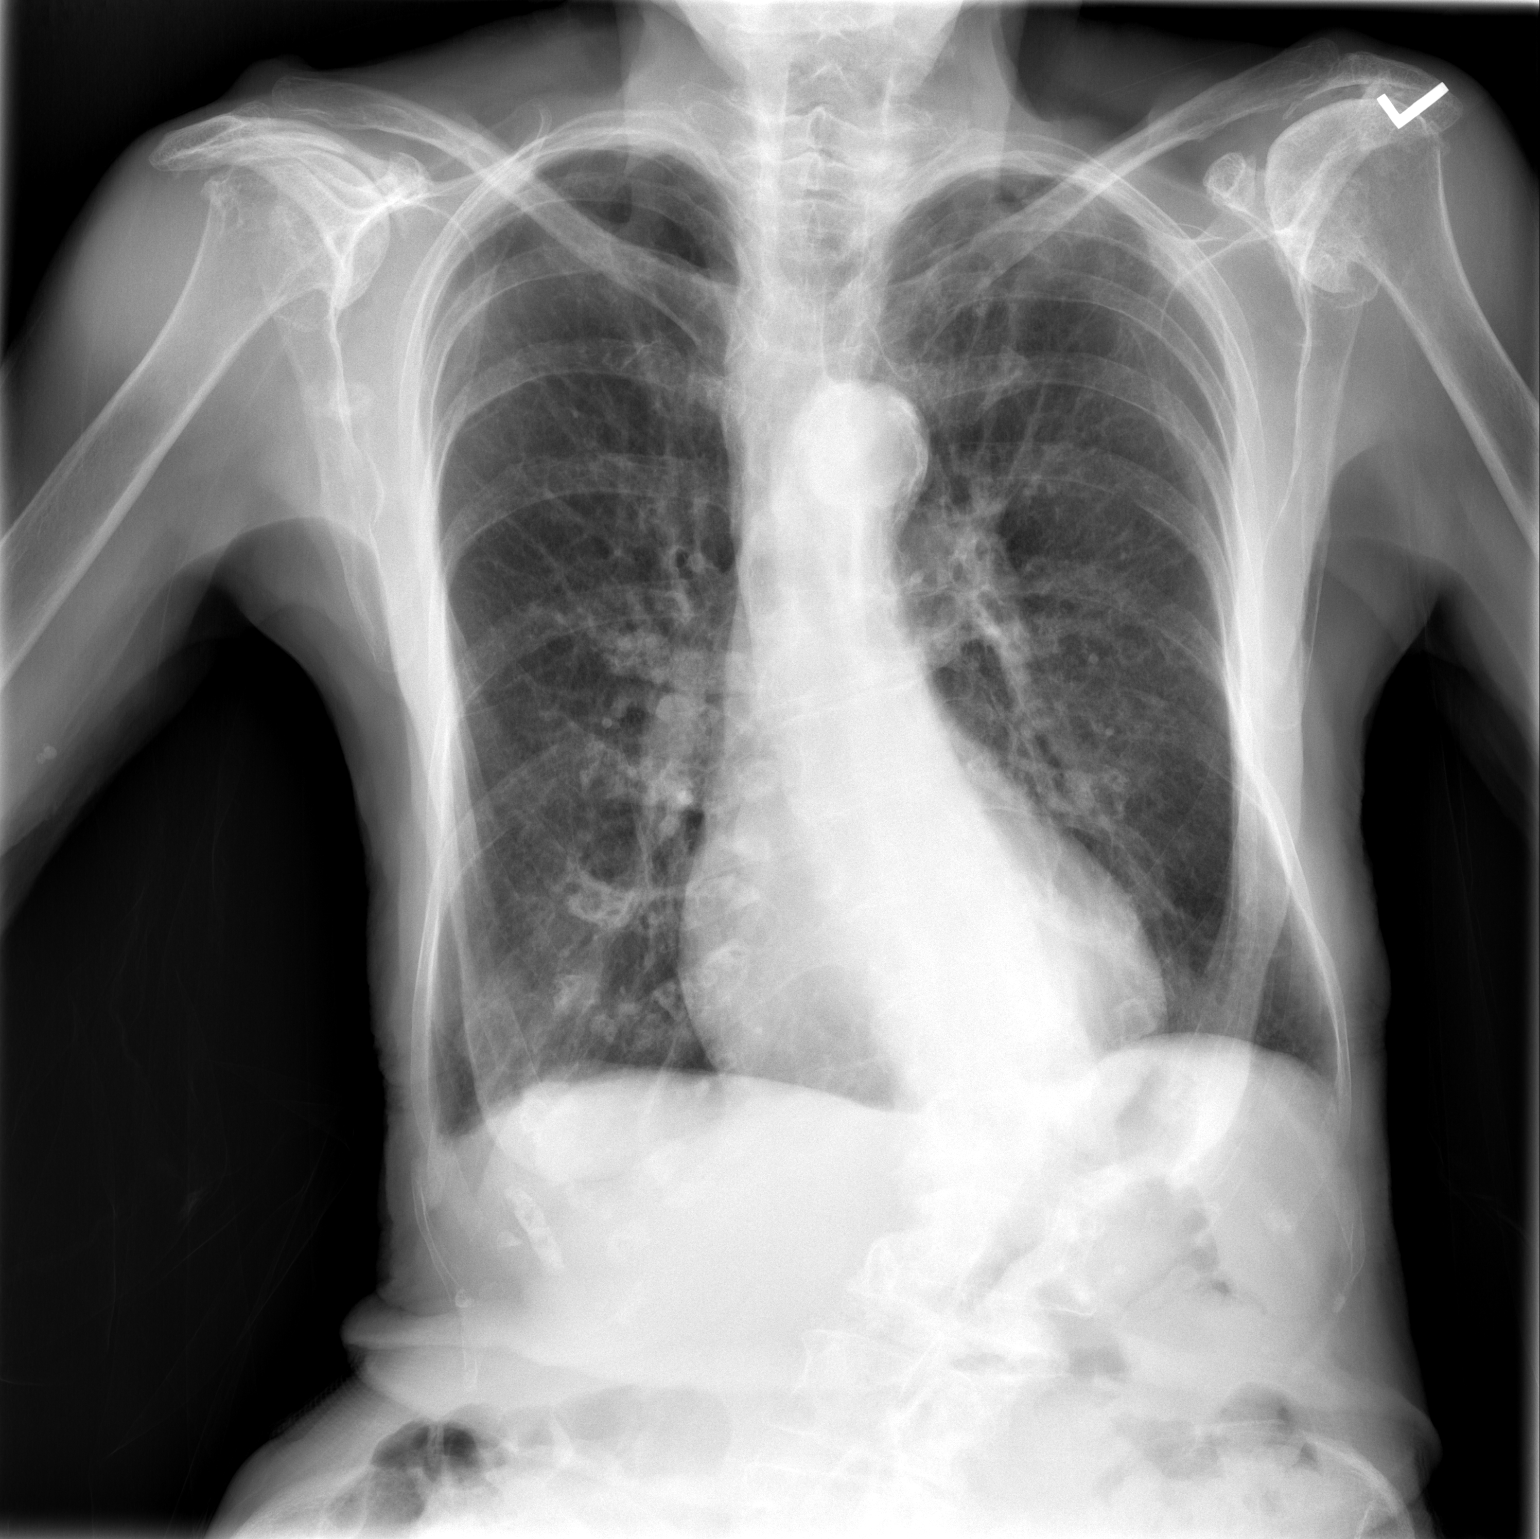

[w chest lat]
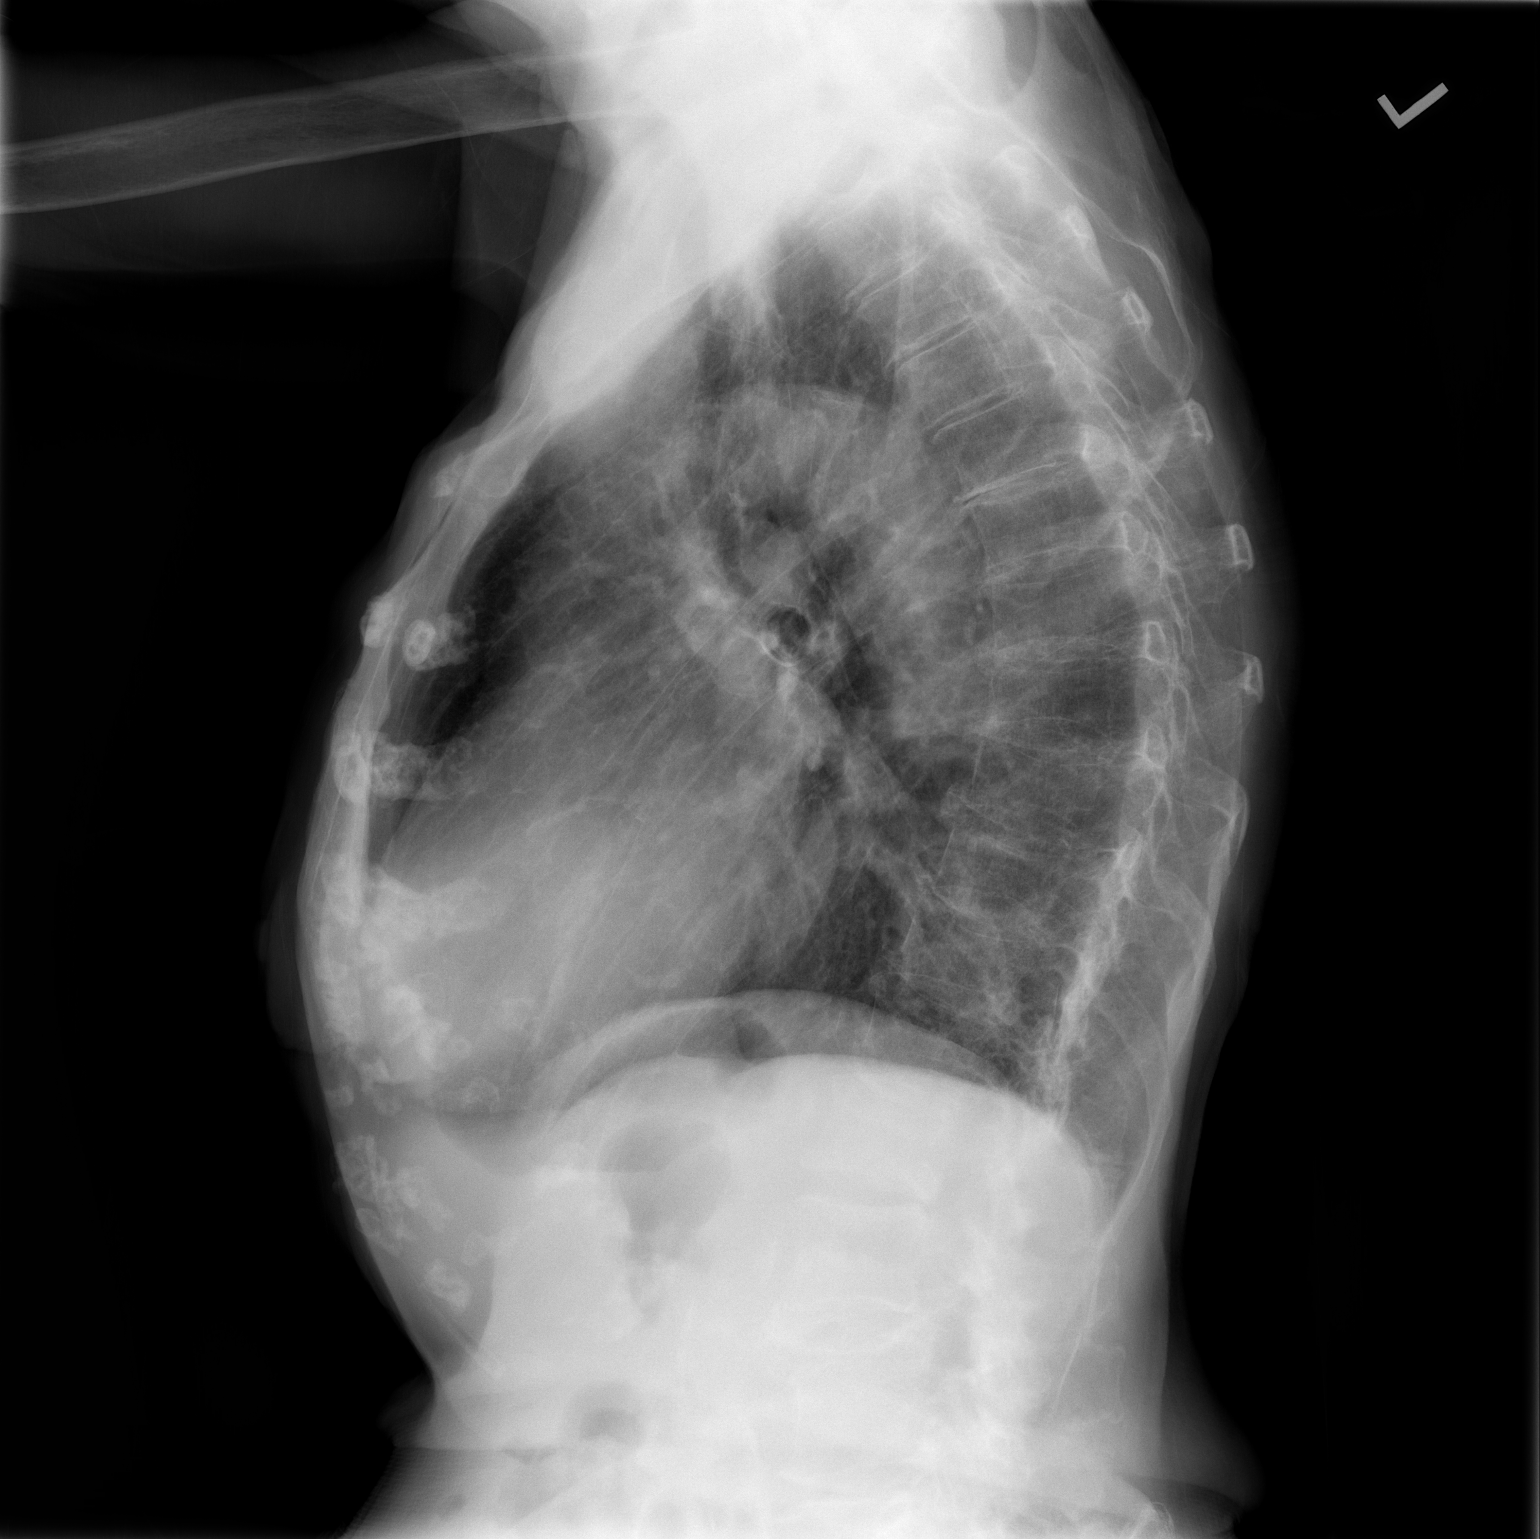

[2 of 2 positions shown; findings below may reference images not displayed]

FINDINGS: The lungs are hyperinflated.

Chronic appearing increased lung markings are seen without evidence
of acute infiltrate, pleural effusion or pneumothorax.

The heart size and mediastinal contours are within normal limits.

There is marked severity calcification of the aortic arch.

There is marked severity levoscoliosis of the lower thoracic spine
with multilevel degenerative changes.
IMPRESSION: Findings likely consistent COPD without evidence of acute or active
cardiopulmonary disease.

## 2020-09-04 ENCOUNTER — Ambulatory Visit (INDEPENDENT_AMBULATORY_CARE_PROVIDER_SITE_OTHER): Payer: Medicare Other | Admitting: Family

## 2020-09-04 ENCOUNTER — Encounter: Payer: Self-pay | Admitting: Family

## 2020-09-04 ENCOUNTER — Other Ambulatory Visit: Payer: Self-pay

## 2020-09-04 DIAGNOSIS — R399 Unspecified symptoms and signs involving the genitourinary system: Secondary | ICD-10-CM

## 2020-09-04 DIAGNOSIS — R441 Visual hallucinations: Secondary | ICD-10-CM | POA: Diagnosis not present

## 2020-09-04 DIAGNOSIS — F0391 Unspecified dementia with behavioral disturbance: Secondary | ICD-10-CM

## 2020-09-04 NOTE — Patient Instructions (Signed)
-   Care giver to pick up urine specimen supplies from the [facility] office then return urine specimen to the office for urine analysis and culture.

## 2020-09-04 NOTE — Progress Notes (Addendum)
This service is provided via telemedicine  No vital signs collected/recorded due to the encounter was a telemedicine visit.   Location of patient (ex: home, work): Home.   Patient consents to a telephone visit: Yes.  Location of the provider (ex: office, home): [employer].   Name of any referring provider: [name], NP   Names of all persons participating in the telemedicine service and their role in the encounter: Patient, Caregiver [name] Rancourt, Heriberto Antigua, RMA, Zeddie Njie, [place], NP.    Time spent on call: 8 minutes spent on the phone with Medical Assistant.     Provider: Henlee Donovan FNP-C  [name], NP  Patient Care Team: [name], NP as PCP - General (Geriatric Medicine) Jolene Schimke, MD as Referring Physician (Dermatology) Birder Robson, MD as Referring Physician (Ophthalmology) Jaymes Graff, DO as Consulting Physician (Orthopedic Surgery)  Extended Emergency Contact Information Primary Emergency Contact: Beryle Flock Mobile Phone: [phone] Relation: Other Secondary Emergency Contact: Corwin Levins Mobile Phone: [phone] Relation: Niece  Code Status:  DNR Goals of care: Advanced Directive information Advanced Directives 09/04/2020  Does Patient Have a Medical Advance Directive? Yes  Type of Advance Directive [place]  Does patient want to make changes to medical advance directive? No - Patient declined  Copy of [place] in Chart? Yes - validated most recent copy scanned in chart (See row information)  Pre-existing out of facility DNR order (yellow form or pink MOST form) -     Chief Complaint  Patient presents with   Acute Visit    Caregiver complains of memory getting worse and seeing things.     HPI:  Pt is a 87 y.o. female seen today for an acute visit for evaluation of worsening memory issues and seeing things.care giver provides HPI  information due to patient's cognitive impairment.states has been taking care of patient for several years now but in the last few days patient does not seem to remember her.Also gives hard time to techs trying to give her medication.usually request her Xanax when she is really agitated but nor every six hrs.care giver wonders if her Xanax needs to be increased. Has had increased hallucination seeing things that are not there and talking to them.  Medical history of dementia with behavioral disturbance. Appetite has been good.No signs of upper respiratory infection,fever or chills  Past Medical History:  Diagnosis Date   A-fib ([place])    Anemia    Anxiety    Dry skin    on ears   Gastroesophageal reflux    Hearing loss    significant  uses hearing aids   High blood pressure    Hyperlipidemia    Loss of appetite    Loss of smell    Macular degeneration    Malnutrition (HCC)    Osteoporosis    Persistent dry cough    Skin cancer    basal / squamous   Slow transit constipation    abstracted from new patient packet    Vitamin D deficiency 01/26/2018   Past Surgical History:  Procedure Laterality Date   FEMUR IM NAIL Right 12/28/2018   Procedure: INTRAMEDULLARY (IM) NAIL FEMORAL;  Surgeon: Dorna Leitz, MD;  Location: [facility];  Service: Orthopedics;  Laterality: Right;    No Known Allergies  Outpatient Encounter Medications as of 09/04/2020  Medication Sig   acetaminophen (TYLENOL) 325 MG tablet Take 650 mg by mouth daily. Mid-day  ALPRAZolam (XANAX) 0.25 MG tablet Take 0.25 mg by mouth every 6 (six) hours as needed for anxiety.   busPIRone (BUSPAR) 10 MG tablet Take 1 tablet (10 mg total) by mouth 3 (three) times daily.   Chloroxylenol-Zinc Oxide (BAZA EX) Apply 1 application topically as needed.   docusate sodium (COLACE) 100 MG capsule Take 100 mg by mouth 2 (two) times daily as needed for mild constipation.   Elastic Bandages & Supports (JOBST ACTIVE 20-30MMHG MEDIUM) MISC by  Does not apply route. Provide every morning and remove every evening   Emollient (EUCERIN) lotion Apply topically as needed for dry skin. Spread topically to both legs once daily   famotidine (PEPCID) 20 MG tablet Take 1 tablet (20 mg total) by mouth at bedtime.   memantine (NAMENDA XR) 28 MG CP24 24 hr capsule Take 1 capsule (28 mg total) by mouth daily.   metoprolol succinate (TOPROL-XL) 25 MG 24 hr tablet Take 1 tablet (25 mg total) by mouth daily.   Multiple Vitamins-Minerals (EQ VISION FORMULA 50+ PO) Take 1 tablet by mouth every 12 (twelve) hours.   polyethylene glycol (MIRALAX / GLYCOLAX) 17 g packet Take 17 g by mouth daily. Mix 17gm in liquid and drink by mouth once daily as needed for constipation   potassium chloride (KLOR-CON) 20 MEQ tablet Take 1 tablet (20 mEq total) by mouth daily.   QUEtiapine (SEROQUEL) 25 MG tablet Take 25 mg by mouth at bedtime.   saccharomyces boulardii (FLORASTOR) 250 MG capsule Take 250 mg by mouth 2 (two) times daily.   sertraline (ZOLOFT) 50 MG tablet Take 75 mg by mouth daily.   torsemide (DEMADEX) 20 MG tablet Take 20 mg by mouth daily.   traMADol (ULTRAM) 50 MG tablet Take 50 mg by mouth every 6 (six) hours as needed (while awake as needed).   No facility-administered encounter medications on file as of 09/04/2020.    Review of Systems  Unable to perform ROS: Dementia (HPI provided by care giver)  Constitutional:  Negative for appetite change, chills, fatigue, fever and unexpected weight change.  HENT:  Negative for congestion, dental problem, ear discharge, ear pain, facial swelling, hearing loss, nosebleeds, postnasal drip, rhinorrhea, sinus pressure, sinus pain, sneezing, sore throat, tinnitus and trouble swallowing.   Eyes:  Negative for pain, discharge, redness and itching.  Respiratory:  Negative for cough, chest tightness, shortness of breath and wheezing.   Cardiovascular:  Negative for chest pain, palpitations and leg swelling.   Gastrointestinal:  Negative for abdominal distention, abdominal pain, blood in stool, constipation, diarrhea, nausea and vomiting.  Skin:  Negative for color change, pallor, rash and wound.  Neurological:  Negative for dizziness, syncope, speech difficulty, weakness, light-headedness, numbness and headaches.  Psychiatric/Behavioral:  Positive for agitation, behavioral problems, confusion and hallucinations. Negative for sleep disturbance. The patient is not nervous/anxious.    Immunization History  Administered Date(s) Administered   Fluad Quad(high Dose 65+) 12/14/2018   Influenza, High Dose Seasonal PF 01/02/2017, 02/21/2018   Influenza-Unspecified 12/17/2014   Moderna Sars-Covid-2 Vaccination 03/04/2019, 04/01/2019   Pneumococcal Conjugate-13 08/10/2016   Pneumococcal Polysaccharide-23 10/12/2010   Tdap 10/23/2006, 08/29/2018   Pertinent  Health Maintenance Due  Topic Date Due   DEXA SCAN  Never done   INFLUENZA VACCINE  10/05/2020   PNA vac Low Risk Adult  Completed   Fall Risk  09/04/2020 06/02/2020 07/26/2019 06/27/2019 04/29/2019  Falls in the past year? 0 0 1 0 1  Number falls in past yr: 0 0 1  0 1  Injury with Fall? 0 0 1 0 0  Risk for fall due to : No Fall Risks - History of fall(s) - History of fall(s);Impaired balance/gait  Follow up Falls evaluation completed - - - -   Functional Status Survey:    There were no vitals filed for this visit. There is no height or weight on file to calculate BMI. Physical Exam Unable to complete on telephone visit.   Labs reviewed: No results for input(s): NA, K, CL, CO2, GLUCOSE, BUN, CREATININE, CALCIUM, MG, PHOS in the last 8760 hours. No results for input(s): AST, ALT, ALKPHOS, BILITOT, PROT, ALBUMIN in the last 8760 hours. No results for input(s): WBC, NEUTROABS, HGB, HCT, MCV, PLT in the last 8760 hours. Lab Results  Component Value Date   TSH 1.49 11/28/2018   No results found for: HGBA1C Lab Results  Component Value Date    CHOL 116 09/06/2018   HDL 55 09/06/2018   LDLCALC 43 09/06/2018   TRIG 101 09/06/2018   CHOLHDL 2.1 09/06/2018    Significant Diagnostic Results in last 30 days:  No results found.  Assessment/Plan  1. Hallucination, visual Worsening seeing things which are not there and talking to them.  Possible worsening of dementia but will rule out other acute etiologies. - advised care giver to pick up urine specimen supplies to collect urine from patient for U/A and C/S then return to office. Jasmine Dillard,CMA here at [facility] notified to prepare supplies with a toilet hut for care giver to collect specimen.   2. Dementia with behavioral disturbance, unspecified dementia type ([place]) Worsening hallucination as above and giving techs hard time with medication.will rule out UTI Does have Xanax 0.25 mg tablet every 6 hrs PRN but care giver rarely request for it.will not adjust medication for now. - continue on Xanax,Buspirone,Namenda,Seroquel and Zoloft   3. Symptoms of urinary tract infection No fever reported.Increased hallucination. Will obtain urine specimen to rule out UTI  - POC Urinalysis Dipstick - Urine Culture   Family/ staff Communication: Reviewed plan of care with patient's Care giver verbalized understanding.   Labs/tests ordered: Care giver to pickup urine specimen supplies then return specimen for U/A and C/S   Next Appointment: As needed if symptoms worsen or fail to improve   I connected with  Gabriel Alto on 09/04/20 by a Telephone enabled telemedicine application and verified that I am speaking with the correct person using two identifiers.   I discussed the limitations of evaluation and management by telemedicine. The patient expressed understanding and agreed to proceed.  Spent 11 minutes of non-face to face with patient    Sandrea Hughs, NP

## 2020-09-08 LAB — POCT URINALYSIS DIPSTICK
Bilirubin, UA: NEGATIVE
Glucose, UA: NEGATIVE
Ketones, UA: POSITIVE
Nitrite, UA: POSITIVE
Protein, UA: NEGATIVE
Spec Grav, UA: 1.01 (ref 1.010–1.025)
Urobilinogen, UA: NEGATIVE E.U./dL — AB
pH, UA: 6 (ref 5.0–8.0)

## 2020-09-08 NOTE — Addendum Note (Signed)
Addended by: Heriberto Antigua E on: 09/08/2020 02:59 PM   Modules accepted: Orders

## 2020-09-09 LAB — URINE CULTURE
MICRO NUMBER:: 12082268
SPECIMEN QUALITY:: ADEQUATE

## 2020-09-10 ENCOUNTER — Other Ambulatory Visit: Payer: Self-pay

## 2020-09-10 ENCOUNTER — Other Ambulatory Visit: Payer: Self-pay | Admitting: *Deleted

## 2020-09-10 DIAGNOSIS — R441 Visual hallucinations: Secondary | ICD-10-CM

## 2020-09-11 LAB — URINE CULTURE
MICRO NUMBER:: 12093712
Result:: NO GROWTH
SPECIMEN QUALITY:: ADEQUATE

## 2020-09-15 ENCOUNTER — Encounter: Payer: Self-pay | Admitting: Nurse Practitioner

## 2020-09-16 ENCOUNTER — Encounter: Payer: Self-pay | Admitting: Family

## 2020-09-16 ENCOUNTER — Other Ambulatory Visit: Payer: Self-pay

## 2020-09-16 ENCOUNTER — Ambulatory Visit: Payer: Medicare Other | Admitting: Family

## 2020-09-16 VITALS — BP 120/88 | HR 82 | Temp 97.7°F | Resp 16 | Ht 63.0 in | Wt 140.0 lb

## 2020-09-16 DIAGNOSIS — S81811A Laceration without foreign body, right lower leg, initial encounter: Secondary | ICD-10-CM

## 2020-09-16 NOTE — Patient Instructions (Signed)
-   Home health Nurse to Cleanse right leg skin tear with saline,pat dry,apply Triple antibiotic ointment and cover with Non-adhesive gauze and wrap with Kerlix and secure with tape.change dressing daily.care giver may change dressing when HHN is not available.    - Notify provider if running any fever,chills odor,redness or drainage.

## 2020-09-16 NOTE — Progress Notes (Signed)
Provider: Jeanice Dempsey FNP-C  [name], NP  Patient Care Team: [name], NP as PCP - General (Geriatric Medicine) Jolene Schimke, MD as Referring Physician (Dermatology) Birder Robson, MD as Referring Physician (Ophthalmology) Jaymes Graff, DO as Consulting Physician (Orthopedic Surgery)  Extended Emergency Contact Information Primary Emergency Contact: Beryle Flock Mobile Phone: [phone] Relation: Other Secondary Emergency Contact: Corwin Levins Mobile Phone: [phone] Relation: Niece  Code Status:  DNR Goals of care: Advanced Directive information Advanced Directives 09/16/2020  Does Patient Have a Medical Advance Directive? Yes  Type of Advance Directive [place]  Does patient want to make changes to medical advance directive? No - Patient declined  Copy of [place] in Chart? Yes - validated most recent copy scanned in chart (See row information)  Pre-existing out of facility DNR order (yellow form or pink MOST form) -     Chief Complaint  Patient presents with   Acute Visit    Complains of cut on right leg.    HPI:  Pt is a 87 y.o. female seen today for an acute visit for evaluation of right leg skin tear x 3 days.she is here with her care giver.She resides at [place].states patient was trying to get out of the recliner and sustained skin  tear.patient states was scratched by her walker.Has significant medical history of dementia HPI limited.Has been cleaning Skin tear with saline,and covering with non-adhesive gauze. No redness,odor or drainage noted.    Past Medical History:  Diagnosis Date   A-fib ([place])    Anemia    Anxiety    Dry skin    on ears   Gastroesophageal reflux    Hearing loss    significant  uses hearing aids   High blood pressure    Hyperlipidemia    Loss of appetite    Loss of smell    Macular degeneration    Malnutrition (HCC)    Osteoporosis     Persistent dry cough    Skin cancer    basal / squamous   Slow transit constipation    abstracted from new patient packet    Vitamin D deficiency 01/26/2018   Past Surgical History:  Procedure Laterality Date   FEMUR IM NAIL Right 12/28/2018   Procedure: INTRAMEDULLARY (IM) NAIL FEMORAL;  Surgeon: Dorna Leitz, MD;  Location: [facility];  Service: Orthopedics;  Laterality: Right;    No Known Allergies  Outpatient Encounter Medications as of 09/16/2020  Medication Sig   acetaminophen (TYLENOL) 325 MG tablet Take 650 mg by mouth daily. Mid-day   ALPRAZolam (XANAX) 0.25 MG tablet Take 0.25 mg by mouth every 6 (six) hours as needed for anxiety.   busPIRone (BUSPAR) 10 MG tablet Take 1 tablet (10 mg total) by mouth 3 (three) times daily.   Chloroxylenol-Zinc Oxide (BAZA EX) Apply 1 application topically as needed.   docusate sodium (COLACE) 100 MG capsule Take 100 mg by mouth 2 (two) times daily as needed for mild constipation.   Elastic Bandages & Supports (JOBST ACTIVE 20-30MMHG MEDIUM) MISC by Does not apply route. Provide every morning and remove every evening   Emollient (EUCERIN) lotion Apply topically as needed for dry skin. Spread topically to both legs once daily   famotidine (PEPCID) 20 MG tablet Take 1 tablet (20 mg total) by mouth at bedtime.   memantine (NAMENDA XR) 28 MG CP24 24 hr capsule Take 1 capsule (28 mg total) by mouth daily.  metoprolol succinate (TOPROL-XL) 25 MG 24 hr tablet Take 1 tablet (25 mg total) by mouth daily.   Multiple Vitamins-Minerals (EQ VISION FORMULA 50+ PO) Take 1 tablet by mouth every 12 (twelve) hours.   polyethylene glycol (MIRALAX / GLYCOLAX) 17 g packet Take 17 g by mouth daily. Mix 17gm in liquid and drink by mouth once daily as needed for constipation   potassium chloride (KLOR-CON) 20 MEQ tablet Take 1 tablet (20 mEq total) by mouth daily.   QUEtiapine (SEROQUEL) 25 MG tablet Take 25 mg by mouth at bedtime.   saccharomyces boulardii (FLORASTOR)  250 MG capsule Take 250 mg by mouth 2 (two) times daily.   sertraline (ZOLOFT) 50 MG tablet Take 75 mg by mouth daily.   torsemide (DEMADEX) 20 MG tablet Take 20 mg by mouth daily.   traMADol (ULTRAM) 50 MG tablet Take 50 mg by mouth every 6 (six) hours as needed (while awake as needed).   No facility-administered encounter medications on file as of 09/16/2020.    Review of Systems  Constitutional:  Negative for appetite change, chills, fatigue and fever.  Respiratory:  Negative for cough, chest tightness, shortness of breath and wheezing.   Cardiovascular:  Negative for chest pain, palpitations and leg swelling.  Musculoskeletal:  Positive for arthralgias and gait problem.  Skin:  Positive for wound. Negative for color change, pallor and rash.       Right leg skin tear   Neurological:  Negative for dizziness, weakness, light-headedness and headaches.   Immunization History  Administered Date(s) Administered   Fluad Quad(high Dose 65+) 12/14/2018   Influenza, High Dose Seasonal PF 01/02/2017, 02/21/2018   Influenza-Unspecified 12/17/2014   Moderna Sars-Covid-2 Vaccination 03/04/2019, 04/01/2019   Pneumococcal Conjugate-13 08/10/2016   Pneumococcal Polysaccharide-23 10/12/2010   Tdap 10/23/2006, 08/29/2018   Pertinent  Health Maintenance Due  Topic Date Due   DEXA SCAN  Never done   INFLUENZA VACCINE  10/05/2020   PNA vac Low Risk Adult  Completed   Fall Risk  09/16/2020 09/04/2020 06/02/2020 07/26/2019 06/27/2019  Falls in the past year? 0 0 0 1 0  Number falls in past yr: 0 0 0 1 0  Injury with Fall? 0 0 0 1 0  Risk for fall due to : No Fall Risks No Fall Risks - History of fall(s) -  Follow up Falls evaluation completed Falls evaluation completed - - -   Functional Status Survey:    Vitals:   09/16/20 1530  BP: 120/88  Pulse: 82  Resp: 16  Temp: 97.7 F (36.5 C)  SpO2: 96%  Weight: 140 lb (63.5 kg)  Height: 5\' 3"  (1.6 m)   Body mass index is 24.8 kg/m. Physical  Exam Constitutional:      General: She is not in acute distress.    Appearance: She is normal weight. She is not ill-appearing.  Cardiovascular:     Rate and Rhythm: Normal rate and regular rhythm.     Pulses: Normal pulses.     Heart sounds: Normal heart sounds. No murmur heard.   No friction rub. No gallop.  Pulmonary:     Effort: Pulmonary effort is normal. No respiratory distress.     Breath sounds: Normal breath sounds. No wheezing, rhonchi or rales.  Chest:     Chest wall: No tenderness.  Skin:    General: Skin is warm and dry.     Coloration: Skin is not pale.     Findings: No bruising or erythema.  Comments: Right lateral leg 5 cm X 5 cm superficial skin tear with 95 % of skin flap missing. 10% dark colored skin on 12 -2 O'clock position the rest of the wound bed is red without any signs of infection.wound bed cleansed with saline,pat,triple antibiotic ointment applied and covered with non-adhesive gauze then wrapped with Kerlix for protection.tolerated procedure well   Neurological:     Mental Status: She is alert. Mental status is at baseline.     Motor: No weakness.     Gait: Gait abnormal.    Labs reviewed: No results for input(s): NA, K, CL, CO2, GLUCOSE, BUN, CREATININE, CALCIUM, MG, PHOS in the last 8760 hours. No results for input(s): AST, ALT, ALKPHOS, BILITOT, PROT, ALBUMIN in the last 8760 hours. No results for input(s): WBC, NEUTROABS, HGB, HCT, MCV, PLT in the last 8760 hours. Lab Results  Component Value Date   TSH 1.49 11/28/2018   No results found for: HGBA1C Lab Results  Component Value Date   CHOL 116 09/06/2018   HDL 55 09/06/2018   LDLCALC 43 09/06/2018   TRIG 101 09/06/2018   CHOLHDL 2.1 09/06/2018    Significant Diagnostic Results in last 30 days:  No results found.  Assessment/Plan  Noninfected skin tear of right lower extremity, initial encounter Afebrile. Right lateral leg 5 cm X 5 cm superficial skin tear with 95 % of skin flap  missing. 10% dark colored skin on 12 -2 O'clock position the rest of the wound bed is red without any signs of infection.wound bed cleansed with saline,pat,triple antibiotic ointment applied and covered with non-adhesive gauze then wrapped with Kerlix for protection.tolerated procedure well.will need santyl gel if dark area not resolved with TAO. - will order Home health Nurse to change dressing until healed.May teach care giver to change the dressing when HHN not available.  - advised to notify provider with any signs and symptoms of infection. Facility form progress note completed and send with patient copy to be scanned on Epic. - Ambulatory referral to [facility] staff Communication: Reviewed plan of care with patient and care giver   Labs/tests ordered: None   Next Appointment: As needed if symptoms worsen or fail to improve    Sandrea Hughs, NP

## 2020-09-21 DIAGNOSIS — H3530 Unspecified macular degeneration: Secondary | ICD-10-CM | POA: Diagnosis not present

## 2020-09-21 DIAGNOSIS — E785 Hyperlipidemia, unspecified: Secondary | ICD-10-CM | POA: Diagnosis not present

## 2020-09-21 DIAGNOSIS — E46 Unspecified protein-calorie malnutrition: Secondary | ICD-10-CM

## 2020-09-21 DIAGNOSIS — M810 Age-related osteoporosis without current pathological fracture: Secondary | ICD-10-CM | POA: Diagnosis not present

## 2020-09-21 DIAGNOSIS — I129 Hypertensive chronic kidney disease with stage 1 through stage 4 chronic kidney disease, or unspecified chronic kidney disease: Secondary | ICD-10-CM

## 2020-09-21 DIAGNOSIS — H9190 Unspecified hearing loss, unspecified ear: Secondary | ICD-10-CM

## 2020-09-21 DIAGNOSIS — N181 Chronic kidney disease, stage 1: Secondary | ICD-10-CM

## 2020-09-21 DIAGNOSIS — S81811D Laceration without foreign body, right lower leg, subsequent encounter: Secondary | ICD-10-CM | POA: Diagnosis not present

## 2020-09-29 ENCOUNTER — Ambulatory Visit: Payer: Medicare Other | Admitting: Family Medicine

## 2020-09-30 ENCOUNTER — Encounter: Payer: Self-pay | Admitting: Nurse Practitioner

## 2020-09-30 ENCOUNTER — Other Ambulatory Visit: Payer: Self-pay

## 2020-09-30 ENCOUNTER — Ambulatory Visit: Payer: Medicare Other | Admitting: Nurse Practitioner

## 2020-09-30 VITALS — BP 124/80 | HR 70 | Temp 97.1°F | Ht 63.0 in | Wt 135.0 lb

## 2020-09-30 DIAGNOSIS — S81811D Laceration without foreign body, right lower leg, subsequent encounter: Secondary | ICD-10-CM

## 2020-09-30 DIAGNOSIS — M1990 Unspecified osteoarthritis, unspecified site: Secondary | ICD-10-CM

## 2020-09-30 DIAGNOSIS — I700 Atherosclerosis of aorta: Secondary | ICD-10-CM

## 2020-09-30 DIAGNOSIS — I10 Essential (primary) hypertension: Secondary | ICD-10-CM | POA: Diagnosis not present

## 2020-09-30 DIAGNOSIS — F0391 Unspecified dementia with behavioral disturbance: Secondary | ICD-10-CM

## 2020-09-30 DIAGNOSIS — E2839 Other primary ovarian failure: Secondary | ICD-10-CM

## 2020-09-30 DIAGNOSIS — M810 Age-related osteoporosis without current pathological fracture: Secondary | ICD-10-CM

## 2020-09-30 DIAGNOSIS — I480 Paroxysmal atrial fibrillation: Secondary | ICD-10-CM | POA: Diagnosis not present

## 2020-09-30 DIAGNOSIS — F0390 Unspecified dementia without behavioral disturbance: Secondary | ICD-10-CM

## 2020-09-30 DIAGNOSIS — F418 Other specified anxiety disorders: Secondary | ICD-10-CM

## 2020-09-30 LAB — CBC WITH DIFFERENTIAL/PLATELET
Absolute Monocytes: 688 cells/uL (ref 200–950)
Basophils Absolute: 32 cells/uL (ref 0–200)
Basophils Relative: 0.4 %
Eosinophils Absolute: 160 cells/uL (ref 15–500)
Eosinophils Relative: 2 %
HCT: 41.4 % (ref 35.0–45.0)
Hemoglobin: 12.8 g/dL (ref 11.7–15.5)
Lymphs Abs: 1904 cells/uL (ref 850–3900)
MCH: 25.5 pg — ABNORMAL LOW (ref 27.0–33.0)
MCHC: 30.9 g/dL — ABNORMAL LOW (ref 32.0–36.0)
MCV: 82.5 fL (ref 80.0–100.0)
MPV: 10.8 fL (ref 7.5–12.5)
Monocytes Relative: 8.6 %
Neutro Abs: 5216 cells/uL (ref 1500–7800)
Neutrophils Relative %: 65.2 %
Platelets: 257 10*3/uL (ref 140–400)
RBC: 5.02 10*6/uL (ref 3.80–5.10)
RDW: 12.7 % (ref 11.0–15.0)
Total Lymphocyte: 23.8 %
WBC: 8 10*3/uL (ref 3.8–10.8)

## 2020-09-30 LAB — COMPLETE METABOLIC PANEL WITH GFR
AG Ratio: 1.6 (calc) (ref 1.0–2.5)
ALT: 9 U/L (ref 6–29)
AST: 17 U/L (ref 10–35)
Albumin: 4.1 g/dL (ref 3.6–5.1)
Alkaline phosphatase (APISO): 113 U/L (ref 37–153)
BUN/Creatinine Ratio: 16 (calc) (ref 6–22)
BUN: 18 mg/dL (ref 7–25)
CO2: 27 mmol/L (ref 20–32)
Calcium: 9.5 mg/dL (ref 8.6–10.4)
Chloride: 101 mmol/L (ref 98–110)
Creat: 1.15 mg/dL — ABNORMAL HIGH (ref 0.60–0.95)
Globulin: 2.5 g/dL (calc) (ref 1.9–3.7)
Glucose, Bld: 109 mg/dL (ref 65–139)
Potassium: 4.2 mmol/L (ref 3.5–5.3)
Sodium: 139 mmol/L (ref 135–146)
Total Bilirubin: 0.6 mg/dL (ref 0.2–1.2)
Total Protein: 6.6 g/dL (ref 6.1–8.1)
eGFR: 44 mL/min/{1.73_m2} — ABNORMAL LOW (ref >=60)

## 2020-09-30 NOTE — Progress Notes (Signed)
Careteam: Patient Care Team: Jasmine Chandler, NP as PCP - General (Geriatric Medicine) Jasmine Schimke, MD as Referring Physician (Dermatology) Jasmine Robson, MD as Referring Physician (Ophthalmology) Jasmine Graff, DO as Consulting Physician (Orthopedic Surgery)  PLACE OF SERVICE:  [facility] Directive information Does Patient Have a Medical Advance Directive?: Yes, Type of Advance Directive: [place];Out of facility DNR (pink MOST or yellow form), Pre-existing out of facility DNR order (yellow form or pink MOST form): Yellow form placed in chart (order not valid for inpatient use), Does patient want to make changes to medical advance directive?: No - Patient declined  No Known Allergies  Chief Complaint  Patient presents with   Medical Management of Chronic Issues    4 month follow-up. Discuss need for dexa, shingrix, and covid #3 vaccine or exclude. Examine right leg. Memory loss and hallucinations. Here with caregiver, Jasmine Jones, and Niece, Jasmine Jones.      HPI: Patient is a 87 y.o. female for routine follow up.   Continues with ongoing memory loss. Memory worse. She has a Actuary during the day. Having hallucinations. Seeing her late husband.  Also seeing children.  No increase in anxiety or agitation.  Had urine check due to the hallucination but no growth on culture.   Dressing is sticking to dressing. Now using xeroform.   Swelling goes and comes from her legs.   Less active.   Very HOH  Review of Systems:  Review of Systems  Unable to perform ROS: Dementia   Past Medical History:  Diagnosis Date   A-fib ([place])    Anemia    Anxiety    Dry skin    on ears   Gastroesophageal reflux    Hearing loss    significant  uses hearing aids   High blood pressure    Hyperlipidemia    Loss of appetite    Loss of smell    Macular degeneration    Malnutrition (HCC)    Osteoporosis    Persistent dry cough    Skin cancer    basal /  squamous   Slow transit constipation    abstracted from new patient packet    Vitamin D deficiency 01/26/2018   Past Surgical History:  Procedure Laterality Date   FEMUR IM NAIL Right 12/28/2018   Procedure: INTRAMEDULLARY (IM) NAIL FEMORAL;  Surgeon: Dorna Leitz, MD;  Location: [facility];  Service: Orthopedics;  Laterality: Right;   Social History:   reports that she has never smoked. She has never used smokeless tobacco. She reports that she does not drink alcohol and does not use drugs.  Family History  Problem Relation Age of Onset   Atrial fibrillation Sister    Diabetes Sister    Osteoarthritis Sister    Osteopenia Sister    Mental retardation Sister     Medications: Patient's Medications  New Prescriptions   No medications on file  Previous Medications   ACETAMINOPHEN (TYLENOL) 325 MG TABLET    Take 650 mg by mouth daily. Mid-day   ALPRAZOLAM (XANAX) 0.25 MG TABLET    Take 0.25 mg by mouth every 6 (six) hours as needed for anxiety.   AMOXICILLIN (AMOXIL) 500 MG CAPSULE    Take 2,000 mg by mouth as directed. 1 hour prior to a dental appointment   BUSPIRONE (BUSPAR) 10 MG TABLET    Take 1 tablet (10 mg total) by mouth 3 (three) times daily.   CHLOROXYLENOL-ZINC OXIDE (BAZA EX)  Apply 1 application topically as needed.   DOCUSATE SODIUM (COLACE) 100 MG CAPSULE    Take 100 mg by mouth 2 (two) times daily as needed for mild constipation.   ELASTIC BANDAGES & SUPPORTS (JOBST ACTIVE 20-30MMHG MEDIUM) MISC    by Does not apply route. Provide every morning and remove every evening   EMOLLIENT (EUCERIN) LOTION    Apply topically as needed for dry skin. Spread topically to both legs once daily   FAMOTIDINE (PEPCID) 20 MG TABLET    Take 1 tablet (20 mg total) by mouth at bedtime.   MEMANTINE (NAMENDA XR) 28 MG CP24 24 HR CAPSULE    Take 1 capsule (28 mg total) by mouth daily.   METOPROLOL SUCCINATE (TOPROL-XL) 25 MG 24 HR TABLET    Take 1 tablet (25 mg total) by mouth daily.    MULTIPLE VITAMINS-MINERALS (EQ VISION FORMULA 50+ PO)    Take 1 tablet by mouth every 12 (twelve) hours.   POLYETHYLENE GLYCOL (MIRALAX / GLYCOLAX) 17 G PACKET    Take 17 g by mouth daily. Mix 17gm in liquid and drink by mouth once daily as needed for constipation   POTASSIUM CHLORIDE (KLOR-CON) 20 MEQ TABLET    Take 1 tablet (20 mEq total) by mouth daily.   QUETIAPINE (SEROQUEL) 25 MG TABLET    Take 25 mg by mouth at bedtime.   SACCHAROMYCES BOULARDII (FLORASTOR) 250 MG CAPSULE    Take 250 mg by mouth 2 (two) times daily.   SERTRALINE (ZOLOFT) 50 MG TABLET    Take 75 mg by mouth daily.   TORSEMIDE (DEMADEX) 20 MG TABLET    Take 20 mg by mouth daily.   TRAMADOL (ULTRAM) 50 MG TABLET    Take 50 mg by mouth every 6 (six) hours as needed (while awake as needed).  Modified Medications   No medications on file  Discontinued Medications   No medications on file    Physical Exam:  Vitals:   09/30/20 1504  BP: 124/80  Pulse: 70  Temp: (!) 97.1 F (36.2 C)  TempSrc: Temporal  SpO2: 95%  Weight: 135 lb (61.2 kg)  Height: '5\' 3"'$  (1.6 m)   Body mass index is 23.91 kg/m. Wt Readings from Last 3 Encounters:  09/30/20 135 lb (61.2 kg)  09/16/20 140 lb (63.5 kg)  06/02/20 129 lb 6.4 oz (58.7 kg)    Physical Exam Constitutional:      General: She is not in acute distress.    Appearance: She is well-developed. She is not diaphoretic.  HENT:     Head: Normocephalic and atraumatic.     Mouth/Throat:     Pharynx: No oropharyngeal exudate.  Eyes:     Conjunctiva/sclera: Conjunctivae normal.     Pupils: Pupils are equal, round, and reactive to light.  Cardiovascular:     Rate and Rhythm: Normal rate and regular rhythm.     Heart sounds: Normal heart sounds.  Pulmonary:     Effort: Pulmonary effort is normal.     Breath sounds: Normal breath sounds.  Abdominal:     General: Bowel sounds are normal.     Palpations: Abdomen is soft.  Musculoskeletal:     Cervical back: Normal range of  motion and neck supple.     Right lower leg: No edema.     Left lower leg: No edema.  Skin:    General: Skin is warm and dry.     Comments: Large skin tear to right lower leg.  Mild erythema surrounding area. No significant pain or heat.   Neurological:     Mental Status: She is alert. Mental status is at baseline.     Gait: Gait abnormal (slow gait, uses walker.).  Psychiatric:        Mood and Affect: Mood normal.    Labs reviewed: Basic Metabolic Panel: No results for input(s): NA, K, CL, CO2, GLUCOSE, BUN, CREATININE, CALCIUM, MG, PHOS, TSH in the last 8760 hours. Liver Function Tests: No results for input(s): AST, ALT, ALKPHOS, BILITOT, PROT, ALBUMIN in the last 8760 hours. No results for input(s): LIPASE, AMYLASE in the last 8760 hours. No results for input(s): AMMONIA in the last 8760 hours. CBC: No results for input(s): WBC, NEUTROABS, HGB, HCT, MCV, PLT in the last 8760 hours. Lipid Panel: No results for input(s): CHOL, HDL, LDLCALC, TRIG, CHOLHDL, LDLDIRECT in the last 8760 hours. TSH: No results for input(s): TSH in the last 8760 hours. A1C: No results found for: HGBA1C   Assessment/Plan 1. Paroxysmal atrial fibrillation (HCC) -rate controlled, continues on metoprolol for rate. Not on anticoagulation due to high fall risk  2. Essential hypertension Well controlled on metoprolol  - CBC with Differential/Platelet - COMPLETE METABOLIC PANEL WITH GFR  3. Estrogen deficiency - DG Bone Density; Future  4. Aortic atherosclerosis ([place]) Noted on imaging. Not on treatment due to age and dementia.  5. Dementia with behavioral disturbance, unspecified dementia type ([place]) Progressive decline, continues with caregiver during the day. Continues to live in assisted living and doing well with current level of care.   6. Age-related osteoporosis without current pathological fracture Previously on fosamax, not currently on treatment.   7. Osteoarthritis, unspecified  osteoarthritis type, unspecified site -stable at this time. Does not report pain.  8. Depression with anxiety Well controlled on current regimen.   9. Skin tear Dressing changed today. Wound stable without signs of infection. Continue xeroform dressing. To be changed every 3 days and PRN.    Next appt: 4 months Raylon Lamson K. Rob[place], [facility] [phone]

## 2020-10-07 ENCOUNTER — Encounter: Payer: Self-pay | Admitting: Nurse Practitioner

## 2020-10-14 ENCOUNTER — Encounter: Payer: Self-pay | Admitting: Nurse Practitioner

## 2020-11-20 DIAGNOSIS — M810 Age-related osteoporosis without current pathological fracture: Secondary | ICD-10-CM | POA: Diagnosis not present

## 2020-11-20 DIAGNOSIS — N181 Chronic kidney disease, stage 1: Secondary | ICD-10-CM

## 2020-11-20 DIAGNOSIS — H9190 Unspecified hearing loss, unspecified ear: Secondary | ICD-10-CM

## 2020-11-20 DIAGNOSIS — E46 Unspecified protein-calorie malnutrition: Secondary | ICD-10-CM

## 2020-11-20 DIAGNOSIS — H3530 Unspecified macular degeneration: Secondary | ICD-10-CM | POA: Diagnosis not present

## 2020-11-20 DIAGNOSIS — I129 Hypertensive chronic kidney disease with stage 1 through stage 4 chronic kidney disease, or unspecified chronic kidney disease: Secondary | ICD-10-CM

## 2020-11-20 DIAGNOSIS — E785 Hyperlipidemia, unspecified: Secondary | ICD-10-CM | POA: Diagnosis not present

## 2020-11-20 DIAGNOSIS — S81811D Laceration without foreign body, right lower leg, subsequent encounter: Secondary | ICD-10-CM | POA: Diagnosis not present

## 2020-12-10 ENCOUNTER — Telehealth: Payer: Self-pay

## 2020-12-10 ENCOUNTER — Other Ambulatory Visit: Payer: Self-pay

## 2020-12-10 ENCOUNTER — Ambulatory Visit (INDEPENDENT_AMBULATORY_CARE_PROVIDER_SITE_OTHER): Payer: Medicare Other | Admitting: Nurse Practitioner

## 2020-12-10 ENCOUNTER — Encounter: Payer: Self-pay | Admitting: Nurse Practitioner

## 2020-12-10 DIAGNOSIS — Z0000 Encounter for general adult medical examination without abnormal findings: Secondary | ICD-10-CM | POA: Diagnosis not present

## 2020-12-10 NOTE — Progress Notes (Signed)
This service is provided via telemedicine  No vital signs collected/recorded due to the encounter was a telemedicine visit.   Location of patient (ex: home, work):  Home  Patient consents to a telephone visit:  Yes, see encounter dated 12/10/2020  Location of the provider (ex: office, home):  [place]  Name of any referring provider:  N/A  Names of all persons participating in the telemedicine service and their role in the encounter:  Sherrie Mustache, Nurse Practitioner, [name], CMA, patient and caregiver, [name].   Time spent on call:  8 minutes with medical assistant

## 2020-12-10 NOTE — Progress Notes (Signed)
Subjective:   Bristyl Yannuzzi is a 87 y.o. female who presents for Medicare Annual (Subsequent) preventive examination.  Review of Systems     Cardiac Risk Factors include: advanced age (>52men, >56 women);sedentary lifestyle;hypertension     Objective:    There were no vitals filed for this visit. There is no height or weight on file to calculate BMI.  Advanced Directives 12/10/2020 09/30/2020 09/16/2020 09/04/2020 06/02/2020 06/27/2019 04/12/2019  Does Patient Have a Medical Advance Directive? Yes Yes Yes Yes Yes Yes Yes  Type of Advance Directive Out of facility DNR (pink MOST or yellow form);Healthcare Power of [place];Out of facility DNR (pink MOST or yellow form) Healthcare Power of [place] of facility DNR (pink MOST or yellow form) Out of facility DNR (pink MOST or yellow form);Healthcare Power of [place]  Does patient want to make changes to medical advance directive? - No - Patient declined No - Patient declined No - Patient declined No - Patient declined No - Patient declined -  Copy of [place] in Chart? No - copy requested Yes - validated most recent copy scanned in chart (See row information) Yes - validated most recent copy scanned in chart (See row information) Yes - validated most recent copy scanned in chart (See row information) - Yes - validated most recent copy scanned in chart (See row information) Yes - validated most recent copy scanned in chart (See row information)  Pre-existing out of facility DNR order (yellow form or pink MOST form) - Yellow form placed in chart (order not valid for inpatient use) - - Yellow form placed in chart (order not valid for inpatient use) - -    Current Medications (verified) Outpatient Encounter Medications as of 12/10/2020  Medication Sig   acetaminophen (TYLENOL) 325 MG tablet Take 650 mg by mouth daily. Mid-day   ALPRAZolam  (XANAX) 0.25 MG tablet Take 0.25 mg by mouth every 6 (six) hours as needed for anxiety.   amoxicillin (AMOXIL) 500 MG capsule Take 2,000 mg by mouth as directed. 1 hour prior to a dental appointment   busPIRone (BUSPAR) 10 MG tablet Take 1 tablet (10 mg total) by mouth 3 (three) times daily.   Chloroxylenol-Zinc Oxide (BAZA EX) Apply 1 application topically as needed.   docusate sodium (COLACE) 100 MG capsule Take 100 mg by mouth 2 (two) times daily as needed for mild constipation.   Elastic Bandages & Supports (JOBST ACTIVE 20-30MMHG MEDIUM) MISC by Does not apply route. Provide every morning and remove every evening   Emollient (EUCERIN) lotion Apply topically as needed for dry skin. Spread topically to both legs once daily   famotidine (PEPCID) 20 MG tablet Take 1 tablet (20 mg total) by mouth at bedtime.   memantine (NAMENDA XR) 28 MG CP24 24 hr capsule Take 1 capsule (28 mg total) by mouth daily.   metoprolol succinate (TOPROL-XL) 25 MG 24 hr tablet Take 1 tablet (25 mg total) by mouth daily.   Multiple Vitamins-Minerals (EQ VISION FORMULA 50+ PO) Take 1 tablet by mouth every 12 (twelve) hours.   polyethylene glycol (MIRALAX / GLYCOLAX) 17 g packet Take 17 g by mouth daily. Mix 17gm in liquid and drink by mouth once daily as needed for constipation   potassium chloride (KLOR-CON) 20 MEQ tablet Take 1 tablet (20 mEq total) by mouth daily.   QUEtiapine (SEROQUEL) 25 MG tablet Take 25 mg by mouth at bedtime.  saccharomyces boulardii (FLORASTOR) 250 MG capsule Take 250 mg by mouth 2 (two) times daily.   sertraline (ZOLOFT) 50 MG tablet Take 75 mg by mouth daily.   torsemide (DEMADEX) 20 MG tablet Take 20 mg by mouth daily.   traMADol (ULTRAM) 50 MG tablet Take 50 mg by mouth every 6 (six) hours as needed (while awake as needed).   No facility-administered encounter medications on file as of 12/10/2020.    Allergies (verified) Patient has no known allergies.   History: Past Medical History:   Diagnosis Date   A-fib ([place])    Anemia    Anxiety    Dry skin    on ears   Gastroesophageal reflux    Hearing loss    significant  uses hearing aids   High blood pressure    Hyperlipidemia    Loss of appetite    Loss of smell    Macular degeneration    Malnutrition ([facility])    Osteoporosis    Persistent dry cough    Skin cancer    basal / squamous   Slow transit constipation    abstracted from new patient packet    Vitamin D deficiency 01/26/2018   Past Surgical History:  Procedure Laterality Date   FEMUR IM NAIL Right 12/28/2018   Procedure: INTRAMEDULLARY (IM) NAIL FEMORAL;  Surgeon: Dorna Leitz, MD;  Location: [facility];  Service: Orthopedics;  Laterality: Right;   Family History  Problem Relation Age of Onset   Atrial fibrillation Sister    Diabetes Sister    Osteoarthritis Sister    Osteopenia Sister    Mental retardation Sister    Social History   Socioeconomic History   Marital status: Widowed    Spouse name: Not on file   Number of children: 0   Years of education: high school   Highest education level: 12th grade  Occupational History   Not on file  Tobacco Use   Smoking status: Never   Smokeless tobacco: Never  Vaping Use   Vaping Use: Never used  Substance and Sexual Activity   Alcohol use: No    Alcohol/week: 0.0 standard drinks   Drug use: No   Sexual activity: Not Currently  Other Topics Concern   Not on file  Social History Narrative   Social History      Diet? No restrictions      Do you drink/eat things with caffeine? Yes coffee      Marital status?          widow                          What year were you married?  ?      Do you live in a house, apartment, assisted living, condo, trailer, etc.? Condo - assisted living since September 2019      Is it one or more stories? 1      How many persons live in your home? 1      Do you have any pets in your home? (please list) no      Highest level of education completed? RN degree       Current or past profession: RN      Do you exercise?  Prior to fall Aug 2019 routine exercise  Type & how often? 2 x daily        Advanced Directives      Do you have a living will? yes      Do you have a DNR form?                                  If not, do you want to discuss one?   Yes I think       Do you have signed POA/HPOA for forms? Yes       Functional Status      Do you have difficulty bathing or dressing yourself?  Yes does her bathing usually assisted with nursing       Do you have difficulty preparing food or eating? Yes food prep       Do you have difficulty managing your medications? No prior to fall has not been managing since      Do you have difficulty managing your finances? No       Do you have difficulty affording your medications? No       Social Determinants of Radio broadcast assistant Strain: Not on file  Food Insecurity: Not on file  Transportation Needs: Not on file  Physical Activity: Not on file  Stress: Not on file  Social Connections: Not on file    Tobacco Counseling Counseling given: Not Answered   Clinical Intake:     Pain : No/denies pain     BMI - recorded: 23 Nutritional Status: BMI of 19-24  Normal Diabetes: No  How often do you need to have someone help you when you read instructions, pamphlets, or other written materials from your doctor or pharmacy?: 5 - [place]         Activities of Daily Living In your present state of health, do you have any difficulty performing the following activities: 12/10/2020  Hearing? Y  Vision? Y  Difficulty concentrating or making decisions? Y  Walking or climbing stairs? Y  Dressing or bathing? Y  Doing errands, shopping? Y  Preparing Food and eating ? Y  Comment needs cueing, does not prepare food  Using the Toilet? N  In the past six months, have you accidently leaked urine? Y  Managing your Medications? Y  Managing your  Finances? Y  Housekeeping or managing your Housekeeping? Y  Some recent data might be hidden    Patient Care Team: [name], NP as PCP - General (Geriatric Medicine) Jolene Schimke, MD as Referring Physician (Dermatology) Birder Robson, MD as Referring Physician (Ophthalmology) Jaymes Graff, DO as Consulting Physician (Orthopedic Surgery)  Indicate any recent Medical Services you may have received from other than [facility] providers in the past year (date may be approximate).     Assessment:   This is a routine wellness examination for [place].  Hearing/Vision screen Hearing Screening - Comments:: Patient has hearing problems. Patient wears hearing aids that don't seem to help  Vision Screening - Comments:: Macular degeneration. Patient has not had eye exam for about 4 years.  Dietary issues and exercise activities discussed: Current Exercise Habits: The patient does not participate in regular exercise at present   Goals Addressed   None    Depression Screen PHQ 2/9 Scores 12/10/2020 07/26/2019  PHQ - 2 Score 0 0    Fall Risk Fall Risk  12/10/2020 09/30/2020 09/16/2020 09/04/2020 06/02/2020  Falls in the past  year? 0 0 0 0 0  Number falls in past yr: 0 0 0 0 0  Injury with Fall? 0 0 0 0 0  Risk for fall due to : No Fall Risks No Fall Risks No Fall Risks No Fall Risks -  Follow up Falls evaluation completed Falls evaluation completed Falls evaluation completed Falls evaluation completed -    FALL RISK PREVENTION PERTAINING TO THE HOME:  Any stairs in or around the home? No  If so, are there any without handrails? No  Home free of loose throw rugs in walkways, pet beds, electrical cords, etc? Yes  Adequate lighting in your home to reduce risk of falls? Yes   ASSISTIVE DEVICES UTILIZED TO PREVENT FALLS:  Life alert? Yes  Use of a cane, walker or w/c? Yes  Grab bars in the bathroom? Yes  Shower chair or bench in shower? Yes  Elevated toilet seat or a  handicapped toilet? Yes   TIMED UP AND GO:  Was the test performed? No .   Cognitive Function:        Immunizations Immunization History  Administered Date(s) Administered   Fluad Quad(high Dose 65+) 12/14/2018, 11/12/2020   Influenza, High Dose Seasonal PF 01/02/2017, 02/21/2018   Influenza-Unspecified 12/17/2014   Moderna SARS-COV2 Booster Vaccination 01/02/2020   Moderna Sars-Covid-2 Vaccination 03/04/2019, 04/01/2019   Pneumococcal Conjugate-13 08/10/2016   Pneumococcal Polysaccharide-23 10/12/2010   Tdap 10/23/2006, 08/29/2018    TDAP status: Up to date  Flu Vaccine status: Up to date  Pneumococcal vaccine status: Up to date  Covid-19 vaccine status: Information provided on how to obtain vaccines.   Qualifies for Shingles Vaccine? Yes   Zostavax completed No   Shingrix Completed?: No.    Education has been provided regarding the importance of this vaccine. Patient has been advised to call insurance company to determine out of pocket expense if they have not yet received this vaccine. Advised may also receive vaccine at local pharmacy or Health Dept. Verbalized acceptance and understanding.  Screening Tests Health Maintenance  Topic Date Due   Zoster Vaccines- Shingrix (1 of 2) Never done   DEXA SCAN  Never done   COVID-19 Vaccine (4 - Booster for Moderna series) 03/26/2020   TETANUS/TDAP  08/28/2028   INFLUENZA VACCINE  Completed   HPV VACCINES  Aged Out    Health Maintenance  Health Maintenance Due  Topic Date Due   Zoster Vaccines- Shingrix (1 of 2) Never done   DEXA SCAN  Never done   COVID-19 Vaccine (4 - Booster for Moderna series) 03/26/2020    Colorectal cancer screening: No longer required.   Mammogram status: No longer required due to age.  Bone Density status: Ordered and scheduled. Pt provided with contact info and advised to call to schedule appt.  Lung Cancer Screening: (Low Dose CT Chest recommended if Age 19-80 years, 30 pack-year  currently smoking OR have quit w/in 15years.) does not qualify.   Lung Cancer Screening Referral: na  Additional Screening:  Hepatitis C Screening: does not qualify;   Vision Screening: Recommended annual ophthalmology exams for early detection of glaucoma and other disorders of the eye. Is the patient up to date with their annual eye exam?  No  Who is the provider or what is the name of the office in which the patient attends annual eye exams? na If pt is not established with a provider, would they like to be referred to a provider to establish care? No .   Dental  Screening: Recommended annual dental exams for proper oral hygiene  Community Resource Referral / Chronic Care Management: CRR required this visit?  No   CCM required this visit?  No      Plan:     I have personally reviewed and noted the following in the patient's chart:   Medical and social history Use of alcohol, tobacco or illicit drugs  Current medications and supplements including opioid prescriptions.  Functional ability and status Nutritional status Physical activity Advanced directives List of other physicians Hospitalizations, surgeries, and ER visits in previous 12 months Vitals Screenings to include cognitive, depression, and falls Referrals and appointments  In addition, I have reviewed and discussed with patient certain preventive protocols, quality metrics, and best practice recommendations. A written personalized care plan for preventive services as well as general preventive health recommendations were provided to patient.     [name], NP   12/10/2020    Virtual Visit via Telephone Note  I connected withNAME@ on 12/10/20 at 11:30 AM EDT by telephone and verified that I am speaking with the correct person using two identifiers.  Location: Patient: home Provider: [facility]    I discussed the limitations, risks, security and privacy concerns of performing an evaluation and  management service by telephone and the availability of in person appointments. I also discussed with the patient that there may be a patient responsible charge related to this service. The patient expressed understanding and agreed to proceed.   I discussed the assessment and treatment plan with the patient. The patient was provided an opportunity to ask questions and all were answered. The patient agreed with the plan and demonstrated an understanding of the instructions.   The patient was advised to call back or seek an in-person evaluation if the symptoms worsen or if the condition fails to improve as anticipated.  I provided 15 minutes of non-face-to-face time during this encounter.  [employer]. Harle [employer] printed and mailed

## 2020-12-10 NOTE — Patient Instructions (Signed)
Jasmine Jones , Thank you for taking time to come for your Medicare Wellness Visit. I appreciate your ongoing commitment to your health goals. Please review the following plan we discussed and let me know if I can assist you in the future.   Screening recommendations/referrals: Colonoscopy aged out Mammogram aged out Bone Density -scheduled.  Recommended yearly ophthalmology/optometry visit for glaucoma screening and checkup Recommended yearly dental visit for hygiene and checkup  Vaccinations: Influenza vaccine up to date Pneumococcal vaccine up to date Tdap vaccine up to date Shingles vaccine RECOMMENDED to get at local pharmacy COVID booster- recommended at this time.     Advanced directives: on file.   Conditions/risks identified: advanced age, at risk for worsening memory loss, weight loss  Next appointment: yearly    Preventive Care 43 Years and Older, Female Preventive care refers to lifestyle choices and visits with your health care provider that can promote health and wellness. What does preventive care include? A yearly physical exam. This is also called an annual well check. Dental exams once or twice a year. Routine eye exams. Ask your health care provider how often you should have your eyes checked. Personal lifestyle choices, including: Daily care of your teeth and gums. Regular physical activity. Eating a healthy diet. Avoiding tobacco and drug use. Limiting alcohol use. Practicing safe sex. Taking low-dose aspirin every day. Taking vitamin and mineral supplements as recommended by your health care provider. What happens during an annual well check? The services and screenings done by your health care provider during your annual well check will depend on your age, overall health, lifestyle risk factors, and family history of disease. Counseling  Your health care provider may ask you questions about your: Alcohol use. Tobacco use. Drug use. Emotional  well-being. Home and relationship well-being. Sexual activity. Eating habits. History of falls. Memory and ability to understand (cognition). Work and work Statistician. Reproductive health. Screening  You may have the following tests or measurements: Height, weight, and BMI. Blood pressure. Lipid and cholesterol levels. These may be checked every 5 years, or more frequently if you are over 72 years old. Skin check. Lung cancer screening. You may have this screening every year starting at age 39 if you have a 30-pack-year history of smoking and currently smoke or have quit within the past 15 years. Fecal occult blood test (FOBT) of the stool. You may have this test every year starting at age 54. Flexible sigmoidoscopy or colonoscopy. You may have a sigmoidoscopy every 5 years or a colonoscopy every 10 years starting at age 62. Hepatitis C blood test. Hepatitis B blood test. Sexually transmitted disease (STD) testing. Diabetes screening. This is done by checking your blood sugar (glucose) after you have not eaten for a while (fasting). You may have this done every 1-3 years. Bone density scan. This is done to screen for osteoporosis. You may have this done starting at age 16. Mammogram. This may be done every 1-2 years. Talk to your health care provider about how often you should have regular mammograms. Talk with your health care provider about your test results, treatment options, and if necessary, the need for more tests. Vaccines  Your health care provider may recommend certain vaccines, such as: Influenza vaccine. This is recommended every year. Tetanus, diphtheria, and acellular pertussis (Tdap, Td) vaccine. You may need a Td booster every 10 years. Zoster vaccine. You may need this after age 83. Pneumococcal 13-valent conjugate (PCV13) vaccine. One dose is recommended after age 23. Pneumococcal  polysaccharide (PPSV23) vaccine. One dose is recommended after age 71. Talk to your  health care provider about which screenings and vaccines you need and how often you need them. This information is not intended to replace advice given to you by your health care provider. Make sure you discuss any questions you have with your health care provider. Document Released: 03/20/2015 Document Revised: 11/11/2015 Document Reviewed: 12/23/2014 Elsevier Interactive Patient Education  2017 [employer] Prevention in the Home Falls can cause injuries. They can happen to people of all ages. There are many things you can do to make your home safe and to help prevent falls. What can I do on the outside of my home? Regularly fix the edges of walkways and driveways and fix any cracks. Remove anything that might make you trip as you walk through a door, such as a raised step or threshold. Trim any bushes or trees on the path to your home. Use bright outdoor lighting. Clear any walking paths of anything that might make someone trip, such as rocks or tools. Regularly check to see if handrails are loose or broken. Make sure that both sides of any steps have handrails. Any raised decks and porches should have guardrails on the edges. Have any leaves, snow, or ice cleared regularly. Use sand or salt on walking paths during winter. Clean up any spills in your garage right away. This includes oil or grease spills. What can I do in the bathroom? Use night lights. Install grab bars by the toilet and in the tub and shower. Do not use towel bars as grab bars. Use non-skid mats or decals in the tub or shower. If you need to sit down in the shower, use a plastic, non-slip stool. Keep the floor dry. Clean up any water that spills on the floor as soon as it happens. Remove soap buildup in the tub or shower regularly. Attach bath mats securely with double-sided non-slip rug tape. Do not have throw rugs and other things on the floor that can make you trip. What can I do in the bedroom? Use night  lights. Make sure that you have a light by your bed that is easy to reach. Do not use any sheets or blankets that are too big for your bed. They should not hang down onto the floor. Have a firm chair that has side arms. You can use this for support while you get dressed. Do not have throw rugs and other things on the floor that can make you trip. What can I do in the kitchen? Clean up any spills right away. Avoid walking on wet floors. Keep items that you use a lot in easy-to-reach places. If you need to reach something above you, use a strong step stool that has a grab bar. Keep electrical cords out of the way. Do not use floor polish or wax that makes floors slippery. If you must use wax, use non-skid floor wax. Do not have throw rugs and other things on the floor that can make you trip. What can I do with my stairs? Do not leave any items on the stairs. Make sure that there are handrails on both sides of the stairs and use them. Fix handrails that are broken or loose. Make sure that handrails are as long as the stairways. Check any carpeting to make sure that it is firmly attached to the stairs. Fix any carpet that is loose or worn. Avoid having throw rugs at the top  or bottom of the stairs. If you do have throw rugs, attach them to the floor with carpet tape. Make sure that you have a light switch at the top of the stairs and the bottom of the stairs. If you do not have them, ask someone to add them for you. What else can I do to help prevent falls? Wear shoes that: Do not have high heels. Have rubber bottoms. Are comfortable and fit you well. Are closed at the toe. Do not wear sandals. If you use a stepladder: Make sure that it is fully opened. Do not climb a closed stepladder. Make sure that both sides of the stepladder are locked into place. Ask someone to hold it for you, if possible. Clearly mark and make sure that you can see: Any grab bars or handrails. First and last  steps. Where the edge of each step is. Use tools that help you move around (mobility aids) if they are needed. These include: Canes. Walkers. Scooters. Crutches. Turn on the lights when you go into a dark area. Replace any light bulbs as soon as they burn out. Set up your furniture so you have a clear path. Avoid moving your furniture around. If any of your floors are uneven, fix them. If there are any pets around you, be aware of where they are. Review your medicines with your doctor. Some medicines can make you feel dizzy. This can increase your chance of falling. Ask your doctor what other things that you can do to help prevent falls. This information is not intended to replace advice given to you by your health care provider. Make sure you discuss any questions you have with your health care provider. Document Released: 12/18/2008 Document Revised: 07/30/2015 Document Reviewed: 03/28/2014 Elsevier Interactive Patient Education  2017 [employer].

## 2020-12-10 NOTE — Telephone Encounter (Signed)
.  [name], biggs are scheduled for a virtual visit with your provider today.    Just as we do with appointments in the office, we must obtain your consent to participate.  Your consent will be active for this visit and any virtual visit you may have with one of our providers in the next 365 days.    If you have a MyChart account, I can also send a copy of this consent to you electronically.  All virtual visits are billed to your insurance company just like a traditional visit in the office.  As this is a virtual visit, video technology does not allow for your provider to perform a traditional examination.  This may limit your provider's ability to fully assess your condition.  If your provider identifies any concerns that need to be evaluated in person or the need to arrange testing such as labs, EKG, etc, we will make arrangements to do so.    Although advances in technology are sophisticated, we cannot ensure that it will always work on either your end or our end.  If the connection with a video visit is poor, we may have to switch to a telephone visit.  With either a video or telephone visit, we are not always able to ensure that we have a secure connection.   I need to obtain your verbal consent now.   Are you willing to proceed with your visit today?   Jasmine Jones has provided verbal consent on 12/10/2020 for a virtual visit (video or telephone).   [name], CMA 12/10/2020  11:42 AM

## 2020-12-29 ENCOUNTER — Encounter: Payer: Self-pay | Admitting: Nurse Practitioner

## 2021-01-01 ENCOUNTER — Telehealth: Payer: Self-pay

## 2021-01-01 NOTE — Telephone Encounter (Addendum)
Called returned to Jasmine Jones, message taken by receptionist that verbal order approved, and if any questions please call.   Jasmine Jones was on another call.

## 2021-01-01 NOTE — Telephone Encounter (Signed)
Amy [phone]) is requesting orders to be added to patient's nursing for PT/OT eval and treatment. Verbal orders are fine.

## 2021-01-01 NOTE — Telephone Encounter (Signed)
Okay to add

## 2021-01-07 ENCOUNTER — Telehealth: Payer: Self-pay | Admitting: *Deleted

## 2021-01-07 NOTE — Telephone Encounter (Signed)
Jasmine Jones with Jasmine Jones called needing verbal orders for [id number] to recert patient.   Verbal orders given.

## 2021-01-08 ENCOUNTER — Telehealth: Payer: Self-pay | Admitting: *Deleted

## 2021-01-08 NOTE — Telephone Encounter (Signed)
Deidre Ala with [facility] called requesting verbal orders for PT 1X3weeks.   Verbal orders given.

## 2021-01-15 ENCOUNTER — Telehealth: Payer: Self-pay | Admitting: *Deleted

## 2021-01-15 NOTE — Telephone Encounter (Signed)
Melissa with [facility] called requesting verbal orders for OT 1x8weeks.  Verbal orders given.

## 2021-01-18 ENCOUNTER — Telehealth: Payer: Self-pay | Admitting: *Deleted

## 2021-01-18 NOTE — Telephone Encounter (Signed)
Jasmine Jones with [facility] called requesting verbal orders for Home Health PT 1X6weeks.  Verbal orders given.

## 2021-02-01 ENCOUNTER — Encounter: Payer: Self-pay | Admitting: Nurse Practitioner

## 2021-02-01 ENCOUNTER — Other Ambulatory Visit: Payer: Self-pay

## 2021-02-01 ENCOUNTER — Ambulatory Visit: Payer: Medicare Other | Admitting: Nurse Practitioner

## 2021-02-01 VITALS — BP 134/82 | HR 65 | Temp 97.7°F | Ht 63.0 in | Wt 137.6 lb

## 2021-02-01 DIAGNOSIS — F418 Other specified anxiety disorders: Secondary | ICD-10-CM | POA: Diagnosis not present

## 2021-02-01 DIAGNOSIS — F03C4 Unspecified dementia, severe, with anxiety: Secondary | ICD-10-CM

## 2021-02-01 DIAGNOSIS — I10 Essential (primary) hypertension: Secondary | ICD-10-CM

## 2021-02-01 DIAGNOSIS — M1990 Unspecified osteoarthritis, unspecified site: Secondary | ICD-10-CM

## 2021-02-01 DIAGNOSIS — S81811D Laceration without foreign body, right lower leg, subsequent encounter: Secondary | ICD-10-CM

## 2021-02-01 DIAGNOSIS — I480 Paroxysmal atrial fibrillation: Secondary | ICD-10-CM

## 2021-02-01 DIAGNOSIS — R600 Localized edema: Secondary | ICD-10-CM

## 2021-02-01 DIAGNOSIS — R441 Visual hallucinations: Secondary | ICD-10-CM

## 2021-02-01 LAB — COMPLETE METABOLIC PANEL WITH GFR
AG Ratio: 1.7 (calc) (ref 1.0–2.5)
ALT: 9 U/L (ref 6–29)
AST: 16 U/L (ref 10–35)
Albumin: 4.1 g/dL (ref 3.6–5.1)
Alkaline phosphatase (APISO): 105 U/L (ref 37–153)
BUN: 24 mg/dL (ref 7–25)
CO2: 30 mmol/L (ref 20–32)
Calcium: 9.2 mg/dL (ref 8.6–10.4)
Chloride: 102 mmol/L (ref 98–110)
Creat: 0.83 mg/dL (ref 0.60–0.95)
Globulin: 2.4 g/dL (calc) (ref 1.9–3.7)
Glucose, Bld: 84 mg/dL (ref 65–139)
Potassium: 4.2 mmol/L (ref 3.5–5.3)
Sodium: 141 mmol/L (ref 135–146)
Total Bilirubin: 0.6 mg/dL (ref 0.2–1.2)
Total Protein: 6.5 g/dL (ref 6.1–8.1)
eGFR: 66 mL/min/{1.73_m2} (ref >=60)

## 2021-02-01 LAB — CBC WITH DIFFERENTIAL/PLATELET
Absolute Monocytes: 574 cells/uL (ref 200–950)
Basophils Absolute: 21 cells/uL (ref 0–200)
Basophils Relative: 0.3 %
Eosinophils Absolute: 98 cells/uL (ref 15–500)
Eosinophils Relative: 1.4 %
HCT: 40.6 % (ref 35.0–45.0)
Hemoglobin: 13 g/dL (ref 11.7–15.5)
Lymphs Abs: 1953 cells/uL (ref 850–3900)
MCH: 26 pg — ABNORMAL LOW (ref 27.0–33.0)
MCHC: 32 g/dL (ref 32.0–36.0)
MCV: 81.2 fL (ref 80.0–100.0)
MPV: 11.4 fL (ref 7.5–12.5)
Monocytes Relative: 8.2 %
Neutro Abs: 4354 cells/uL (ref 1500–7800)
Neutrophils Relative %: 62.2 %
Platelets: 220 10*3/uL (ref 140–400)
RBC: 5 10*6/uL (ref 3.80–5.10)
RDW: 13.4 % (ref 11.0–15.0)
Total Lymphocyte: 27.9 %
WBC: 7 10*3/uL (ref 3.8–10.8)

## 2021-02-01 NOTE — Progress Notes (Signed)
Careteam: Patient Care Team: [name], NP as PCP - General (Geriatric Medicine) Jolene Schimke, MD as Referring Physician (Dermatology) Birder Robson, MD as Referring Physician (Ophthalmology) Jaymes Graff, DO as Consulting Physician (Orthopedic Surgery)  PLACE OF SERVICE:  [place] Directive information    No Known Allergies  Chief Complaint  Patient presents with   Medical Management of Chronic Issues    Patient presents today for a 4 month follow-up.   Quality Metric Gaps    DEXA scan and COVID booster     HPI: Patient is a 87 y.o. female for routine follow up.   Worsening memory loss, does not remember when she has just eaten. Reports she is seeing things and talking to people that are not there.  She thinks there are dogs in the room.  A lot of the times she is laughing when she is talking about it.   Up at night and sleeping during the day. She is taking seroquel 25 mg by mouth at bedtime. [name] reports she is out of her routine.   Anxiety- overall controlled on zoloft and buspar. Sometimes does not like leaving out and sometimes will refuse appts. [name] taking it one day at the time.   Therapy working with her on balance. She has a fear of falling. She will have an OT consult. Lots of arthritis in her hands and arms.   LE edema- compression hose on today. She continues to have a small wound on the ankle. Nursing following area. Using a baidaid. Almost completely healed.  Using torsemide 20 mg daily    Review of Systems:  Review of Systems  Constitutional:  Negative for chills, fever and weight loss.  HENT:  Negative for tinnitus.   Respiratory:  Negative for cough, sputum production and shortness of breath.   Cardiovascular:  Negative for chest pain and palpitations.  Gastrointestinal:  Negative for abdominal pain, constipation, diarrhea and heartburn.  Genitourinary:  Negative for dysuria, frequency and urgency.   Musculoskeletal:  Positive for joint pain. Negative for back pain, falls and myalgias.  Skin: Negative.   Neurological:  Negative for dizziness and headaches.  Psychiatric/Behavioral:  Positive for hallucinations and memory loss. Negative for depression. The patient is nervous/anxious and has insomnia.    Past Medical History:  Diagnosis Date   A-fib ([place])    Anemia    Anxiety    Dry skin    on ears   Gastroesophageal reflux    Hearing loss    significant  uses hearing aids   High blood pressure    Hyperlipidemia    Loss of appetite    Loss of smell    Macular degeneration    Malnutrition (HCC)    Osteoporosis    Persistent dry cough    Skin cancer    basal / squamous   Slow transit constipation    abstracted from new patient packet    Vitamin D deficiency 01/26/2018   Past Surgical History:  Procedure Laterality Date   FEMUR IM NAIL Right 12/28/2018   Procedure: INTRAMEDULLARY (IM) NAIL FEMORAL;  Surgeon: Dorna Leitz, MD;  Location: [facility];  Service: Orthopedics;  Laterality: Right;   Social History:   reports that she has never smoked. She has never used smokeless tobacco. She reports that she does not drink alcohol and does not use drugs.  Family History  Problem Relation Age of Onset   Atrial fibrillation Sister    Diabetes Sister  Osteoarthritis Sister    Osteopenia Sister    Mental retardation Sister     Medications: Patient's Medications  New Prescriptions   No medications on file  Previous Medications   ACETAMINOPHEN (TYLENOL) 325 MG TABLET    Take 650 mg by mouth daily. Mid-day   ALPRAZOLAM (XANAX) 0.25 MG TABLET    Take 0.25 mg by mouth every 6 (six) hours as needed for anxiety.   AMOXICILLIN (AMOXIL) 500 MG CAPSULE    Take 2,000 mg by mouth as directed. 1 hour prior to a dental appointment   BUSPIRONE (BUSPAR) 10 MG TABLET    Take 1 tablet (10 mg total) by mouth 3 (three) times daily.   CHLOROXYLENOL-ZINC OXIDE (BAZA EX)    Apply 1 application  topically as needed.   DOCUSATE SODIUM (COLACE) 100 MG CAPSULE    Take 100 mg by mouth 2 (two) times daily as needed for mild constipation.   ELASTIC BANDAGES & SUPPORTS (JOBST ACTIVE 20-30MMHG MEDIUM) MISC    by Does not apply route. Provide every morning and remove every evening   EMOLLIENT (EUCERIN) LOTION    Apply topically as needed for dry skin. Spread topically to both legs once daily   FAMOTIDINE (PEPCID) 20 MG TABLET    Take 1 tablet (20 mg total) by mouth at bedtime.   MEMANTINE (NAMENDA XR) 28 MG CP24 24 HR CAPSULE    Take 1 capsule (28 mg total) by mouth daily.   METOPROLOL SUCCINATE (TOPROL-XL) 25 MG 24 HR TABLET    Take 1 tablet (25 mg total) by mouth daily.   MULTIPLE VITAMINS-MINERALS (EQ VISION FORMULA 50+ PO)    Take 1 tablet by mouth every 12 (twelve) hours.   POLYETHYLENE GLYCOL (MIRALAX / GLYCOLAX) 17 G PACKET    Take 17 g by mouth daily. Mix 17gm in liquid and drink by mouth once daily as needed for constipation   POTASSIUM CHLORIDE (KLOR-CON) 20 MEQ TABLET    Take 1 tablet (20 mEq total) by mouth daily.   QUETIAPINE (SEROQUEL) 25 MG TABLET    Take 25 mg by mouth at bedtime.   SACCHAROMYCES BOULARDII (FLORASTOR) 250 MG CAPSULE    Take 250 mg by mouth 2 (two) times daily.   SERTRALINE (ZOLOFT) 50 MG TABLET    Take 75 mg by mouth daily.   TORSEMIDE (DEMADEX) 20 MG TABLET    Take 20 mg by mouth daily.   TRAMADOL (ULTRAM) 50 MG TABLET    Take 50 mg by mouth every 6 (six) hours as needed (while awake as needed).  Modified Medications   No medications on file  Discontinued Medications   No medications on file    Physical Exam:  Vitals:   02/01/21 1247  BP: 134/82  Pulse: 65  Temp: 97.7 F (36.5 C)  SpO2: 97%  Weight: 137 lb 9.6 oz (62.4 kg)  Height: $Remove'5\' 3"'KKyyTHa$  (1.6 m)   Body mass index is 24.37 kg/m. Wt Readings from Last 3 Encounters:  02/01/21 137 lb 9.6 oz (62.4 kg)  09/30/20 135 lb (61.2 kg)  09/16/20 140 lb (63.5 kg)    Physical Exam Constitutional:       General: She is not in acute distress.    Appearance: She is well-developed. She is not diaphoretic.  HENT:     Head: Normocephalic and atraumatic.     Mouth/Throat:     Pharynx: No oropharyngeal exudate.  Eyes:     Conjunctiva/sclera: Conjunctivae normal.     Pupils: Pupils  are equal, round, and reactive to light.  Cardiovascular:     Rate and Rhythm: Normal rate and regular rhythm.     Heart sounds: Normal heart sounds.  Pulmonary:     Effort: Pulmonary effort is normal.     Breath sounds: Normal breath sounds.  Abdominal:     General: Bowel sounds are normal.     Palpations: Abdomen is soft.  Musculoskeletal:     Cervical back: Normal range of motion and neck supple.     Right lower leg: Edema (1+) present.     Left lower leg: Edema (1+) present.  Skin:    General: Skin is warm and dry.  Neurological:     Mental Status: She is alert.  Psychiatric:        Mood and Affect: Mood normal.    Labs reviewed: Basic Metabolic Panel: Recent Labs    09/30/20 1609  NA 139  K 4.2  CL 101  CO2 27  GLUCOSE 109  BUN 18  CREATININE 1.15*  CALCIUM 9.5   Liver Function Tests: Recent Labs    09/30/20 1609  AST 17  ALT 9  BILITOT 0.6  PROT 6.6   No results for input(s): LIPASE, AMYLASE in the last 8760 hours. No results for input(s): AMMONIA in the last 8760 hours. CBC: Recent Labs    09/30/20 1609  WBC 8.0  NEUTROABS 5,216  HGB 12.8  HCT 41.4  MCV 82.5  PLT 257   Lipid Panel: No results for input(s): CHOL, HDL, LDLCALC, TRIG, CHOLHDL, LDLDIRECT in the last 8760 hours. TSH: No results for input(s): TSH in the last 8760 hours. A1C: No results found for: HGBA1C   Assessment/Plan 1. Paroxysmal atrial fibrillation (HCC) Rate controlled on metoprolol. Not on anticoagulation due to high fall risk.  - CBC with Differential/Platelet  2. Essential hypertension --stable. Goal bp <140/90. Continue on current regimen with low sodium diet.  - CBC with  Differential/Platelet - CMP with eGFR([facility])  4. Osteoarthritis, unspecified osteoarthritis type, unspecified site Stable, continues with PT and will have OT evaluation. Has tramadol when pain is severe but rarly uses.   5. Depression with anxiety Controlled on current regimen. Has PRN xanax but does not use often.  6. Noninfected skin tear of leg, right, subsequent encounter Healing well. Continues with home health nursing to follow and dressing changes.   7. Hallucination, visual -ongoing, not causing any distress or anxiety at this time. Likely due to progressive dementia.  - CBC with Differential/Platelet - CMP with eGFR([facility])  8. Bilateral lower extremity edema -stable, encouraged to elevate legs above level of heart as tolerates, low sodium diet, compression hose as tolerates (on in am, off in pm)   9. Severe dementia with anxiety, unspecified dementia type -stable, progressive decline of memory. No acute changes in functional or cognitive status   Next appt:  Carlos American. [place], [facility] [phone]

## 2021-03-04 ENCOUNTER — Telehealth: Payer: Self-pay | Admitting: *Deleted

## 2021-03-04 NOTE — Telephone Encounter (Signed)
Leonette Most with [facility] called requesting verbal orders to extend [place].   Verbal orders given.

## 2021-03-29 ENCOUNTER — Other Ambulatory Visit: Payer: Self-pay

## 2021-03-29 ENCOUNTER — Encounter: Payer: Self-pay | Admitting: Family

## 2021-03-29 ENCOUNTER — Encounter: Payer: Self-pay | Admitting: Nurse Practitioner

## 2021-03-29 ENCOUNTER — Ambulatory Visit: Payer: Medicare Other | Admitting: Family

## 2021-03-29 VITALS — BP 140/70 | HR 57 | Temp 96.9°F | Ht 63.0 in | Wt 144.8 lb

## 2021-03-29 DIAGNOSIS — R600 Localized edema: Secondary | ICD-10-CM

## 2021-03-29 DIAGNOSIS — R0602 Shortness of breath: Secondary | ICD-10-CM | POA: Diagnosis not present

## 2021-03-29 MED ORDER — POTASSIUM CHLORIDE CRYS ER 20 MEQ PO TBCR: EXTENDED_RELEASE_TABLET | ORAL | 0 refills | Status: DC

## 2021-03-29 MED ORDER — TORSEMIDE 20 MG PO TABS: ORAL_TABLET | ORAL | 3 refills | Status: DC

## 2021-03-29 MED ORDER — TRAMADOL HCL 50 MG PO TABS: 50.0000 mg | ORAL_TABLET | Freq: Four times a day (QID) | ORAL | 0 refills | Status: DC | PRN

## 2021-03-29 NOTE — Progress Notes (Signed)
Provider: Rajveer Handler FNP-C  [name], NP  Patient Care Team: [name], NP as PCP - General (Geriatric Medicine) Jolene Schimke, MD as Referring Physician (Dermatology) Birder Robson, MD as Referring Physician (Ophthalmology) Jaymes Graff, DO as Consulting Physician (Orthopedic Surgery)  Extended Emergency Contact Information Primary Emergency Contact: Beryle Flock Mobile Phone: [phone] Relation: Other Secondary Emergency Contact: Corwin Levins Mobile Phone: [phone] Relation: Niece  Code Status:  DNR Goals of care: Advanced Directive information Advanced Directives 12/10/2020  Does Patient Have a Medical Advance Directive? Yes  Type of Advance Directive Out of facility DNR (pink MOST or yellow form);Healthcare Power of Attorney  Does patient want to make changes to medical advance directive? -  Copy of [place] in Chart? No - copy requested  Pre-existing out of facility DNR order (yellow form or pink MOST form) -     Chief Complaint  Patient presents with   Acute Visit    Complains of shortness of breath and swelling. Patient has gained 10 lbs within last month. Having shortness of breath for about a week. Patient getting short of breath when walking. Patient has leg swelling. Treating swelling with elevation and TED hose    HPI:  Pt is a 88 y.o. female seen today for an acute visit for evaluation of shortness of breathing and swelling on the legs x 1 week .Has gained 10 lbs.shortness of worst  with exertion.Has been wearing compression and keeping legs elevated.wears compression every now and then    Past Medical History:  Diagnosis Date   A-fib ([place])    Anemia    Anxiety    Dry skin    on ears   Gastroesophageal reflux    Hearing loss    significant  uses hearing aids   High blood pressure    Hyperlipidemia    Loss of appetite    Loss of smell    Macular degeneration    Malnutrition (HCC)     Osteoporosis    Persistent dry cough    Skin cancer    basal / squamous   Slow transit constipation    abstracted from new patient packet    Vitamin D deficiency 01/26/2018   Past Surgical History:  Procedure Laterality Date   FEMUR IM NAIL Right 12/28/2018   Procedure: INTRAMEDULLARY (IM) NAIL FEMORAL;  Surgeon: Dorna Leitz, MD;  Location: [facility];  Service: Orthopedics;  Laterality: Right;    No Known Allergies  Outpatient Encounter Medications as of 03/29/2021  Medication Sig   acetaminophen (TYLENOL) 325 MG tablet Take 650 mg by mouth daily. Mid-day   ALPRAZolam (XANAX) 0.25 MG tablet Take 0.25 mg by mouth every 6 (six) hours as needed for anxiety.   amoxicillin (AMOXIL) 500 MG capsule Take 2,000 mg by mouth as directed. 1 hour prior to a dental appointment   busPIRone (BUSPAR) 10 MG tablet Take 1 tablet (10 mg total) by mouth 3 (three) times daily.   Chloroxylenol-Zinc Oxide (BAZA EX) Apply 1 application topically as needed.   docusate sodium (COLACE) 100 MG capsule Take 100 mg by mouth 2 (two) times daily as needed for mild constipation.   Elastic Bandages & Supports (JOBST ACTIVE 20-30MMHG MEDIUM) MISC by Does not apply route. Provide every morning and remove every evening   Emollient (EUCERIN) lotion Apply topically as needed for dry skin. Spread topically to both legs once daily   famotidine (PEPCID) 20 MG tablet Take 1 tablet (20  mg total) by mouth at bedtime.   memantine (NAMENDA XR) 28 MG CP24 24 hr capsule Take 1 capsule (28 mg total) by mouth daily.   metoprolol succinate (TOPROL-XL) 25 MG 24 hr tablet Take 1 tablet (25 mg total) by mouth daily.   Multiple Vitamins-Minerals (EQ VISION FORMULA 50+ PO) Take 1 tablet by mouth every 12 (twelve) hours.   polyethylene glycol (MIRALAX / GLYCOLAX) 17 g packet Take 17 g by mouth daily. Mix 17gm in liquid and drink by mouth once daily as needed for constipation   potassium chloride (KLOR-CON) 20 MEQ tablet Take 1 tablet (20 mEq  total) by mouth daily.   QUEtiapine (SEROQUEL) 25 MG tablet Take 25 mg by mouth at bedtime.   saccharomyces boulardii (FLORASTOR) 250 MG capsule Take 250 mg by mouth 2 (two) times daily.   sertraline (ZOLOFT) 50 MG tablet Take 75 mg by mouth daily.   torsemide (DEMADEX) 20 MG tablet Take 20 mg by mouth daily.   traMADol (ULTRAM) 50 MG tablet Take 50 mg by mouth every 6 (six) hours as needed (while awake as needed).   No facility-administered encounter medications on file as of 03/29/2021.    Review of Systems  Constitutional:  Negative for appetite change, chills, fatigue, fever and unexpected weight change.  HENT:  Negative for congestion, dental problem, ear discharge, ear pain, facial swelling, hearing loss, nosebleeds, postnasal drip, rhinorrhea, sinus pressure, sinus pain, sneezing, sore throat, tinnitus and trouble swallowing.   Eyes:  Negative for pain, discharge, redness, itching and visual disturbance.  Respiratory:  Negative for cough, chest tightness and wheezing.        Shortness of breath with exertion   Cardiovascular:  Positive for leg swelling. Negative for chest pain and palpitations.  Gastrointestinal:  Negative for abdominal distention, abdominal pain, blood in stool, constipation, diarrhea, nausea and vomiting.  Endocrine: Negative for cold intolerance, heat intolerance, polydipsia, polyphagia and polyuria.  Genitourinary:  Negative for difficulty urinating, dysuria, flank pain, frequency and urgency.  Musculoskeletal:  Negative for arthralgias, back pain, gait problem, joint swelling, myalgias, neck pain and neck stiffness.  Skin:  Negative for color change, pallor, rash and wound.  Neurological:  Negative for dizziness, syncope, speech difficulty, weakness, light-headedness, numbness and headaches.  Hematological:  Does not bruise/bleed easily.  Psychiatric/Behavioral:  Negative for agitation, behavioral problems, confusion, hallucinations, self-injury, sleep disturbance  and suicidal ideas. The patient is not nervous/anxious.    Immunization History  Administered Date(s) Administered   Fluad Quad(high Dose 65+) 12/14/2018, 11/12/2020   Influenza, High Dose Seasonal PF 01/02/2017, 02/21/2018   Influenza-Unspecified 12/17/2014   Moderna SARS-COV2 Booster Vaccination 01/02/2020   Moderna Sars-Covid-2 Vaccination 03/04/2019, 04/01/2019   Pneumococcal Conjugate-13 08/10/2016   Pneumococcal Polysaccharide-23 10/12/2010   Tdap 10/23/2006, 08/29/2018   Zoster Recombinat (Shingrix) 12/10/2020   Pertinent  Health Maintenance Due  Topic Date Due   DEXA SCAN  Never done   INFLUENZA VACCINE  Completed   Fall Risk 09/16/2020 09/30/2020 12/10/2020 02/01/2021 03/29/2021  Falls in the past year? 0 0 0 1 1  Was there an injury with Fall? 0 0 0 0 0  Fall Risk Category Calculator 0 0 0 2 2  Fall Risk Category Low Low Low Moderate Moderate  Patient Fall Risk Level Low fall risk Low fall risk Low fall risk Low fall risk Moderate fall risk  Patient at Risk for Falls Due to No Fall Risks No Fall Risks No Fall Risks History of fall(s) History of fall(s)  Fall  risk Follow up Falls evaluation completed Falls evaluation completed Falls evaluation completed Falls evaluation completed;Education provided;Falls prevention discussed Falls evaluation completed   Functional Status Survey:    Vitals:   03/29/21 1459  BP: 140/70  Pulse: (!) 57  Temp: (!) 96.9 F (36.1 C)  SpO2: 95%  Weight: 144 lb 12.8 oz (65.7 kg)  Height: 5\' 3"  (1.6 m)   Body mass index is 25.65 kg/m. Physical Exam Vitals reviewed.  Constitutional:      General: She is not in acute distress.    Appearance: Normal appearance. She is normal weight. She is not ill-appearing or diaphoretic.  HENT:     Head: Normocephalic.     Right Ear: Tympanic membrane, ear canal and external ear normal. There is no impacted cerumen.     Left Ear: Tympanic membrane, ear canal and external ear normal. There is no impacted  cerumen.     Nose: Nose normal. No congestion or rhinorrhea.     Mouth/Throat:     Mouth: Mucous membranes are moist.     Pharynx: Oropharynx is clear. No oropharyngeal exudate or posterior oropharyngeal erythema.  Eyes:     General: No scleral icterus.       Right eye: No discharge.        Left eye: No discharge.     Extraocular Movements: Extraocular movements intact.     Conjunctiva/sclera: Conjunctivae normal.     Pupils: Pupils are equal, round, and reactive to light.  Neck:     Vascular: No carotid bruit.  Cardiovascular:     Rate and Rhythm: Normal rate and regular rhythm.     Pulses: Normal pulses.     Heart sounds: Normal heart sounds. No murmur heard.   No friction rub. No gallop.  Pulmonary:     Effort: Pulmonary effort is normal. No respiratory distress.     Breath sounds: Normal breath sounds. No wheezing, rhonchi or rales.  Chest:     Chest wall: No tenderness.  Abdominal:     General: Bowel sounds are normal. There is no distension.     Palpations: Abdomen is soft. There is no mass.     Tenderness: There is no abdominal tenderness. There is no right CVA tenderness, left CVA tenderness, guarding or rebound.  Musculoskeletal:        General: No swelling or tenderness. Normal range of motion.     Cervical back: Normal range of motion. No rigidity or tenderness.     Right lower leg: Edema present.     Left lower leg: Edema present.  Lymphadenopathy:     Cervical: No cervical adenopathy.  Skin:    General: Skin is warm and dry.     Coloration: Skin is not pale.     Findings: No bruising, erythema, lesion or rash.  Neurological:     Mental Status: She is alert and oriented to person, place, and time.     Cranial Nerves: No cranial nerve deficit.     Sensory: No sensory deficit.     Motor: No weakness.     Coordination: Coordination normal.     Gait: Gait normal.  Psychiatric:        Mood and Affect: Mood normal.        Speech: Speech normal.        Behavior:  Behavior normal.    Labs reviewed: Recent Labs    09/30/20 1609 02/01/21 1344  NA 139 141  K 4.2 4.2  CL 101 102  CO2 27 30  GLUCOSE 109 84  BUN 18 24  CREATININE 1.15* 0.83  CALCIUM 9.5 9.2   Recent Labs    09/30/20 1609 02/01/21 1344  AST 17 16  ALT 9 9  BILITOT 0.6 0.6  PROT 6.6 6.5   Recent Labs    09/30/20 1609 02/01/21 1344  WBC 8.0 7.0  NEUTROABS 5,216 4,354  HGB 12.8 13.0  HCT 41.4 40.6  MCV 82.5 81.2  PLT 257 220   Lab Results  Component Value Date   TSH 1.49 11/28/2018   No results found for: HGBA1C Lab Results  Component Value Date   CHOL 116 09/06/2018   HDL 55 09/06/2018   LDLCALC 43 09/06/2018   TRIG 101 09/06/2018   CHOLHDL 2.1 09/06/2018    Significant Diagnostic Results in last 30 days:  No results found.  Assessment/Plan  1. Bilateral lower extremity edema Worsening edema.will increase Torsemide as below  - torsemide (DEMADEX) 20 MG tablet; Take 2 tablet ( 40 mg tablet ) by mouth daily x 5 days then resume one ( 20)  tablet by mouth daily  Dispense: 30 tablet; Refill: 3 - potassium chloride SA (KLOR-CON M) 20 MEQ tablet; Take 40 meq tablet by mouth daily x 5 days then resume 20 meq tablet daily.  Dispense: 30 tablet; Refill: 0  2. Shortness of breath on exertion Weight gain and worsening leg edema.lungs clear. - torsemide (DEMADEX) 20 MG tablet; Take 2 tablet ( 40 mg tablet ) by mouth daily x 5 days then resume one ( 20)  tablet by mouth daily  Dispense: 30 tablet; Refill: 3 - potassium chloride SA (KLOR-CON M) 20 MEQ tablet; Take 40 meq tablet by mouth daily x 5 days then resume 20 meq tablet daily.  Dispense: 30 tablet; Refill: 0 - check weight weekly and notify provider for any abrupt weight gain > 3 lbs in a week.   Family/ staff Communication: Reviewed plan of care with patient and daughter verbalized understanding.  Labs/tests ordered: None   Next Appointment: As needed if symptoms worsen or fail to improve    Sandrea Hughs, NP

## 2021-03-29 NOTE — Patient Instructions (Signed)
-   Take Torsemide 20 mg tablet take 2 tablets ( 40 mg ) by mouth daily x 5 days then resume 20 mg tablet daily   - Take Potassium chloride 40 meq Tablet take one  tablet  by mouth daily x 5 days then resume 20 meq tablet daily   - check weight weekly and notify provider for any abrupt weight gain > 3 lbs in a week.

## 2021-03-29 NOTE — Telephone Encounter (Signed)
Nurse @ [facility] called requesting a refill for patients Tramadol 50 mcg to [facility]. She report that a fax for medication refill was send on 03/25/2021 and she was just following up on the request.

## 2021-03-31 ENCOUNTER — Ambulatory Visit
Admission: RE | Admit: 2021-03-31 | Discharge: 2021-03-31 | Disposition: A | Discharge status: Home / Self Care | Payer: Medicare Other | Source: Ambulatory Visit | Attending: Nurse Practitioner | Admitting: Nurse Practitioner

## 2021-03-31 DIAGNOSIS — E2839 Other primary ovarian failure: Secondary | ICD-10-CM

## 2021-04-07 ENCOUNTER — Telehealth: Payer: Self-pay | Admitting: *Deleted

## 2021-04-07 DIAGNOSIS — S41112D Laceration without foreign body of left upper arm, subsequent encounter: Secondary | ICD-10-CM | POA: Diagnosis not present

## 2021-04-07 DIAGNOSIS — K219 Gastro-esophageal reflux disease without esophagitis: Secondary | ICD-10-CM

## 2021-04-07 DIAGNOSIS — H3530 Unspecified macular degeneration: Secondary | ICD-10-CM

## 2021-04-07 DIAGNOSIS — D631 Anemia in chronic kidney disease: Secondary | ICD-10-CM | POA: Diagnosis not present

## 2021-04-07 DIAGNOSIS — E785 Hyperlipidemia, unspecified: Secondary | ICD-10-CM

## 2021-04-07 DIAGNOSIS — N189 Chronic kidney disease, unspecified: Secondary | ICD-10-CM | POA: Diagnosis not present

## 2021-04-07 DIAGNOSIS — I4891 Unspecified atrial fibrillation: Secondary | ICD-10-CM

## 2021-04-07 DIAGNOSIS — I129 Hypertensive chronic kidney disease with stage 1 through stage 4 chronic kidney disease, or unspecified chronic kidney disease: Secondary | ICD-10-CM | POA: Diagnosis not present

## 2021-04-07 NOTE — Telephone Encounter (Signed)
Yes okay to approve

## 2021-04-07 NOTE — Telephone Encounter (Signed)
Jessica Notified and agreed.

## 2021-04-07 NOTE — Telephone Encounter (Signed)
Jasmine Jones with [facility] called requesting Verbal orders for Skilled Nursing for 2x6weeks for Skin Tear on Left upper arm.   Will you sign off on this.  Please Advise.

## 2021-04-09 ENCOUNTER — Encounter (HOSPITAL_COMMUNITY): Payer: Self-pay

## 2021-04-09 ENCOUNTER — Inpatient Hospital Stay (HOSPITAL_COMMUNITY)
Admission: EM | Admit: 2021-04-09 | Discharge: 2021-04-20 | DRG: 193 | Disposition: A | Discharge status: Skilled Nursing Facility | Payer: Medicare Other | Source: Skilled Nursing Facility | Attending: Internal Medicine | Admitting: Internal Medicine

## 2021-04-09 ENCOUNTER — Other Ambulatory Visit: Payer: Self-pay

## 2021-04-09 ENCOUNTER — Emergency Department (HOSPITAL_COMMUNITY): Payer: Medicare Other

## 2021-04-09 DIAGNOSIS — K219 Gastro-esophageal reflux disease without esophagitis: Secondary | ICD-10-CM | POA: Diagnosis present

## 2021-04-09 DIAGNOSIS — I248 Other forms of acute ischemic heart disease: Secondary | ICD-10-CM | POA: Diagnosis present

## 2021-04-09 DIAGNOSIS — H9190 Unspecified hearing loss, unspecified ear: Secondary | ICD-10-CM | POA: Diagnosis present

## 2021-04-09 DIAGNOSIS — E559 Vitamin D deficiency, unspecified: Secondary | ICD-10-CM | POA: Diagnosis present

## 2021-04-09 DIAGNOSIS — R509 Fever, unspecified: Secondary | ICD-10-CM

## 2021-04-09 DIAGNOSIS — G9341 Metabolic encephalopathy: Secondary | ICD-10-CM | POA: Diagnosis present

## 2021-04-09 DIAGNOSIS — I4891 Unspecified atrial fibrillation: Secondary | ICD-10-CM | POA: Diagnosis present

## 2021-04-09 DIAGNOSIS — Y92121 Bathroom in nursing home as the place of occurrence of the external cause: Secondary | ICD-10-CM

## 2021-04-09 DIAGNOSIS — I10 Essential (primary) hypertension: Secondary | ICD-10-CM | POA: Diagnosis present

## 2021-04-09 DIAGNOSIS — R54 Age-related physical debility: Secondary | ICD-10-CM | POA: Diagnosis present

## 2021-04-09 DIAGNOSIS — Z20822 Contact with and (suspected) exposure to covid-19: Secondary | ICD-10-CM | POA: Diagnosis present

## 2021-04-09 DIAGNOSIS — E872 Acidosis, unspecified: Secondary | ICD-10-CM | POA: Diagnosis present

## 2021-04-09 DIAGNOSIS — E785 Hyperlipidemia, unspecified: Secondary | ICD-10-CM | POA: Diagnosis present

## 2021-04-09 DIAGNOSIS — R600 Localized edema: Secondary | ICD-10-CM | POA: Diagnosis present

## 2021-04-09 DIAGNOSIS — Z85828 Personal history of other malignant neoplasm of skin: Secondary | ICD-10-CM

## 2021-04-09 DIAGNOSIS — A419 Sepsis, unspecified organism: Secondary | ICD-10-CM

## 2021-04-09 DIAGNOSIS — Z515 Encounter for palliative care: Secondary | ICD-10-CM

## 2021-04-09 DIAGNOSIS — R778 Other specified abnormalities of plasma proteins: Secondary | ICD-10-CM | POA: Diagnosis present

## 2021-04-09 DIAGNOSIS — I480 Paroxysmal atrial fibrillation: Secondary | ICD-10-CM | POA: Diagnosis present

## 2021-04-09 DIAGNOSIS — J189 Pneumonia, unspecified organism: Principal | ICD-10-CM | POA: Diagnosis present

## 2021-04-09 DIAGNOSIS — F0390 Unspecified dementia without behavioral disturbance: Secondary | ICD-10-CM | POA: Diagnosis present

## 2021-04-09 DIAGNOSIS — Z833 Family history of diabetes mellitus: Secondary | ICD-10-CM

## 2021-04-09 DIAGNOSIS — Z66 Do not resuscitate: Secondary | ICD-10-CM | POA: Diagnosis present

## 2021-04-09 DIAGNOSIS — W1830XA Fall on same level, unspecified, initial encounter: Secondary | ICD-10-CM | POA: Diagnosis present

## 2021-04-09 DIAGNOSIS — R262 Difficulty in walking, not elsewhere classified: Secondary | ICD-10-CM | POA: Diagnosis present

## 2021-04-09 DIAGNOSIS — I4510 Unspecified right bundle-branch block: Secondary | ICD-10-CM | POA: Diagnosis present

## 2021-04-09 DIAGNOSIS — E876 Hypokalemia: Secondary | ICD-10-CM

## 2021-04-09 DIAGNOSIS — Z79899 Other long term (current) drug therapy: Secondary | ICD-10-CM

## 2021-04-09 DIAGNOSIS — R1310 Dysphagia, unspecified: Secondary | ICD-10-CM | POA: Diagnosis present

## 2021-04-09 DIAGNOSIS — F419 Anxiety disorder, unspecified: Secondary | ICD-10-CM | POA: Diagnosis present

## 2021-04-09 DIAGNOSIS — M25551 Pain in right hip: Secondary | ICD-10-CM | POA: Diagnosis present

## 2021-04-09 DIAGNOSIS — R531 Weakness: Secondary | ICD-10-CM

## 2021-04-09 LAB — LACTIC ACID, PLASMA
Lactic Acid, Venous: 2.9 mmol/L (ref 0.5–1.9)
Lactic Acid, Venous: 2.1 mmol/L (ref 0.5–1.9)
Lactic Acid, Venous: 1.2 mmol/L (ref 0.5–1.9)

## 2021-04-09 LAB — URINALYSIS, ROUTINE W REFLEX MICROSCOPIC
Bilirubin Urine: NEGATIVE
Glucose, UA: NEGATIVE mg/dL
Hgb urine dipstick: NEGATIVE
Ketones, ur: NEGATIVE mg/dL
Nitrite: NEGATIVE
Protein, ur: NEGATIVE mg/dL
Specific Gravity, Urine: 1.015 (ref 1.005–1.030)
pH: 6 (ref 5.0–8.0)

## 2021-04-09 LAB — COMPREHENSIVE METABOLIC PANEL
ALT: 16 U/L (ref 0–44)
AST: 26 U/L (ref 15–41)
Albumin: 3.7 g/dL (ref 3.5–5.0)
Alkaline Phosphatase: 97 U/L (ref 38–126)
Anion gap: 12 (ref 5–15)
BUN: 18 mg/dL (ref 8–23)
CO2: 24 mmol/L (ref 22–32)
Calcium: 9 mg/dL (ref 8.9–10.3)
Chloride: 104 mmol/L (ref 98–111)
Creatinine, Ser: 0.79 mg/dL (ref 0.44–1.00)
GFR, Estimated: >60 mL/min (ref >60)
Glucose, Bld: 139 mg/dL — ABNORMAL HIGH (ref 70–99)
Potassium: 3.4 mmol/L — ABNORMAL LOW (ref 3.5–5.1)
Sodium: 140 mmol/L (ref 135–145)
Total Bilirubin: 0.9 mg/dL (ref 0.3–1.2)
Total Protein: 6.4 g/dL — ABNORMAL LOW (ref 6.5–8.1)

## 2021-04-09 LAB — CBC WITH DIFFERENTIAL/PLATELET
Abs Immature Granulocytes: 0.05 10*3/uL (ref 0.00–0.07)
Basophils Absolute: 0 10*3/uL (ref 0.0–0.1)
Basophils Relative: 0 %
Eosinophils Absolute: 0 10*3/uL (ref 0.0–0.5)
Eosinophils Relative: 0 %
HCT: 41 % (ref 36.0–46.0)
Hemoglobin: 12.9 g/dL (ref 12.0–15.0)
Immature Granulocytes: 1 %
Lymphocytes Relative: 15 %
Lymphs Abs: 1.4 10*3/uL (ref 0.7–4.0)
MCH: 25.6 pg — ABNORMAL LOW (ref 26.0–34.0)
MCHC: 31.5 g/dL (ref 30.0–36.0)
MCV: 81.5 fL (ref 80.0–100.0)
Monocytes Absolute: 0.7 10*3/uL (ref 0.1–1.0)
Monocytes Relative: 7 %
Neutro Abs: 7.1 10*3/uL (ref 1.7–7.7)
Neutrophils Relative %: 77 %
Platelets: 191 10*3/uL (ref 150–400)
RBC: 5.03 MIL/uL (ref 3.87–5.11)
RDW: 14.6 % (ref 11.5–15.5)
WBC: 9.2 10*3/uL (ref 4.0–10.5)
nRBC: 0 % (ref 0.0–0.2)

## 2021-04-09 LAB — TROPONIN I (HIGH SENSITIVITY)
Troponin I (High Sensitivity): 27 ng/L — ABNORMAL HIGH (ref <18)
Troponin I (High Sensitivity): 30 ng/L — ABNORMAL HIGH (ref <18)

## 2021-04-09 LAB — RESP PANEL BY RT-PCR (FLU A&B, COVID) ARPGX2
Influenza A by PCR: NEGATIVE
Influenza B by PCR: NEGATIVE
SARS Coronavirus 2 by RT PCR: NEGATIVE

## 2021-04-09 LAB — PROTIME-INR
INR: 1 (ref 0.8–1.2)
Prothrombin Time: 13.4 seconds (ref 11.4–15.2)

## 2021-04-09 LAB — BRAIN NATRIURETIC PEPTIDE: B Natriuretic Peptide: 140.7 pg/mL — ABNORMAL HIGH (ref 0.0–100.0)

## 2021-04-09 LAB — APTT: aPTT: 26 seconds (ref 24–36)

## 2021-04-09 LAB — MAGNESIUM: Magnesium: 2 mg/dL (ref 1.7–2.4)

## 2021-04-09 LAB — LIPASE, BLOOD: Lipase: 32 U/L (ref 11–51)

## 2021-04-09 LAB — PROCALCITONIN: Procalcitonin: <0.1 ng/mL

## 2021-04-09 MED ORDER — ONDANSETRON HCL 4 MG/2ML IJ SOLN: 4.0000 mg | Freq: Four times a day (QID) | INTRAMUSCULAR | Status: DC | PRN

## 2021-04-09 MED ORDER — ACETAMINOPHEN 325 MG PO TABS
650.0000 mg | ORAL_TABLET | Freq: Four times a day (QID) | ORAL | Status: DC | PRN
  Administered 2021-04-11 – 2021-04-18 (×9): 650 mg via ORAL
  Filled 2021-04-09 (×9): qty 2

## 2021-04-09 MED ORDER — LACTATED RINGERS IV SOLN: INTRAVENOUS | Status: AC

## 2021-04-09 MED ORDER — METRONIDAZOLE 500 MG/100ML IV SOLN
500.0000 mg | Freq: Once | INTRAVENOUS | Status: AC
  Administered 2021-04-09: 500 mg via INTRAVENOUS
  Filled 2021-04-09: qty 100

## 2021-04-09 MED ORDER — HYDROCODONE-ACETAMINOPHEN 5-325 MG PO TABS
1.0000 | ORAL_TABLET | ORAL | Status: DC | PRN
  Administered 2021-04-18: 2 via ORAL
  Administered 2021-04-20: 1 via ORAL
  Filled 2021-04-09: qty 1
  Filled 2021-04-09: qty 2
  Filled 2021-04-09: qty 1

## 2021-04-09 MED ORDER — LACTATED RINGERS IV BOLUS
1000.0000 mL | Freq: Once | INTRAVENOUS | Status: AC
  Administered 2021-04-09: 1000 mL via INTRAVENOUS

## 2021-04-09 MED ORDER — LACTATED RINGERS IV BOLUS (SEPSIS)
1000.0000 mL | Freq: Once | INTRAVENOUS | Status: AC
  Administered 2021-04-09: 1000 mL via INTRAVENOUS

## 2021-04-09 MED ORDER — SODIUM CHLORIDE 0.9 % IV SOLN
2.0000 g | Freq: Once | INTRAVENOUS | Status: AC
  Administered 2021-04-09: 2 g via INTRAVENOUS
  Filled 2021-04-09: qty 2

## 2021-04-09 MED ORDER — LACTATED RINGERS IV SOLN: INTRAVENOUS | Status: DC

## 2021-04-09 MED ORDER — SODIUM CHLORIDE 0.9 % IV SOLN
2.0000 g | Freq: Two times a day (BID) | INTRAVENOUS | Status: AC
  Administered 2021-04-10 – 2021-04-14 (×9): 2 g via INTRAVENOUS
  Filled 2021-04-09 (×9): qty 2

## 2021-04-09 MED ORDER — ACETAMINOPHEN 650 MG RE SUPP: 650.0000 mg | Freq: Four times a day (QID) | RECTAL | Status: DC | PRN

## 2021-04-09 MED ORDER — POTASSIUM CHLORIDE CRYS ER 20 MEQ PO TBCR
40.0000 meq | EXTENDED_RELEASE_TABLET | Freq: Every day | ORAL | Status: DC
  Administered 2021-04-09 – 2021-04-17 (×8): 40 meq via ORAL
  Filled 2021-04-09 (×8): qty 2

## 2021-04-09 MED ORDER — VANCOMYCIN HCL IN DEXTROSE 1-5 GM/200ML-% IV SOLN
1000.0000 mg | Freq: Once | INTRAVENOUS | Status: AC
  Administered 2021-04-09: 1000 mg via INTRAVENOUS
  Filled 2021-04-09: qty 200

## 2021-04-09 MED ORDER — LORAZEPAM 2 MG/ML IJ SOLN
0.5000 mg | Freq: Four times a day (QID) | INTRAMUSCULAR | Status: DC | PRN
  Administered 2021-04-09 – 2021-04-20 (×17): 0.5 mg via INTRAVENOUS
  Filled 2021-04-09 (×17): qty 1

## 2021-04-09 MED ORDER — METRONIDAZOLE 500 MG/100ML IV SOLN
500.0000 mg | Freq: Two times a day (BID) | INTRAVENOUS | Status: DC
  Administered 2021-04-10 – 2021-04-13 (×7): 500 mg via INTRAVENOUS
  Filled 2021-04-09 (×7): qty 100

## 2021-04-09 MED ORDER — ONDANSETRON HCL 4 MG PO TABS: 4.0000 mg | ORAL_TABLET | Freq: Four times a day (QID) | ORAL | Status: DC | PRN

## 2021-04-09 NOTE — H&P (Signed)
History and Physical    Patient: Jasmine Jones WFU:[id number] DOB: [birth year] DOA: 04/09/2021 DOS: the patient was seen and examined on 04/09/2021 PCP: [name], NP  Patient coming from: ALF/ILF  Chief Complaint:  Chief Complaint  Patient presents with   Weakness   Fever    HPI: Jasmine Jones is a 88 y.o. female with medical history significant of anxiety, dementia, GERD, afib, HTN. Presenting with febrile illness and weakness. History is difficult to obtain as the patient has dementia and the caregiver is unclear of what happened. The caregiver reports that the patient was found to be confused this morning. She seemed slower than normal at getting about her day. She was having difficulty walking. For some reason, they decided to check her temperature. It was 102. They gave her APAP and sent her to the ED for evaluation.   Of note, the patient states that she fell in the bathroom last night and complains of right hip pain. She is unable to tell the mechanism of the fall. The caregiver at bedside reports that she fell last week and that she is often confused about timelines of certain events.    Review of Systems: unable to review all systems due to the inability of the patient to answer questions. Past Medical History:  Diagnosis Date   A-fib ([place])    Anemia    Anxiety    Dry skin    on ears   Gastroesophageal reflux    Hearing loss    significant  uses hearing aids   High blood pressure    Hyperlipidemia    Loss of appetite    Loss of smell    Macular degeneration    Malnutrition (H[facility])    Osteoporosis    Persistent dry cough    Skin cancer    basal / squamous   Slow transit constipation    abstracted from new patient packet    Vitamin D deficiency 01/26/2018   Past Surgical History:  Procedure Laterality Date   FEMUR IM NAIL Right 12/28/2018   Procedure: INTRAMEDULLARY (IM) NAIL FEMORAL;  Surgeon: Dorna Leitz, MD;  Location: [facility];  Service:  Orthopedics;  Laterality: Right;   Social History:  reports that she has never smoked. She has never used smokeless tobacco. She reports that she does not drink alcohol and does not use drugs.  No Known Allergies  Family History  Problem Relation Age of Onset   Atrial fibrillation Sister    Diabetes Sister    Osteoarthritis Sister    Osteopenia Sister    Mental retardation Sister     Prior to Admission medications   Medication Sig Start Date End Date Taking? Authorizing Provider  acetaminophen (TYLENOL) 325 MG tablet Take 650 mg by mouth daily. Mid-day    [provider]  ALPRAZolam (XANAX) 0.25 MG tablet Take 0.25 mg by mouth every 6 (six) hours as needed for anxiety.    [provider]  amoxicillin (AMOXIL) 500 MG capsule Take 2,000 mg by mouth as directed. 1 hour prior to a dental appointment    [provider]  busPIRone (BUSPAR) 10 MG tablet Take 1 tablet (10 mg total) by mouth 3 (three) times daily. 06/14/19   [name], NP  Chloroxylenol-Zinc Oxide (BAZA EX) Apply 1 application topically as needed.    [provider]  docusate sodium (COLACE) 100 MG capsule Take 100 mg by mouth 2 (two) times daily as needed for mild constipation.  [provider]  Elastic Bandages & Supports (JOBST ACTIVE 20-30MMHG MEDIUM) MISC by Does not apply route. Provide every morning and remove every evening    [provider]  Emollient (EUCERIN) lotion Apply topically as needed for dry skin. Spread topically to both legs once daily    [provider]  famotidine (PEPCID) 20 MG tablet Take 1 tablet (20 mg total) by mouth at bedtime. 05/08/19   [name], NP  memantine (NAMENDA XR) 28 MG CP24 24 hr capsule Take 1 capsule (28 mg total) by mouth daily. 01/02/19   [place] Donnamarie Poag, DO  metoprolol succinate (TOPROL-XL) 25 MG 24 hr tablet Take 1 tablet (25 mg total) by mouth daily. 01/02/19 03/28/48  [place] Donnamarie Poag, DO  Multiple  Vitamins-Minerals (EQ VISION FORMULA 50+ PO) Take 1 tablet by mouth every 12 (twelve) hours.    [provider]  polyethylene glycol (MIRALAX / GLYCOLAX) 17 g packet Take 17 g by mouth daily. Mix 17gm in liquid and drink by mouth once daily as needed for constipation    [provider]  potassium chloride SA (KLOR-CON M) 20 MEQ tablet Take 40 meq tablet by mouth daily x 5 days then resume 20 meq tablet daily. 03/29/21   Ngetich, [name] C, NP  QUEtiapine (SEROQUEL) 25 MG tablet Take 25 mg by mouth at bedtime.    [provider]  saccharomyces boulardii (FLORASTOR) 250 MG capsule Take 250 mg by mouth 2 (two) times daily.    [provider]  sertraline (ZOLOFT) 50 MG tablet Take 75 mg by mouth daily.    [provider]  torsemide (DEMADEX) 20 MG tablet Take 2 tablet ( 40 mg tablet ) by mouth daily x 5 days then resume one ( 20)  tablet by mouth daily 03/29/21   Ngetich, [name] C, NP  traMADol (ULTRAM) 50 MG tablet Take 1 tablet (50 mg total) by mouth every 6 (six) hours as needed (while awake as needed). 03/29/21   [name], NP    Physical Exam: Vitals:   04/09/21 1315 04/09/21 1330 04/09/21 1345 04/09/21 1400  BP: 131/75 132/69 (!) 141/80 (!) 144/79  Pulse: 70 71 72 72  Resp: 16 14 16 13   Temp:      TempSrc:      SpO2: 94% 94% 94% 97%   General: 88 y.o. female resting in bed in NAD Eyes: PERRL, normal sclera ENMT: Nares patent w/o discharge, orophaynx clear, dentition normal, ears w/o discharge/lesions/ulcers Neck: Supple, trachea midline Cardiovascular: RRR, +S1, S2, no m/g/r, equal pulses throughout Respiratory: CTABL, no w/r/r, normal WOB GI: BS+, NDNT, no masses noted, no organomegaly noted MSK: No c/c; BLE edema and venous stasis Neuro: A&O x name only, other than hard of hearing - no focal deficits Psyc: Appropriate interaction and affect, calm/cooperative  Data Reviewed:  Lactic acid: 2.9 K+ 3.4 Trp 27 -> 30 UA negative EKG:  sinus, RBBB CXR negative  Assessment and Plan: No notes have been filed under this hospital service. Service: Hospitalist  Fever of Unknown origin Lactic acidosis     - place in obs     - UA, CXR negative     - no white count     - no fevers recorded here, but reports of a temp of 102 at her facility     - no complaint of diarrhea or urinary symptoms     - she was given abx in the ED     - CT  ab/pelvis pending     - she does have lactic acidosis     - continuing abx for now, check procal, hold abx if negative  Fall? Right hip pain     - Hip imaging pending     - pain control  Dementia     - continue home regimen when confirmed  Elevated troponin     - no chest pain     - EKG w/ previously seen RBBB     - troponins are flat 27 -> 30     - monitor on tele for now  Chronic bilateral lower extremity edema     - caregiver reports that she had a recent increase in her diuretics to help with the edema and it is improved     - it is possible that this is playing into her weakness     - renal function looks ok     - monitor  Hypokalemia     - replace K+, check Mg2+  HTN     - resume home regimen when confirmed  PAF     - resume home regimen when confirmed  Advance Care Planning: DNR confirmed w/ Beryle Flock (caregiver)  Consults: None  Family Communication: w/ [name] Rancourt at bedside  Severity of Illness: The appropriate patient status for this patient is OBSERVATION. Observation status is judged to be reasonable and necessary in order to provide the required intensity of service to ensure the patient's safety. The patient's presenting symptoms, physical exam findings, and initial radiographic and laboratory data in the context of their medical condition is felt to place them at decreased risk for further clinical deterioration. Furthermore, it is anticipated that the patient will be medically stable for discharge from the hospital within 2 midnights of admission.    Author: Jonnie Finner, DO 04/09/2021 2:32 PM  For on call review [email or web address].

## 2021-04-09 NOTE — ED Notes (Signed)
Purwick canister checked, no urine at this time.

## 2021-04-09 NOTE — Progress Notes (Signed)
A consult was received from an ED physician for vancomycin and cefepime per pharmacy dosing.  The patient's profile has been reviewed for ht/wt/allergies/indication/available labs.   A one time order has been placed for vancomycin 1g and cefepime 2g.  Further antibiotics/pharmacy consults should be ordered by admitting physician if indicated.                       Thank you, Peggyann Juba, PharmD, BCPS 04/09/2021  2:09 PM

## 2021-04-09 NOTE — ED Notes (Signed)
Dinner provided.

## 2021-04-09 NOTE — Progress Notes (Signed)
Pharmacy Antibiotic Note  Jasmine Jones is a 88 y.o. female admitted on 04/09/2021 with sepsis.  Pharmacy has been consulted for cefepime dosing.  Plan: Cefepime 2g IV q12h Follow up renal function & cultures    Temp (24hrs), Avg:97.9 F (36.6 C), Min:97.8 F (36.6 C), Max:97.9 F (36.6 C)  Recent Labs  Lab 04/09/21 1053 04/09/21 1358  WBC 9.2  --   CREATININE 0.79  --   LATICACIDVEN 2.9* 2.1*    Estimated Creatinine Clearance: 39.2 mL/min (by C-G formula based on SCr of 0.79 mg/dL).    No Known Allergies  Antimicrobials this admission: 2/3 Vancomycin x 1 2/3 Flagyl >> 2/3 Cefepime >>  Dose adjustments this admission:  Microbiology results: 2/3 BCx: 2/3 UCx: 2/3 Resp panel:  Thank you for allowing pharmacy to be a part of this patients care.  Peggyann Juba, PharmD, BCPS Pharmacy: [phone] 04/09/2021 3:05 PM

## 2021-04-09 NOTE — Progress Notes (Signed)
Elink following Sepsis bundle. 

## 2021-04-09 NOTE — ED Provider Notes (Signed)
[facility] DEPT Provider Note   CSN: [id number] Arrival date & time: 04/09/21  1021     History  Chief Complaint  Patient presents with   Weakness   Fever    Jasmine Jones is a 88 y.o. female.  Level 5 caveat secondary to dementia.  Patient is brought in by EMS from her facility for increased generalized weakness and fever. Patient unable to provide any history.  On review of prior notes it looks like she was having some weight gain leg swelling and increased shortness of breath and her PCP increased her diuretic. The history is provided by the EMS personnel and the patient.  Weakness Severity:  Moderate Onset quality:  Gradual Duration:  1 day Timing:  Constant Progression:  Unchanged Chronicity:  New Relieved by:  None tried Worsened by:  Activity Ineffective treatments:  None tried Associated symptoms: fever (??)   Fever     Home Medications Prior to Admission medications   Medication Sig Start Date End Date Taking? Authorizing Provider  acetaminophen (TYLENOL) 325 MG tablet Take 650 mg by mouth daily. Mid-day    [provider]  ALPRAZolam (XANAX) 0.25 MG tablet Take 0.25 mg by mouth every 6 (six) hours as needed for anxiety.    [provider]  amoxicillin (AMOXIL) 500 MG capsule Take 2,000 mg by mouth as directed. 1 hour prior to a dental appointment    [provider]  busPIRone (BUSPAR) 10 MG tablet Take 1 tablet (10 mg total) by mouth 3 (three) times daily. 06/14/19   [name], NP  Chloroxylenol-Zinc Oxide (BAZA EX) Apply 1 application topically as needed.    [provider]  docusate sodium (COLACE) 100 MG capsule Take 100 mg by mouth 2 (two) times daily as needed for mild constipation.    [provider]  Elastic Bandages & Supports (JOBST ACTIVE 20-30MMHG MEDIUM) MISC by Does not apply route. Provide every morning and remove every evening    [provider]   Emollient (EUCERIN) lotion Apply topically as needed for dry skin. Spread topically to both legs once daily    [provider]  famotidine (PEPCID) 20 MG tablet Take 1 tablet (20 mg total) by mouth at bedtime. 05/08/19   [name], NP  memantine (NAMENDA XR) 28 MG CP24 24 hr capsule Take 1 capsule (28 mg total) by mouth daily. 01/02/19   [place] Donnamarie Poag, DO  metoprolol succinate (TOPROL-XL) 25 MG 24 hr tablet Take 1 tablet (25 mg total) by mouth daily. 01/02/19 03/28/48  [place] Donnamarie Poag, DO  Multiple Vitamins-Minerals (EQ VISION FORMULA 50+ PO) Take 1 tablet by mouth every 12 (twelve) hours.    [provider]  polyethylene glycol (MIRALAX / GLYCOLAX) 17 g packet Take 17 g by mouth daily. Mix 17gm in liquid and drink by mouth once daily as needed for constipation    [provider]  potassium chloride SA (KLOR-CON M) 20 MEQ tablet Take 40 meq tablet by mouth daily x 5 days then resume 20 meq tablet daily. 03/29/21   Ngetich, [name] C, NP  QUEtiapine (SEROQUEL) 25 MG tablet Take 25 mg by mouth at bedtime.    [provider]  saccharomyces boulardii (FLORASTOR) 250 MG capsule Take 250 mg by mouth 2 (two) times daily.    [provider]  sertraline (ZOLOFT) 50 MG tablet Take 75 mg by mouth daily.    [provider]  torsemide (DEMADEX) 20 MG tablet  Take 2 tablet ( 40 mg tablet ) by mouth daily x 5 days then resume one ( 20)  tablet by mouth daily 03/29/21   Ngetich, [name] C, NP  traMADol (ULTRAM) 50 MG tablet Take 1 tablet (50 mg total) by mouth every 6 (six) hours as needed (while awake as needed). 03/29/21   [name], NP      Allergies    Patient has no known allergies.    Review of Systems   Review of Systems  Unable to perform ROS: Dementia  Constitutional:  Positive for fever (??).  Neurological:  Positive for weakness.   Physical Exam Updated Vital Signs BP (!) 150/89    Pulse 75    Temp 97.8 F (36.6 C) (Oral)    Resp  20    SpO2 93%  Physical Exam Vitals and nursing note reviewed.  Constitutional:      General: She is not in acute distress.    Appearance: Normal appearance. She is well-developed.  HENT:     Head: Normocephalic and atraumatic.  Eyes:     Conjunctiva/sclera: Conjunctivae normal.  Cardiovascular:     Rate and Rhythm: Normal rate and regular rhythm.     Heart sounds: No murmur heard. Pulmonary:     Effort: Pulmonary effort is normal. No respiratory distress.     Breath sounds: Normal breath sounds.  Abdominal:     Palpations: Abdomen is soft.     Tenderness: There is no abdominal tenderness. There is no guarding or rebound.  Musculoskeletal:        General: Signs of injury present. No swelling. Normal range of motion.     Cervical back: Neck supple.     Comments: She is got a scab on her right lower leg with a little bit of erythema around it.  Skin:    General: Skin is warm and dry.     Capillary Refill: Capillary refill takes less than 2 seconds.  Neurological:     General: No focal deficit present.     Mental Status: She is alert. She is disoriented.    ED Results / Procedures / Treatments   Labs (all labs ordered are listed, but only abnormal results are displayed) Labs Reviewed  LACTIC ACID, PLASMA - Abnormal; Notable for the following components:      Result Value   Lactic Acid, Venous 2.9 (*)    All other components within normal limits  LACTIC ACID, PLASMA - Abnormal; Notable for the following components:   Lactic Acid, Venous 2.1 (*)    All other components within normal limits  COMPREHENSIVE METABOLIC PANEL - Abnormal; Notable for the following components:   Potassium 3.4 (*)    Glucose, Bld 139 (*)    Total Protein 6.4 (*)    All other components within normal limits  CBC WITH DIFFERENTIAL/PLATELET - Abnormal; Notable for the following components:   MCH 25.6 (*)    All other components within normal limits  URINALYSIS, ROUTINE W REFLEX MICROSCOPIC -  Abnormal; Notable for the following components:   Leukocytes,Ua TRACE (*)    Bacteria, UA RARE (*)    All other components within normal limits  BRAIN NATRIURETIC PEPTIDE - Abnormal; Notable for the following components:   B Natriuretic Peptide 140.7 (*)    All other components within normal limits  TROPONIN I (HIGH SENSITIVITY) - Abnormal; Notable for the following components:   Troponin I (High Sensitivity) 27 (*)    All other components within normal  limits  TROPONIN I (HIGH SENSITIVITY) - Abnormal; Notable for the following components:   Troponin I (High Sensitivity) 30 (*)    All other components within normal limits  RESP PANEL BY RT-PCR (FLU A&B, COVID) ARPGX2  CULTURE, BLOOD (ROUTINE X 2)  CULTURE, BLOOD (ROUTINE X 2)  URINE CULTURE  PROTIME-INR  APTT  LIPASE, BLOOD  PROCALCITONIN  LACTIC ACID, PLASMA  LACTIC ACID, PLASMA  MAGNESIUM  PROTIME-INR  CORTISOL-AM, BLOOD  PROCALCITONIN  COMPREHENSIVE METABOLIC PANEL  CBC    EKG EKG Interpretation  Date/Time:  Friday April 09 2021 10:56:10 EST Ventricular Rate:  75 PR Interval:  207 QRS Duration: 145 QT Interval:  453 QTC Calculation: 506 R Axis:   -79 Text Interpretation: Sinus rhythm RBBB and LAFB Left ventricular hypertrophy No significant change since prior 10/20 Confirmed by Aletta Edouard [phone]) on 04/09/2021 11:05:26 AM  Radiology CT Abdomen Pelvis Wo Contrast  Result Date: 04/09/2021 CLINICAL DATA:  Abdominal pain EXAM: CT ABDOMEN AND PELVIS WITHOUT CONTRAST TECHNIQUE: Multidetector CT imaging of the abdomen and pelvis was performed following the standard protocol without IV contrast. RADIATION DOSE REDUCTION: This exam was performed according to the departmental dose-optimization program which includes automated exposure control, adjustment of the mA and/or kV according to patient size and/or use of iterative reconstruction technique. COMPARISON:  CT abdomen and pelvis 09/25/2017 FINDINGS: Lower chest: Heart  is enlarged. Irregular strandy and partially consolidative densities identified in the bilateral lung bases most prominent dependently, most likely representing atelectatic changes. Hepatobiliary: No focal liver abnormality is seen. No gallstones, gallbladder wall thickening, or biliary dilatation. Pancreas: Unremarkable. No pancreatic ductal dilatation or surrounding inflammatory changes. Spleen: Normal in size without focal abnormality. Adrenals/Urinary Tract: Adrenal glands appear within normal limits. Minimal to mild bilateral hydroureteronephrosis, left greater than right. No nephrolithiasis or obstructing calculi identified. The urinary bladder is overly distended with no wall mass visualized. Stomach/Bowel: Small hiatal hernia. No bowel obstruction, free air or pneumatosis. No bowel wall edema identified. Moderate amount of retained fecal material in the colon. Appendix not visualized. Vascular/Lymphatic: Severe atherosclerotic disease. No bulky lymphadenopathy identified. Reproductive: Partially calcified uterine fibroids. No suspicious adnexal mass identified. Other: No ascites. Musculoskeletal: Chronic severe compression fracture deformity of L3 similar to previous study. Severe degenerative changes of the lumbar spine. IMPRESSION: 1. Urinary bladder is significantly distended, correlate clinically. Likely associated minimal to mild bilateral hydroureteronephrosis. 2. Irregular and consolidative densities in the bilateral lung bases, most likely representing atelectatic changes. Correlate for possible pneumonia. 3. Multiple additional chronic findings as described. Electronically Signed   By: Ofilia Neas M.D.   On: 04/09/2021 15:21   CT Head Wo Contrast  Result Date: 04/09/2021 CLINICAL DATA:  Altered mental status EXAM: CT HEAD WITHOUT CONTRAST TECHNIQUE: Contiguous axial images were obtained from the base of the skull through the vertex without intravenous contrast. RADIATION DOSE REDUCTION:  This exam was performed according to the departmental dose-optimization program which includes automated exposure control, adjustment of the mA and/or kV according to patient size and/or use of iterative reconstruction technique. COMPARISON:  CT head 12/27/2018 FINDINGS: Brain: No acute intracranial hemorrhage, mass effect, or herniation. No extra-axial fluid collections. No evidence of acute territorial infarct. No hydrocephalus. 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 Mild cortical volume loss. Patchy hypodensities in the periventricular and subcortical white matter, likely secondary to chronic microvascular ischemic changes. Vascular: Calcified plaques in the carotid siphons. Skull: Normal. Negative for fracture or focal lesion. Sinuses/Orbits: No acute finding. Other: None. IMPRESSION: Chronic changes  with no acute intracranial process identified. Electronically Signed   By: Ofilia Neas M.D.   On: 04/09/2021 15:22   CT Hip Right Wo Contrast  Result Date: 04/09/2021 CLINICAL DATA:  Hip trauma, fracture suspected. Difficulty walking since last night. History of right hip fracture with fixation 12/28/2018. EXAM: CT OF THE RIGHT HIP WITHOUT CONTRAST TECHNIQUE: Multidetector CT imaging of the right hip was performed according to the standard protocol. Multiplanar CT image reconstructions were also generated. RADIATION DOSE REDUCTION: This exam was performed according to the departmental dose-optimization program which includes automated exposure control, adjustment of the mA and/or kV according to patient size and/or use of iterative reconstruction technique. COMPARISON:  Radiographs 12/27/2018 and 12/28/2018. Pelvic CT today. FINDINGS: Bones/Joint/Cartilage Status post right femoral dynamic screw and intramedullary nail fixation of a comminuted intertrochanteric femur fracture. The distal end of the intramedullary nail is not imaged. The visualized hardware is intact without loosening. Chronic  posttraumatic deformity related to the healed intertrochanteric fracture. No evidence of acute fracture or dislocation. There are right hip degenerative changes with a probable ossified loose body anteriorly. Ligaments Suboptimally assessed by CT. Muscles and Tendons Mild fatty atrophy of the gluteus musculature. Otherwise unremarkable. Soft tissues Iliofemoral atherosclerosis. Internal pelvic contents are dictated separately but include calcified uterine fibroids and bladder distension. IMPRESSION: 1. Posttraumatic deformity of the proximal right femur status post ORIF for an intertrochanteric fracture. 2. No evidence of acute fracture or dislocation. 3. No acute soft tissue findings are identified. Electronically Signed   By: Richardean Sale M.D.   On: 04/09/2021 15:21   DG Chest Port 1 View  Result Date: 04/09/2021 CLINICAL DATA:  Possible sepsis, fever EXAM: PORTABLE CHEST 1 VIEW COMPARISON:  05/01/2019 FINDINGS: Transverse diameter of heart is increased. There are no signs of pulmonary edema or focal pulmonary consolidation. There is minimal blunting of left lateral CP angle. There is no pneumothorax. Degenerative changes are noted in both shoulders, more severe on the left side. There is soft tissue calcification adjacent to the lateral margin of right scapula which has not changed significantly, possibly a loose body. IMPRESSION: Cardiomegaly. There are no signs of pulmonary edema or focal pulmonary consolidation. Blunting of left lateral CP angle may suggest minimal effusion or pleural thickening. Electronically Signed   By: Elmer Picker M.D.   On: 04/09/2021 11:27    Procedures Procedures    Medications Ordered in ED Medications  lactated ringers infusion ( Intravenous New Bag/Given (Non-Interop) 04/09/21 1624)  metroNIDAZOLE (FLAGYL) IVPB 500 mg (has no administration in time range)  acetaminophen (TYLENOL) tablet 650 mg (has no administration in time range)    Or  acetaminophen  (TYLENOL) suppository 650 mg (has no administration in time range)  HYDROcodone-acetaminophen (NORCO/VICODIN) 5-325 MG per tablet 1-2 tablet (has no administration in time range)  ondansetron (ZOFRAN) tablet 4 mg (has no administration in time range)    Or  ondansetron (ZOFRAN) injection 4 mg (has no administration in time range)  potassium chloride SA (KLOR-CON M) CR tablet 40 mEq (40 mEq Oral Given 04/09/21 1739)  ceFEPIme (MAXIPIME) 2 g in sodium chloride 0.9 % 100 mL IVPB (has no administration in time range)  LORazepam (ATIVAN) injection 0.5 mg (0.5 mg Intravenous Given 04/09/21 1734)  lactated ringers bolus 1,000 mL (0 mLs Intravenous Stopped 04/09/21 1450)  lactated ringers bolus 1,000 mL (0 mLs Intravenous Stopped 04/09/21 1624)  ceFEPIme (MAXIPIME) 2 g in sodium chloride 0.9 % 100 mL IVPB (0 g Intravenous Stopped 04/09/21 1616)  metroNIDAZOLE (FLAGYL) IVPB 500 mg (0 mg Intravenous Stopped 04/09/21 1616)  vancomycin (VANCOCIN) IVPB 1000 mg/200 mL premix (0 mg Intravenous Stopped 04/09/21 1624)    ED Course/ Medical Decision Making/ A&P Clinical Course as of 04/09/21 1741  Fri Apr 09, 2021  1127 Chest x-ray interpreted by me as no definitive infiltrate.  Awaiting radiology reading. [MB]  1139 Patient did receive the 1000 mg of Tylenol at facility before transfer. [MB]  1419 Lactate elevated no clear source of fever.  COVID and flu still pending.  Discussed with Dr. Marylyn Ishihara [facility] hospitalist.  Will get CT head and CT abdomen and pelvis.  Empiric antibiotics ordered. [MB]    Clinical Course User Index [MB] Hayden Rasmussen, MD                           Medical Decision Making Amount and/or Complexity of Data Reviewed Labs: ordered. Radiology: ordered. ECG/medicine tests: ordered.  Risk Prescription drug management. Decision regarding hospitalization.  Jasmine Jones was evaluated in Emergency Department on 04/09/2021 for the symptoms described in the history of present illness. She was  evaluated in the context of the global COVID-19 pandemic, which necessitated consideration that the patient might be at risk for infection with the SARS-CoV-2 virus that causes COVID-19. Institutional protocols and algorithms that pertain to the evaluation of patients at risk for COVID-19 are in a state of rapid change based on information released by regulatory bodies including the CDC and federal and state organizations. These policies and algorithms were followed during the patient's care in the ED. This patient complains of fever generalized weakness; this involves an extensive number of treatment Options and is a complaint that carries with it a high risk of complications and Morbidity. The differential includes sepsis, Sirs, pneumonia, UTI, COVID, flu, dehydration  I ordered, reviewed and interpreted labs, which included CBC with normal white count normal hemoglobin, chemistries currently normal, glucose, urinalysis without clear signs of infection.  Opponent is elevated need to be trended, lactate elevated and improving after fluids, COVID and flu negative I ordered medication IV fluids IV antibiotic I ordered imaging studies which included chest x-ray head CT abdominal CT right hip CT and I independently    visualized and interpreted imaging which showed do not show any clear sign of infection Additional history obtained from EMS and patient's caregiver Previous records obtained and reviewed in epic no recent admissions.  Did review primary care notes. I consulted [facility] hospital[facility] and discussed lab and imaging findings  Critical Interventions: Work-up and management of patient's elevated lactate with fluids and antibiotics  After the interventions stated above, I reevaluated the patient and found patient will need admission to the hospital for further work-up of her elevated lactate possible sepsis.          Final Clinical Impression(s) / ED Diagnoses Final diagnoses:   Sepsis, due to unspecified organism, unspecified whether acute organ dysfunction present [facility])  Generalized weakness    Rx / DC Orders ED Discharge Orders     None         Hayden Rasmussen, MD 04/09/21 1745

## 2021-04-09 NOTE — ED Notes (Signed)
CRITICAL VALUE STICKER  CRITICAL VALUE: Lactic 2.9  DATE & TIME NOTIFIED: 04/09/2021 1218  MD NOTIFIED: Melina Copa MD  TIME OF NOTIFICATION: 1219

## 2021-04-09 NOTE — ED Triage Notes (Signed)
Pt BIB EMS from [facility]. After dinner last night she began having difficulty walking. Pt has fever of 102 this upon EMS arrival. Per facility mentation is baseline.  1000mg  @1001am 

## 2021-04-10 DIAGNOSIS — Z515 Encounter for palliative care: Secondary | ICD-10-CM | POA: Diagnosis not present

## 2021-04-10 DIAGNOSIS — E872 Acidosis, unspecified: Secondary | ICD-10-CM | POA: Diagnosis present

## 2021-04-10 DIAGNOSIS — F0390 Unspecified dementia without behavioral disturbance: Secondary | ICD-10-CM | POA: Diagnosis present

## 2021-04-10 DIAGNOSIS — I4510 Unspecified right bundle-branch block: Secondary | ICD-10-CM | POA: Diagnosis present

## 2021-04-10 DIAGNOSIS — J189 Pneumonia, unspecified organism: Secondary | ICD-10-CM | POA: Diagnosis present

## 2021-04-10 DIAGNOSIS — I480 Paroxysmal atrial fibrillation: Secondary | ICD-10-CM | POA: Diagnosis present

## 2021-04-10 DIAGNOSIS — R600 Localized edema: Secondary | ICD-10-CM | POA: Diagnosis present

## 2021-04-10 DIAGNOSIS — G9341 Metabolic encephalopathy: Secondary | ICD-10-CM | POA: Diagnosis present

## 2021-04-10 DIAGNOSIS — K219 Gastro-esophageal reflux disease without esophagitis: Secondary | ICD-10-CM | POA: Diagnosis present

## 2021-04-10 DIAGNOSIS — Z833 Family history of diabetes mellitus: Secondary | ICD-10-CM | POA: Diagnosis not present

## 2021-04-10 DIAGNOSIS — Z20822 Contact with and (suspected) exposure to covid-19: Secondary | ICD-10-CM | POA: Diagnosis present

## 2021-04-10 DIAGNOSIS — W1830XA Fall on same level, unspecified, initial encounter: Secondary | ICD-10-CM | POA: Diagnosis present

## 2021-04-10 DIAGNOSIS — E876 Hypokalemia: Secondary | ICD-10-CM | POA: Diagnosis present

## 2021-04-10 DIAGNOSIS — I248 Other forms of acute ischemic heart disease: Secondary | ICD-10-CM | POA: Diagnosis present

## 2021-04-10 DIAGNOSIS — R531 Weakness: Secondary | ICD-10-CM | POA: Diagnosis not present

## 2021-04-10 DIAGNOSIS — A419 Sepsis, unspecified organism: Secondary | ICD-10-CM | POA: Diagnosis not present

## 2021-04-10 DIAGNOSIS — E559 Vitamin D deficiency, unspecified: Secondary | ICD-10-CM | POA: Diagnosis present

## 2021-04-10 DIAGNOSIS — R1310 Dysphagia, unspecified: Secondary | ICD-10-CM | POA: Diagnosis present

## 2021-04-10 DIAGNOSIS — F03C11 Unspecified dementia, severe, with agitation: Secondary | ICD-10-CM | POA: Diagnosis not present

## 2021-04-10 DIAGNOSIS — H9190 Unspecified hearing loss, unspecified ear: Secondary | ICD-10-CM | POA: Diagnosis present

## 2021-04-10 DIAGNOSIS — R778 Other specified abnormalities of plasma proteins: Secondary | ICD-10-CM | POA: Diagnosis present

## 2021-04-10 DIAGNOSIS — R509 Fever, unspecified: Secondary | ICD-10-CM | POA: Diagnosis present

## 2021-04-10 DIAGNOSIS — Z66 Do not resuscitate: Secondary | ICD-10-CM | POA: Diagnosis present

## 2021-04-10 DIAGNOSIS — F419 Anxiety disorder, unspecified: Secondary | ICD-10-CM | POA: Diagnosis present

## 2021-04-10 DIAGNOSIS — Z7189 Other specified counseling: Secondary | ICD-10-CM | POA: Diagnosis not present

## 2021-04-10 DIAGNOSIS — R262 Difficulty in walking, not elsewhere classified: Secondary | ICD-10-CM | POA: Diagnosis present

## 2021-04-10 DIAGNOSIS — E785 Hyperlipidemia, unspecified: Secondary | ICD-10-CM | POA: Diagnosis present

## 2021-04-10 DIAGNOSIS — I10 Essential (primary) hypertension: Secondary | ICD-10-CM | POA: Diagnosis present

## 2021-04-10 DIAGNOSIS — Z85828 Personal history of other malignant neoplasm of skin: Secondary | ICD-10-CM | POA: Diagnosis not present

## 2021-04-10 DIAGNOSIS — Y92121 Bathroom in nursing home as the place of occurrence of the external cause: Secondary | ICD-10-CM | POA: Diagnosis not present

## 2021-04-10 LAB — COMPREHENSIVE METABOLIC PANEL
ALT: 12 U/L (ref 0–44)
AST: 16 U/L (ref 15–41)
Albumin: 3.1 g/dL — ABNORMAL LOW (ref 3.5–5.0)
Alkaline Phosphatase: 76 U/L (ref 38–126)
Anion gap: 7 (ref 5–15)
BUN: 15 mg/dL (ref 8–23)
CO2: 27 mmol/L (ref 22–32)
Calcium: 8.1 mg/dL — ABNORMAL LOW (ref 8.9–10.3)
Chloride: 105 mmol/L (ref 98–111)
Creatinine, Ser: 0.59 mg/dL (ref 0.44–1.00)
GFR, Estimated: >60 mL/min (ref >60)
Glucose, Bld: 91 mg/dL (ref 70–99)
Potassium: 3 mmol/L — ABNORMAL LOW (ref 3.5–5.1)
Sodium: 139 mmol/L (ref 135–145)
Total Bilirubin: 1.2 mg/dL (ref 0.3–1.2)
Total Protein: 5.3 g/dL — ABNORMAL LOW (ref 6.5–8.1)

## 2021-04-10 LAB — CBC
HCT: 35.1 % — ABNORMAL LOW (ref 36.0–46.0)
Hemoglobin: 11 g/dL — ABNORMAL LOW (ref 12.0–15.0)
MCH: 26 pg (ref 26.0–34.0)
MCHC: 31.3 g/dL (ref 30.0–36.0)
MCV: 83 fL (ref 80.0–100.0)
Platelets: 153 10*3/uL (ref 150–400)
RBC: 4.23 MIL/uL (ref 3.87–5.11)
RDW: 14.9 % (ref 11.5–15.5)
WBC: 8 10*3/uL (ref 4.0–10.5)
nRBC: 0 % (ref 0.0–0.2)

## 2021-04-10 LAB — URINE CULTURE: Culture: NO GROWTH

## 2021-04-10 LAB — PROTIME-INR
INR: 1.1 (ref 0.8–1.2)
Prothrombin Time: 14.5 seconds (ref 11.4–15.2)

## 2021-04-10 LAB — CORTISOL-AM, BLOOD: Cortisol - AM: 11.2 ug/dL (ref 6.7–22.6)

## 2021-04-10 LAB — PROCALCITONIN: Procalcitonin: <0.1 ng/mL

## 2021-04-10 LAB — LACTIC ACID, PLASMA: Lactic Acid, Venous: 1.2 mmol/L (ref 0.5–1.9)

## 2021-04-10 MED ORDER — SODIUM CHLORIDE 0.9 % IV SOLN: INTRAVENOUS | Status: DC

## 2021-04-10 MED ORDER — DOCUSATE SODIUM 100 MG PO CAPS: 100.0000 mg | ORAL_CAPSULE | Freq: Two times a day (BID) | ORAL | Status: DC | PRN

## 2021-04-10 MED ORDER — CHLORHEXIDINE GLUCONATE CLOTH 2 % EX PADS
6.0000 | MEDICATED_PAD | Freq: Every day | CUTANEOUS | Status: DC
  Administered 2021-04-10 – 2021-04-14 (×4): 6 via TOPICAL

## 2021-04-10 MED ORDER — BUSPIRONE HCL 10 MG PO TABS
10.0000 mg | ORAL_TABLET | Freq: Three times a day (TID) | ORAL | Status: DC
  Administered 2021-04-11 – 2021-04-20 (×29): 10 mg via ORAL
  Filled 2021-04-10 (×30): qty 1

## 2021-04-10 MED ORDER — SERTRALINE HCL 50 MG PO TABS
75.0000 mg | ORAL_TABLET | Freq: Every morning | ORAL | Status: DC
  Administered 2021-04-11 – 2021-04-20 (×10): 75 mg via ORAL
  Filled 2021-04-10 (×10): qty 1

## 2021-04-10 MED ORDER — METOPROLOL SUCCINATE ER 25 MG PO TB24
25.0000 mg | ORAL_TABLET | Freq: Every day | ORAL | Status: DC
Start: 2021-04-10 — End: 2021-04-21
  Administered 2021-04-11 – 2021-04-20 (×10): 25 mg via ORAL
  Filled 2021-04-10 (×10): qty 1

## 2021-04-10 MED ORDER — TORSEMIDE 20 MG PO TABS
20.0000 mg | ORAL_TABLET | Freq: Every day | ORAL | Status: DC
  Administered 2021-04-11 – 2021-04-15 (×5): 20 mg via ORAL
  Filled 2021-04-10 (×8): qty 1

## 2021-04-10 MED ORDER — POTASSIUM CHLORIDE 20 MEQ PO PACK: 40.0000 meq | PACK | Freq: Once | ORAL | Status: DC

## 2021-04-10 MED ORDER — QUETIAPINE FUMARATE 25 MG PO TABS
25.0000 mg | ORAL_TABLET | Freq: Every day | ORAL | Status: DC
  Administered 2021-04-10 – 2021-04-20 (×11): 25 mg via ORAL
  Filled 2021-04-10 (×11): qty 1

## 2021-04-10 MED ORDER — MEMANTINE HCL ER 28 MG PO CP24
28.0000 mg | ORAL_CAPSULE | Freq: Every day | ORAL | Status: DC
Start: 2021-04-10 — End: 2021-04-21
  Administered 2021-04-11 – 2021-04-20 (×10): 28 mg via ORAL
  Filled 2021-04-10 (×13): qty 1

## 2021-04-10 MED ORDER — FAMOTIDINE 20 MG PO TABS
20.0000 mg | ORAL_TABLET | Freq: Every day | ORAL | Status: DC
  Administered 2021-04-10 – 2021-04-14 (×5): 20 mg via ORAL
  Filled 2021-04-10 (×5): qty 1

## 2021-04-10 NOTE — Progress Notes (Signed)
Have alerted MD to bladder scan of 853cc. Awaiting orders.

## 2021-04-10 NOTE — Evaluation (Signed)
Clinical/Bedside Swallow Evaluation Patient Details  Name: Jasmine Jones MRN: [id number] Date of Birth: [birth year]  Today's Date: 04/10/2021 Time: SLP Start Time (ACUTE ONLY): 67 SLP Stop Time (ACUTE ONLY): 1512 SLP Time Calculation (min) (ACUTE ONLY): 1400 min  Past Medical History:  Past Medical History:  Diagnosis Date   A-fib ([place])    Anemia    Anxiety    Dry skin    on ears   Gastroesophageal reflux    Hearing loss    significant  uses hearing aids   High blood pressure    Hyperlipidemia    Loss of appetite    Loss of smell    Macular degeneration    Malnutrition ([facility])    Osteoporosis    Persistent dry cough    Skin cancer    basal / squamous   Slow transit constipation    abstracted from new patient packet    Vitamin D deficiency 01/26/2018   Past Surgical History:  Past Surgical History:  Procedure Laterality Date   FEMUR IM NAIL Right 12/28/2018   Procedure: INTRAMEDULLARY (IM) NAIL FEMORAL;  Surgeon: Dorna Leitz, MD;  Location: [facility] ORS;  Service: Orthopedics;  Laterality: Right;   HPI:  88 y.o. female presented to ED from [facility] with fever and weakness.  PMHx anxiety, advancing dementia with increased irritability and refusals to eat, GERD, afib, HTN. CT head no acute finding. DG chest 2/3 no signs of pulm edema or consolidation.    Assessment / Plan / Recommendation  Clinical Impression  Pt accepted very limited amounts of PO - allowing 1-2 sips or bites, then refusing further POs.  There were no focal deficits.  Tongue was covered in pill residue - nurse and SLP efforts to remove were met with escalating irritation/agitation from pt, so further oral care was not pursued. She accepted water from a straw with good oral seal, functional swallowing with no s/sx aspiration.  After several sips she was observed to hold water in her mouth, requiring verbal cues to swallow.  Demonstrated similar performance with purees.   There does not appear to  be a mechanical dysphagia present; however, pt's cognitive deficits are impacting willingness to eat and affecting attention, leading to oral holding.  Recommend continuing efforts to feed oral diet; try giving meds whole in puree (pudding/applesauce).  Unfortunately, pt has been refusing meds and this may continue.  If palliative care has not been consulted in the past, this admission may be a good opportunity to get their service involved.  SLP will follow briefly. D/W RN.   SLP Visit Diagnosis: Dysphagia, oral phase (R13.11)           Diet Recommendation   Dysphagia 3/thin liquid  Medication Administration: Whole meds with liquid    Other  Recommendations Recommended Consults: Other (Comment) (consider Palliative Care) Oral Care Recommendations: Oral care BID    Recommendations for follow up therapy are one component of a multi-disciplinary discharge planning process, led by the attending physician.  Recommendations may be updated based on patient status, additional functional criteria and insurance authorization.  Follow up Recommendations No SLP follow up      Assistance Recommended at Discharge    Functional Status Assessment    Frequency and Duration min 2x/week  1 week       Prognosis Barriers to Reach Goals: Cognitive deficits      Swallow Study   General HPI: 88 y.o. female presented to ED from [facility] with fever  and weakness.  PMHx anxiety, advancing dementia with increased irritability and refusals to eat, GERD, afib, HTN. CT head no acute finding. DG chest 2/3 no signs of pulm edema or consolidation. Type of Study: Bedside Swallow Evaluation Previous Swallow Assessment: no Temperature Spikes Noted: No Respiratory Status: Nasal cannula History of Recent Intubation: No Behavior/Cognition: Alert;Confused Oral Cavity Assessment: Dry Oral Care Completed by SLP:  (refused, pushing clinician away) Oral Cavity - Dentition: Adequate natural  dentition Self-Feeding Abilities: Needs assist Patient Positioning: Upright in bed Baseline Vocal Quality: Normal Volitional Cough: Cognitively unable to elicit Volitional Swallow: Unable to elicit    Oral/Motor/Sensory Function Overall Oral Motor/Sensory Function: Within functional limits   Ice Chips Ice chips: Not tested   Thin Liquid Thin Liquid: Within functional limits    Nectar Thick Nectar Thick Liquid: Not tested   Honey Thick Honey Thick Liquid: Not tested   Puree Puree: Within functional limits   Solid     Solid: Not tested (refused)      Juan Quam Laurice 04/10/2021,3:22 PM  Estill Bamberg L. Tivis Ringer, [place] Office number [phone] Pager [phone]

## 2021-04-10 NOTE — Progress Notes (Addendum)
PROGRESS NOTE    Jasmine Jones  IOE:[id number] DOB: 1926/02/24 DOA: 04/09/2021  PCP: [name], NP   Brief Narrative:  This 88 years old female with PMH significant for anxiety, dementia, GERD, A-fib, hypertension presented in the ED with generalized weakness and febrile illness.  History is difficult to obtain from patient as she has significant dementia and caregiver is unclear of what happened.  The caregiver reports that the patient was found to be confused, she seems to move slower than her usual self and getting about her day.  She was also having difficulty walking and  for some reason they decided to check her temperature it was found to be 102.4,  they gave her Tylenol and sent to the ED.  Patient also reported she fell in the bathroom last night and complains of right hip pain She is unable to tell the mechanism of the fall. The caregiver at bedside reported she fell last week and that she is often confused about timelines for certain events.  Patient is admitted for fever of unknown origin.  Assessment & Plan:   Principal Problem:   Fever of unknown origin Active Problems:   Essential hypertension   ATRIAL FIBRILLATION   Dementia (HCC)   Hypokalemia  Fever of unknown origin: Lactic acidosis: Patient presented with fever, no leukocytosis, UA and chest x-ray unremarkable. No fever recorded here.  Influenza negative, COVID-negative Denies any diarrhea or urinary symptoms. We will continue empiric antibiotics(Cipro and Flagyl.). Procalcitonin 0.10, lactic acid normalized with IV hydration. CT A/P:Irregular and consolidative densities in the bilateral lung bases, most likely representing possible pneumonia.  Hypokalemia: Replaced.  Continue to monitor  Right hip pain status post fall: Continue adequate pain control. Posttraumatic deformity of the proximal right femur status post ORIF for an intertrochanteric fracture.  No acute fracture or dislocation  noted.  Dementia: Continue Namenda and Seroquel.  Elevated troponins: Denies any chest pain, EKG with previously seen RBBB Troponins are flat 27> 30 Continue telemetry  Essential hypertension: Continue metoprolol.    Paroxysmal A-fib: Continue metoprolol  Chronic bilateral lower extremity edema: Torsemide 20 mg daily Monitor daily weight, intake output charting.  DVT prophylaxis: Lovenox Code Status: DNR Family Communication: Caregiver at bedside Disposition Plan:   Status is: Observation The patient remains OBS appropriate and will d/c before 2 midnights.  Admitted for fever of unknown origin, requiring IV antibiotics and work-up.  Cultures are pending.  Consultants:  None Procedures: None.  Antimicrobials:   Anti-infectives (From admission, onward)    Start     Dose/Rate Route Frequency Ordered Stop   04/10/21 0300  ceFEPIme (MAXIPIME) 2 g in sodium chloride 0.9 % 100 mL IVPB        2 g 200 mL/hr over 30 Minutes Intravenous Every 12 hours 04/09/21 1509     04/10/21 0200  metroNIDAZOLE (FLAGYL) IVPB 500 mg        500 mg 100 mL/hr over 60 Minutes Intravenous Every 12 hours 04/09/21 1617 04/17/21 0159   04/09/21 1415  ceFEPIme (MAXIPIME) 2 g in sodium chloride 0.9 % 100 mL IVPB        2 g 200 mL/hr over 30 Minutes Intravenous  Once 04/09/21 1404 04/09/21 1616   04/09/21 1415  metroNIDAZOLE (FLAGYL) IVPB 500 mg        500 mg 100 mL/hr over 60 Minutes Intravenous  Once 04/09/21 1404 04/09/21 1616   04/09/21 1415  vancomycin (VANCOCIN) IVPB 1000 mg/200 mL premix  1,000 mg 200 mL/hr over 60 Minutes Intravenous  Once 04/09/21 1404 04/09/21 1624        Subjective: Patient was seen and examined at bedside.  Overnight events noted. Patient is confused, but following commands.  She does have intermittent agitations.  Objective: Vitals:   04/10/21 0135 04/10/21 0300 04/10/21 0633 04/10/21 1104  BP: 111/77 118/64 113/66 111/63  Pulse: 69 72 69 74  Resp: 20  18 16 15   Temp:   98.8 F (37.1 C) 97.6 F (36.4 C)  TempSrc:   Axillary Oral  SpO2: 97% 92% 99% 98%    Intake/Output Summary (Last 24 hours) at 04/10/2021 1430 Last data filed at 04/10/2021 0830 Gross per 24 hour  Intake 2300.32 ml  Output 900 ml  Net 1400.32 ml   There were no vitals filed for this visit.  Examination:  General exam: Appears comfortable, not in any acute distress deconditioned. Respiratory system: Clear to auscultation bilaterally, respiratory effort normal, RR 15 Cardiovascular system: S1 & S2 heard, regular rate and rhythm, no murmur. Gastrointestinal system: Abdomen is soft, nontender, nondistended, BS+ Central nervous system: Alert and oriented x 1 . No focal neurological deficits. Extremities: +Edema, no cyanosis, no clubbing. Skin: No rashes, lesions or ulcers Psychiatry: . Mood & affect appropriate.     Data Reviewed: I have personally reviewed following labs and imaging studies  CBC: Recent Labs  Lab 04/09/21 1053 04/10/21 0708  WBC 9.2 8.0  NEUTROABS 7.1  --   HGB 12.9 11.0*  HCT 41.0 35.1*  MCV 81.5 83.0  PLT 191 017   Basic Metabolic Panel: Recent Labs  Lab 04/09/21 1053 04/09/21 1748 04/10/21 0708  NA 140  --  139  K 3.4*  --  3.0*  CL 104  --  105  CO2 24  --  27  GLUCOSE 139*  --  91  BUN 18  --  15  CREATININE 0.79  --  0.59  CALCIUM 9.0  --  8.1*  MG  --  2.0  --    GFR: Estimated Creatinine Clearance: 39.2 mL/min (by C-G formula based on SCr of 0.59 mg/dL). Liver Function Tests: Recent Labs  Lab 04/09/21 1053 04/10/21 0708  AST 26 16  ALT 16 12  ALKPHOS 97 76  BILITOT 0.9 1.2  PROT 6.4* 5.3*  ALBUMIN 3.7 3.1*   Recent Labs  Lab 04/09/21 1053  LIPASE 32   No results for input(s): AMMONIA in the last 168 hours. Coagulation Profile: Recent Labs  Lab 04/09/21 1053 04/10/21 0708  INR 1.0 1.1   Cardiac Enzymes: No results for input(s): CKTOTAL, CKMB, CKMBINDEX, TROPONINI in the last 168 hours. BNP (last  3 results) No results for input(s): PROBNP in the last 8760 hours. HbA1C: No results for input(s): HGBA1C in the last 72 hours. CBG: No results for input(s): GLUCAP in the last 168 hours. Lipid Profile: No results for input(s): CHOL, HDL, LDLCALC, TRIG, CHOLHDL, LDLDIRECT in the last 72 hours. Thyroid Function Tests: No results for input(s): TSH, T4TOTAL, FREET4, T3FREE, THYROIDAB in the last 72 hours. Anemia Panel: No results for input(s): VITAMINB12, FOLATE, FERRITIN, TIBC, IRON, RETICCTPCT in the last 72 hours. Sepsis Labs: Recent Labs  Lab 04/09/21 1053 04/09/21 1358 04/09/21 1432 04/09/21 1748 04/09/21 2358 04/10/21 0708  PROCALCITON  --   --  <0.10  --   --  <0.10  LATICACIDVEN 2.9* 2.1*  --  1.2 1.2  --     Recent Results (from the past 240 hour(s))  Blood Culture (routine x 2)     Status: None (Preliminary result)   Collection Time: 04/09/21 10:53 AM   Specimen: BLOOD  Result Value Ref Range Status   Specimen Description   Final    BLOOD BLOOD LEFT FOREARM Performed at [facility] [address], [place], [place] [address]    Special Requests   Final    BOTTLES DRAWN AEROBIC AND ANAEROBIC Blood Culture adequate volume Performed at [facility] [address] [place], [place] [address]    Culture   Final    NO GROWTH < 24 HOURS Performed at [facility] [address], [place], [place] [address]    Report Status PENDING  Incomplete  Blood Culture (routine x 2)     Status: None (Preliminary result)   Collection Time: 04/09/21 10:55 AM   Specimen: BLOOD  Result Value Ref Range Status   Specimen Description   Final    BLOOD RIGHT ANTECUBITAL Performed at [facility] [address] [place], [place] [address]    Special Requests   Final    BOTTLES DRAWN AEROBIC AND ANAEROBIC Blood Culture adequate volume Performed at [facility] [address] [place], [place]  [address]    Culture   Final    NO GROWTH < 24 HOURS Performed at [facility] [address] [place], [place] [address]    Report Status PENDING  Incomplete  Urine Culture     Status: None   Collection Time: 04/09/21  1:58 PM   Specimen: In/Out Cath Urine  Result Value Ref Range Status   Specimen Description   Final    IN/OUT CATH URINE Performed at [facility] [address] [place], [place] [address]    Special Requests   Final    NONE Performed at [facility], [place] [address], [place], [place] [address]    Culture   Final    NO GROWTH Performed at [facility], [place] [address], [place],  [address]    Report Status 04/10/2021 FINAL  Final  Resp Panel by RT-PCR (Flu A&B, Covid) Nasopharyngeal Swab     Status: None   Collection Time: 04/09/21  3:22 PM   Specimen: Nasopharyngeal Swab; Nasopharyngeal(NP) swabs in vial transport medium  Result Value Ref Range Status   SARS Coronavirus 2 by RT PCR NEGATIVE NEGATIVE Final    Comment: (NOTE) SARS-CoV-2 target nucleic acids are NOT DETECTED.  The SARS-CoV-2 RNA is generally detectable in upper respiratory specimens during the acute phase of infection. The lowest concentration of SARS-CoV-2 viral copies this assay can detect is 138 copies/mL. A negative result does not preclude SARS-Cov-2 infection and should not be used as the sole basis for treatment or other patient management decisions. A negative result may occur with  improper specimen collection/handling, submission of specimen other than nasopharyngeal swab, presence of viral mutation(s) within the areas targeted by this assay, and inadequate number of viral copies(<138 copies/mL). A negative result must be combined with clinical observations, patient history, and epidemiological information. The expected result is Negative.  Fact Sheet for Patients:  EntrepreneurPulse.com.au  Fact Sheet for  Healthcare Providers:  IncredibleEmployment.be  This test is no t yet approved or cleared by the [place] FDA and  has been authorized for detection and/or diagnosis of SARS-CoV-2 by FDA under an Emergency Use Authorization (EUA). This EUA will remain  in effect (meaning this test  can be used) for the duration of the COVID-19 declaration under Section 564(b)(1) of the Act, 21 U.S.C.section 360bbb-3(b)(1), unless the authorization is terminated  or revoked sooner.       Influenza A by PCR NEGATIVE NEGATIVE Final   Influenza B by PCR NEGATIVE NEGATIVE Final    Comment: (NOTE) The Xpert Xpress SARS-CoV-2/FLU/RSV plus assay is intended as an aid in the diagnosis of influenza from Nasopharyngeal swab specimens and should not be used as a sole basis for treatment. Nasal washings and aspirates are unacceptable for Xpert Xpress SARS-CoV-2/FLU/RSV testing.  Fact Sheet for Patients: EntrepreneurPulse.com.au  Fact Sheet for Healthcare Providers: IncredibleEmployment.be  This test is not yet approved or cleared by the [place] FDA and has been authorized for detection and/or diagnosis of SARS-CoV-2 by FDA under an Emergency Use Authorization (EUA). This EUA will remain in effect (meaning this test can be used) for the duration of the COVID-19 declaration under Section 564(b)(1) of the Act, 21 U.S.C. section 360bbb-3(b)(1), unless the authorization is terminated or revoked.  Performed at [facility], [place] [address], [place], [place] [address]    Radiology Studies: CT Abdomen Pelvis Wo Contrast  Result Date: 04/09/2021 CLINICAL DATA:  Abdominal pain EXAM: CT ABDOMEN AND PELVIS WITHOUT CONTRAST TECHNIQUE: Multidetector CT imaging of the abdomen and pelvis was performed following the standard protocol without IV contrast. RADIATION DOSE REDUCTION: This exam was performed according to the departmental  dose-optimization program which includes automated exposure control, adjustment of the mA and/or kV according to patient size and/or use of iterative reconstruction technique. COMPARISON:  CT abdomen and pelvis 09/25/2017 FINDINGS: Lower chest: Heart is enlarged. Irregular strandy and partially consolidative densities identified in the bilateral lung bases most prominent dependently, most likely representing atelectatic changes. Hepatobiliary: No focal liver abnormality is seen. No gallstones, gallbladder wall thickening, or biliary dilatation. Pancreas: Unremarkable. No pancreatic ductal dilatation or surrounding inflammatory changes. Spleen: Normal in size without focal abnormality. Adrenals/Urinary Tract: Adrenal glands appear within normal limits. Minimal to mild bilateral hydroureteronephrosis, left greater than right. No nephrolithiasis or obstructing calculi identified. The urinary bladder is overly distended with no wall mass visualized. Stomach/Bowel: Small hiatal hernia. No bowel obstruction, free air or pneumatosis. No bowel wall edema identified. Moderate amount of retained fecal material in the colon. Appendix not visualized. Vascular/Lymphatic: Severe atherosclerotic disease. No bulky lymphadenopathy identified. Reproductive: Partially calcified uterine fibroids. No suspicious adnexal mass identified. Other: No ascites. Musculoskeletal: Chronic severe compression fracture deformity of L3 similar to previous study. Severe degenerative changes of the lumbar spine. IMPRESSION: 1. Urinary bladder is significantly distended, correlate clinically. Likely associated minimal to mild bilateral hydroureteronephrosis. 2. Irregular and consolidative densities in the bilateral lung bases, most likely representing atelectatic changes. Correlate for possible pneumonia. 3. Multiple additional chronic findings as described. Electronically Signed   By: Ofilia Neas M.D.   On: 04/09/2021 15:21   CT Head Wo  Contrast  Result Date: 04/09/2021 CLINICAL DATA:  Altered mental status EXAM: CT HEAD WITHOUT CONTRAST TECHNIQUE: Contiguous axial images were obtained from the base of the skull through the vertex without intravenous contrast. RADIATION DOSE REDUCTION: This exam was performed according to the departmental dose-optimization program which includes automated exposure control, adjustment of the mA and/or kV according to patient size and/or use of iterative reconstruction technique. COMPARISON:  CT head 12/27/2018 FINDINGS: Brain: No acute intracranial hemorrhage, mass effect, or herniation. No extra-axial fluid collections. No evidence of acute territorial infarct. No hydrocephalus. 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 Mild cortical volume loss. Patchy hypodensities in the periventricular and subcortical white matter, likely secondary to chronic microvascular ischemic changes. Vascular: Calcified plaques in the carotid siphons. Skull: Normal. Negative for fracture or focal lesion. Sinuses/Orbits: No acute finding. Other: None. IMPRESSION: Chronic changes with no acute intracranial process identified. Electronically Signed   By: Ofilia Neas M.D.   On: 04/09/2021 15:22   CT Hip Right Wo Contrast  Result Date: 04/09/2021 CLINICAL DATA:  Hip trauma, fracture suspected. Difficulty walking since last night. History of right hip fracture with fixation 12/28/2018. EXAM: CT OF THE RIGHT HIP WITHOUT CONTRAST TECHNIQUE: Multidetector CT imaging of the right hip was performed according to the standard protocol. Multiplanar CT image reconstructions were also generated. RADIATION DOSE REDUCTION: This exam was performed according to the departmental dose-optimization program which includes automated exposure control, adjustment of the mA and/or kV according to patient size and/or use of iterative reconstruction technique. COMPARISON:  Radiographs 12/27/2018 and 12/28/2018. Pelvic CT today. FINDINGS:  Bones/Joint/Cartilage Status post right femoral dynamic screw and intramedullary nail fixation of a comminuted intertrochanteric femur fracture. The distal end of the intramedullary nail is not imaged. The visualized hardware is intact without loosening. Chronic posttraumatic deformity related to the healed intertrochanteric fracture. No evidence of acute fracture or dislocation. There are right hip degenerative changes with a probable ossified loose body anteriorly. Ligaments Suboptimally assessed by CT. Muscles and Tendons Mild fatty atrophy of the gluteus musculature. Otherwise unremarkable. Soft tissues Iliofemoral atherosclerosis. Internal pelvic contents are dictated separately but include calcified uterine fibroids and bladder distension. IMPRESSION: 1. Posttraumatic deformity of the proximal right femur status post ORIF for an intertrochanteric fracture. 2. No evidence of acute fracture or dislocation. 3. No acute soft tissue findings are identified. Electronically Signed   By: Richardean Sale M.D.   On: 04/09/2021 15:21   DG Chest Port 1 View  Result Date: 04/09/2021 CLINICAL DATA:  Possible sepsis, fever EXAM: PORTABLE CHEST 1 VIEW COMPARISON:  05/01/2019 FINDINGS: Transverse diameter of heart is increased. There are no signs of pulmonary edema or focal pulmonary consolidation. There is minimal blunting of left lateral CP angle. There is no pneumothorax. Degenerative changes are noted in both shoulders, more severe on the left side. There is soft tissue calcification adjacent to the lateral margin of right scapula which has not changed significantly, possibly a loose body. IMPRESSION: Cardiomegaly. There are no signs of pulmonary edema or focal pulmonary consolidation. Blunting of left lateral CP angle may suggest minimal effusion or pleural thickening. Electronically Signed   By: Elmer Picker M.D.   On: 04/09/2021 11:27    Scheduled Meds:  busPIRone  10 mg Oral TID   Chlorhexidine Gluconate  Cloth  6 each Topical Daily   famotidine  20 mg Oral QHS   memantine  28 mg Oral Daily   metoprolol succinate  25 mg Oral Daily   potassium chloride  40 mEq Oral Daily   QUEtiapine  25 mg Oral QHS   sertraline  75 mg Oral q AM   [START ON 04/11/2021] torsemide  20 mg Oral q AM   Continuous Infusions:  sodium chloride 50 mL/hr at 04/10/21 1352   ceFEPime (MAXIPIME) IV 2 g (04/10/21 0305)   metronidazole 500 mg (04/10/21 1358)     LOS: 0 days    Time spent: 50 min    Jadrien Narine, MD [facility]   If 7PM-7AM, please contact night-coverage

## 2021-04-11 DIAGNOSIS — Z515 Encounter for palliative care: Secondary | ICD-10-CM

## 2021-04-11 DIAGNOSIS — R531 Weakness: Secondary | ICD-10-CM | POA: Diagnosis not present

## 2021-04-11 DIAGNOSIS — Z7189 Other specified counseling: Secondary | ICD-10-CM

## 2021-04-11 DIAGNOSIS — F03C11 Unspecified dementia, severe, with agitation: Secondary | ICD-10-CM

## 2021-04-11 DIAGNOSIS — Z66 Do not resuscitate: Secondary | ICD-10-CM

## 2021-04-11 DIAGNOSIS — R509 Fever, unspecified: Secondary | ICD-10-CM | POA: Diagnosis not present

## 2021-04-11 NOTE — Progress Notes (Signed)
As patient's 10 year caregiver is present, morning meds were pulled early and caregiver was able to coerce pt into taking them in applesauce. This included the K+. Pt is more awake this morning and talking to caregiver. She has eaten 2 containers of applesauce and is drinking water and OJ.  Will continue to monitor pt.

## 2021-04-11 NOTE — Progress Notes (Signed)
PROGRESS NOTE    Jasmine Jones  KCL:[id number] DOB: [birth year] DOA: 04/09/2021  PCP: [name], NP   Brief Narrative:  This 88 years old female with PMH significant for anxiety, dementia, GERD, A-fib, hypertension presented in the ED with generalized weakness and febrile illness.  History is difficult to obtain from patient as she has significant dementia and caregiver is unclear of what happened.  The caregiver reports that the patient was found to be confused, she seems to move slower than her usual self and getting about her day.  She was also having difficulty walking and  for some reason they decided to check her temperature it was found to be 102.4,  they gave her Tylenol and sent to the ED.  Patient also reported she fell in the bathroom last night and complains of right hip pain She is unable to tell the mechanism of the fall. The caregiver at bedside reported she fell last week and that she is often confused about timelines for certain events.  Patient is admitted for fever of unknown origin.  Assessment & Plan:   Principal Problem:   Fever of unknown origin Active Problems:   Essential hypertension   ATRIAL FIBRILLATION   Dementia (HCC)   Hypokalemia  Fever of unknown origin: Lactic acidosis: Patient presented with fever, no leukocytosis, UA and chest x-ray unremarkable. No fever recorded here.  Influenza negative, COVID-negative Denies any diarrhea or urinary symptoms. We will continue empiric antibiotics(Cipro and Flagyl.). Procalcitonin 0.10, lactic acid normalized with IV hydration. CT A/P:Irregular and consolidative densities in the bilateral lung bases, most likely representing possible pneumonia. Patient remains afebrile.  Hypokalemia: Replaced.  Continue to monitor  Right hip pain status post fall: Continue adequate pain control. Posttraumatic deformity of the proximal right femur status post ORIF for an intertrochanteric fracture.   No acute  fracture or dislocation noted. Continue Tylenol as needed.  Dementia: Continue Namenda and Seroquel.  Elevated troponins: Denies any chest pain, EKG with previously seen RBBB. Troponins are flat 27> 30 Continue telemetry  Essential hypertension: Continue metoprolol.    Paroxysmal A-fib: Continue metoprolol.  Chronic bilateral lower extremity edema: Torsemide 20 mg daily Monitor daily weight, intake output charting.  DVT prophylaxis: Lovenox Code Status: DNR Family Communication: Caregiver at bedside Disposition Plan:   Status is: Inpatient Remains inpatient appropriate because:   Admitted for fever of unknown origin, requiring IV antibiotics and work-up.  Cultures are pending.  PT and OT evaluation.  Consultants:  None Procedures: None.  Antimicrobials:   Anti-infectives (From admission, onward)    Start     Dose/Rate Route Frequency Ordered Stop   04/10/21 0300  ceFEPIme (MAXIPIME) 2 g in sodium chloride 0.9 % 100 mL IVPB        2 g 200 mL/hr over 30 Minutes Intravenous Every 12 hours 04/09/21 1509     04/10/21 0200  metroNIDAZOLE (FLAGYL) IVPB 500 mg        500 mg 100 mL/hr over 60 Minutes Intravenous Every 12 hours 04/09/21 1617 04/17/21 0159   04/09/21 1415  ceFEPIme (MAXIPIME) 2 g in sodium chloride 0.9 % 100 mL IVPB        2 g 200 mL/hr over 30 Minutes Intravenous  Once 04/09/21 1404 04/09/21 1616   04/09/21 1415  metroNIDAZOLE (FLAGYL) IVPB 500 mg        500 mg 100 mL/hr over 60 Minutes Intravenous  Once 04/09/21 1404 04/09/21 1616   04/09/21 1415  vancomycin (VANCOCIN) IVPB 1000  mg/200 mL premix        1,000 mg 200 mL/hr over 60 Minutes Intravenous  Once 04/09/21 1404 04/09/21 1624        Subjective: Patient was seen and examined at bedside.  Overnight events noted. She appears much improved,  awake , oriented  and following full commands. She seems back to her baseline mental status.  Objective: Vitals:   04/10/21 1104 04/10/21 1504  04/10/21 2104 04/11/21 0347  BP: 111/63 137/65 138/72 126/62  Pulse: 74 80 86 81  Resp: 15 17 (!) 24 16  Temp: 97.6 F (36.4 C) 97.6 F (36.4 C) 97.6 F (36.4 C) 99.5 F (37.5 C)  TempSrc: Oral  Oral Oral  SpO2: 98% 96% 96% 99%    Intake/Output Summary (Last 24 hours) at 04/11/2021 1432 Last data filed at 04/11/2021 1355 Gross per 24 hour  Intake 1065.14 ml  Output 2400 ml  Net -1334.86 ml   There were no vitals filed for this visit.  Examination:  General exam: Appears deconditioned, not in any acute distress, comfortable. Respiratory system: Clear to auscultation bilaterally, respiratory effort normal, RR 13 Cardiovascular system: S1 & S2 heard, regular rate and rhythm, no murmur. Gastrointestinal system: Abdomen is soft, non tender, non distended, BS+ Central nervous system: Alert and oriented x 1 . No focal neurological deficits. Extremities: +Edema, no cyanosis, no clubbing. Skin: No rashes, lesions or ulcers Psychiatry: . Mood & affect appropriate.     Data Reviewed: I have personally reviewed following labs and imaging studies  CBC: Recent Labs  Lab 04/09/21 1053 04/10/21 0708  WBC 9.2 8.0  NEUTROABS 7.1  --   HGB 12.9 11.0*  HCT 41.0 35.1*  MCV 81.5 83.0  PLT 191 102   Basic Metabolic Panel: Recent Labs  Lab 04/09/21 1053 04/09/21 1748 04/10/21 0708  NA 140  --  139  K 3.4*  --  3.0*  CL 104  --  105  CO2 24  --  27  GLUCOSE 139*  --  91  BUN 18  --  15  CREATININE 0.79  --  0.59  CALCIUM 9.0  --  8.1*  MG  --  2.0  --    GFR: Estimated Creatinine Clearance: 39.2 mL/min (by C-G formula based on SCr of 0.59 mg/dL). Liver Function Tests: Recent Labs  Lab 04/09/21 1053 04/10/21 0708  AST 26 16  ALT 16 12  ALKPHOS 97 76  BILITOT 0.9 1.2  PROT 6.4* 5.3*  ALBUMIN 3.7 3.1*   Recent Labs  Lab 04/09/21 1053  LIPASE 32   No results for input(s): AMMONIA in the last 168 hours. Coagulation Profile: Recent Labs  Lab 04/09/21 1053  04/10/21 0708  INR 1.0 1.1   Cardiac Enzymes: No results for input(s): CKTOTAL, CKMB, CKMBINDEX, TROPONINI in the last 168 hours. BNP (last 3 results) No results for input(s): PROBNP in the last 8760 hours. HbA1C: No results for input(s): HGBA1C in the last 72 hours. CBG: No results for input(s): GLUCAP in the last 168 hours. Lipid Profile: No results for input(s): CHOL, HDL, LDLCALC, TRIG, CHOLHDL, LDLDIRECT in the last 72 hours. Thyroid Function Tests: No results for input(s): TSH, T4TOTAL, FREET4, T3FREE, THYROIDAB in the last 72 hours. Anemia Panel: No results for input(s): VITAMINB12, FOLATE, FERRITIN, TIBC, IRON, RETICCTPCT in the last 72 hours. Sepsis Labs: Recent Labs  Lab 04/09/21 1053 04/09/21 1358 04/09/21 1432 04/09/21 1748 04/09/21 2358 04/10/21 0708  PROCALCITON  --   --  <0.10  --   --  <  0.10  LATICACIDVEN 2.9* 2.1*  --  1.2 1.2  --     Recent Results (from the past 240 hour(s))  Blood Culture (routine x 2)     Status: None (Preliminary result)   Collection Time: 04/09/21 10:53 AM   Specimen: BLOOD  Result Value Ref Range Status   Specimen Description   Final    BLOOD BLOOD LEFT FOREARM Performed at [facility] [address], [place], [place] [address]    Special Requests   Final    BOTTLES DRAWN AEROBIC AND ANAEROBIC Blood Culture adequate volume Performed at [facility] [address] [place], [place] [address]    Culture   Final    NO GROWTH 2 DAYS Performed at [facility] [address], [place], [place] [address]    Report Status PENDING  Incomplete  Blood Culture (routine x 2)     Status: None (Preliminary result)   Collection Time: 04/09/21 10:55 AM   Specimen: BLOOD  Result Value Ref Range Status   Specimen Description   Final    BLOOD RIGHT ANTECUBITAL Performed at [facility] [address], [place], [place] [address]    Special Requests   Final    BOTTLES DRAWN  AEROBIC AND ANAEROBIC Blood Culture adequate volume Performed at [facility] [address], [place], [place] [address]    Culture   Final    NO GROWTH 2 DAYS Performed at [facility] [address] [place], [place] [address]    Report Status PENDING  Incomplete  Urine Culture     Status: None   Collection Time: 04/09/21  1:58 PM   Specimen: In/Out Cath Urine  Result Value Ref Range Status   Specimen Description   Final    IN/OUT CATH URINE Performed at [facility] [address] [place], [place] [address]    Special Requests   Final    NONE Performed at [facility], [place] [address], [place], [place] [address]    Culture   Final    NO GROWTH Performed at [facility], [place] [address], [place], [place] [address]    Report Status 04/10/2021 FINAL  Final  Resp Panel by RT-PCR (Flu A&B, Covid) Nasopharyngeal Swab     Status: None   Collection Time: 04/09/21  3:22 PM   Specimen: Nasopharyngeal Swab; Nasopharyngeal(NP) swabs in vial transport medium  Result Value Ref Range Status   SARS Coronavirus 2 by RT PCR NEGATIVE NEGATIVE Final    Comment: (NOTE) SARS-CoV-2 target nucleic acids are NOT DETECTED.  The SARS-CoV-2 RNA is generally detectable in upper respiratory specimens during the acute phase of infection. The lowest concentration of SARS-CoV-2 viral copies this assay can detect is 138 copies/mL. A negative result does not preclude SARS-Cov-2 infection and should not be used as the sole basis for treatment or other patient management decisions. A negative result may occur with  improper specimen collection/handling, submission of specimen other than nasopharyngeal swab, presence of viral mutation(s) within the areas targeted by this assay, and inadequate number of viral copies(<138 copies/mL). A negative result must be combined with clinical observations, patient history, and  epidemiological information. The expected result is Negative.  Fact Sheet for Patients:  EntrepreneurPulse.com.au  Fact Sheet for Healthcare Providers:  IncredibleEmployment.be  This test is no t yet approved or cleared by the [place] FDA and  has been authorized for detection  and/or diagnosis of SARS-CoV-2 by FDA under an Emergency Use Authorization (EUA). This EUA will remain  in effect (meaning this test can be used) for the duration of the COVID-19 declaration under Section 564(b)(1) of the Act, 21 U.S.C.section 360bbb-3(b)(1), unless the authorization is terminated  or revoked sooner.       Influenza A by PCR NEGATIVE NEGATIVE Final   Influenza B by PCR NEGATIVE NEGATIVE Final    Comment: (NOTE) The Xpert Xpress SARS-CoV-2/FLU/RSV plus assay is intended as an aid in the diagnosis of influenza from Nasopharyngeal swab specimens and should not be used as a sole basis for treatment. Nasal washings and aspirates are unacceptable for Xpert Xpress SARS-CoV-2/FLU/RSV testing.  Fact Sheet for Patients: EntrepreneurPulse.com.au  Fact Sheet for Healthcare Providers: IncredibleEmployment.be  This test is not yet approved or cleared by the [place] FDA and has been authorized for detection and/or diagnosis of SARS-CoV-2 by FDA under an Emergency Use Authorization (EUA). This EUA will remain in effect (meaning this test can be used) for the duration of the COVID-19 declaration under Section 564(b)(1) of the Act, 21 U.S.C. section 360bbb-3(b)(1), unless the authorization is terminated or revoked.  Performed at [facility], [place] [address], [place], [place] [address]    Radiology Studies: CT Abdomen Pelvis Wo Contrast  Result Date: 04/09/2021 CLINICAL DATA:  Abdominal pain EXAM: CT ABDOMEN AND PELVIS WITHOUT CONTRAST TECHNIQUE: Multidetector CT imaging of the abdomen and pelvis  was performed following the standard protocol without IV contrast. RADIATION DOSE REDUCTION: This exam was performed according to the departmental dose-optimization program which includes automated exposure control, adjustment of the mA and/or kV according to patient size and/or use of iterative reconstruction technique. COMPARISON:  CT abdomen and pelvis 09/25/2017 FINDINGS: Lower chest: Heart is enlarged. Irregular strandy and partially consolidative densities identified in the bilateral lung bases most prominent dependently, most likely representing atelectatic changes. Hepatobiliary: No focal liver abnormality is seen. No gallstones, gallbladder wall thickening, or biliary dilatation. Pancreas: Unremarkable. No pancreatic ductal dilatation or surrounding inflammatory changes. Spleen: Normal in size without focal abnormality. Adrenals/Urinary Tract: Adrenal glands appear within normal limits. Minimal to mild bilateral hydroureteronephrosis, left greater than right. No nephrolithiasis or obstructing calculi identified. The urinary bladder is overly distended with no wall mass visualized. Stomach/Bowel: Small hiatal hernia. No bowel obstruction, free air or pneumatosis. No bowel wall edema identified. Moderate amount of retained fecal material in the colon. Appendix not visualized. Vascular/Lymphatic: Severe atherosclerotic disease. No bulky lymphadenopathy identified. Reproductive: Partially calcified uterine fibroids. No suspicious adnexal mass identified. Other: No ascites. Musculoskeletal: Chronic severe compression fracture deformity of L3 similar to previous study. Severe degenerative changes of the lumbar spine. IMPRESSION: 1. Urinary bladder is significantly distended, correlate clinically. Likely associated minimal to mild bilateral hydroureteronephrosis. 2. Irregular and consolidative densities in the bilateral lung bases, most likely representing atelectatic changes. Correlate for possible pneumonia. 3.  Multiple additional chronic findings as described. Electronically Signed   By: Ofilia Neas M.D.   On: 04/09/2021 15:21   CT Head Wo Contrast  Result Date: 04/09/2021 CLINICAL DATA:  Altered mental status EXAM: CT HEAD WITHOUT CONTRAST TECHNIQUE: Contiguous axial images were obtained from the base of the skull through the vertex without intravenous contrast. RADIATION DOSE REDUCTION: This exam was performed according to the departmental dose-optimization program which includes automated exposure control, adjustment of the mA and/or kV according to patient size and/or use of iterative reconstruction technique. COMPARISON:  CT head 12/27/2018 FINDINGS: Brain: No acute intracranial  hemorrhage, mass effect, or herniation. No extra-axial fluid collections. No evidence of acute territorial infarct. No hydrocephalus. 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 Mild cortical volume loss. Patchy hypodensities in the periventricular and subcortical white matter, likely secondary to chronic microvascular ischemic changes. Vascular: Calcified plaques in the carotid siphons. Skull: Normal. Negative for fracture or focal lesion. Sinuses/Orbits: No acute finding. Other: None. IMPRESSION: Chronic changes with no acute intracranial process identified. Electronically Signed   By: Ofilia Neas M.D.   On: 04/09/2021 15:22   CT Hip Right Wo Contrast  Result Date: 04/09/2021 CLINICAL DATA:  Hip trauma, fracture suspected. Difficulty walking since last night. History of right hip fracture with fixation 12/28/2018. EXAM: CT OF THE RIGHT HIP WITHOUT CONTRAST TECHNIQUE: Multidetector CT imaging of the right hip was performed according to the standard protocol. Multiplanar CT image reconstructions were also generated. RADIATION DOSE REDUCTION: This exam was performed according to the departmental dose-optimization program which includes automated exposure control, adjustment of the mA and/or kV according to patient  size and/or use of iterative reconstruction technique. COMPARISON:  Radiographs 12/27/2018 and 12/28/2018. Pelvic CT today. FINDINGS: Bones/Joint/Cartilage Status post right femoral dynamic screw and intramedullary nail fixation of a comminuted intertrochanteric femur fracture. The distal end of the intramedullary nail is not imaged. The visualized hardware is intact without loosening. Chronic posttraumatic deformity related to the healed intertrochanteric fracture. No evidence of acute fracture or dislocation. There are right hip degenerative changes with a probable ossified loose body anteriorly. Ligaments Suboptimally assessed by CT. Muscles and Tendons Mild fatty atrophy of the gluteus musculature. Otherwise unremarkable. Soft tissues Iliofemoral atherosclerosis. Internal pelvic contents are dictated separately but include calcified uterine fibroids and bladder distension. IMPRESSION: 1. Posttraumatic deformity of the proximal right femur status post ORIF for an intertrochanteric fracture. 2. No evidence of acute fracture or dislocation. 3. No acute soft tissue findings are identified. Electronically Signed   By: Richardean Sale M.D.   On: 04/09/2021 15:21    Scheduled Meds:  busPIRone  10 mg Oral TID   Chlorhexidine Gluconate Cloth  6 each Topical Daily   famotidine  20 mg Oral QHS   memantine  28 mg Oral Daily   metoprolol succinate  25 mg Oral Daily   potassium chloride  40 mEq Oral Daily   QUEtiapine  25 mg Oral QHS   sertraline  75 mg Oral q AM   torsemide  20 mg Oral Daily   Continuous Infusions:  sodium chloride 50 mL/hr at 04/11/21 1330   ceFEPime (MAXIPIME) IV 2 g (04/11/21 0230)   metronidazole 500 mg (04/11/21 1333)     LOS: 1 day    Time spent: 35 min    Ariz Terrones, MD [facility]   If 7PM-7AM, please contact night-coverage

## 2021-04-11 NOTE — Evaluation (Signed)
Physical Therapy Evaluation Patient Details Name: Jasmine Jones MRN: [id number] DOB: 1925/06/29 Today's Date: 04/11/2021  History of Present Illness  This 88 years old female presetned to teh ED with increased confusion, weakness and febrile. Caregiver resports that the patient was found to be confused, she seems to move slower than her usual self and getting about her day.  She was also having difficulty walking and  for some reason they decided to check her temperature it was found to be 102.4.  Patient also reported she fell in the bathroom last night and complains of right hip pain She is unable to tell the mechanism of the fall. The caregiver at bedside reported she fell last week and that she is often confused about timelines for certain events. PMH significant for anxiety, dementia, GERD, A-fib, hypertension   Clinical Impression  Pt with difficulty of hearing during assessment, and attempted movement to sit edge of bed , but pt had a lot of pain in R hip with little movement so was unable to fully sit edge of bed today. May attempt more movement when pt's caregiver is present. Unclear if pt has 24/7 assistance or not, but at this time pt will need it. Will continue to follow while here on acute care.      Recommendations for follow up therapy are one component of a multi-disciplinary discharge planning process, led by the attending physician.  Recommendations may be updated based on patient status, additional functional criteria and insurance authorization.  Follow Up Recommendations Skilled nursing-short term rehab (<3 hours/day) (from ALF, unsure if they have progressive care where she can get more rehab or assistance (?) or if she already has 24/7 caregivers(?))    Assistance Recommended at Discharge Frequent or constant Supervision/Assistance  Patient can return home with the following  A lot of help with walking and/or transfers;A lot of help with  bathing/dressing/bathroom;Assistance with feeding;Direct supervision/assist for medications management    Equipment Recommendations None recommended by PT  Recommendations for Other Services       Functional Status Assessment Patient has had a recent decline in their functional status and demonstrates the ability to make significant improvements in function in a reasonable and predictable amount of time.     Precautions / Restrictions Precautions Precautions: Fall      Mobility  Bed Mobility Overal bed mobility: Needs Assistance Bed Mobility: Supine to Sit, Sit to Supine     Supine to sit: Max assist Sit to supine: Max assist   General bed mobility comments: was unabel to get all the way sitting edge of bed due to pain in r hip with hip flexion, pt resisted and had to MAx A back supine. Pt with very little ability to assist with these movements.    Transfers                        Ambulation/Gait                  Stairs            Wheelchair Mobility    Modified Rankin (Stroke Patients Only)       Balance                                             Pertinent Vitals/Pain Pain Assessment Pain Assessment: Faces Faces Pain  Scale: Hurts whole lot Pain Location: pt reports out anytime I move her R LE with pain. It seemed to improve a little once I did small heel slides in bed AAROM with RLE before trying to move again. However, pt was in seer pain when she tried to sit up edge of bed avoiding R hip flexion in sitting and unable tot sit edge of bed Pain Descriptors / Indicators: Moaning, Sharp Pain Intervention(s): Limited activity within patient's tolerance, Monitored during session, Repositioned    Home Living Family/patient expects to be discharged to:: Assisted living                 Home Equipment: Conservation officer, nature (2 wheels)      Prior Function Prior Level of Function : Needs assist             Mobility  Comments: chart states she does have caregiver and she was walking with asistance short distances with RW ( will need to confirm with the caregiver this admission)       Hand Dominance        Extremity/Trunk Assessment        Lower Extremity Assessment Lower Extremity Assessment: Difficult to assess due to impaired cognition (was able to perfrom supine R and L AAROM for hip flexion and knee flexion but limited in very little range 0-50 knee and about 40 degree hip flexion before pain on R LE.)       Communication   Communication: HOH (right hear is better than the left)  Cognition Arousal/Alertness: Awake/alert Behavior During Therapy: WFL for tasks assessed/performed Overall Cognitive Status: No family/caregiver present to determine baseline cognitive functioning                                 General Comments: pt needed tactile and verbal cues, difficulty with hearing, but able to follow a little with gestures to come sit Edge of bed. at times she would state " I can't hear you".        General Comments      Exercises     Assessment/Plan    PT Assessment Patient needs continued PT services  PT Problem List Decreased strength;Decreased range of motion;Decreased activity tolerance;Decreased mobility;Decreased cognition       PT Treatment Interventions Therapeutic activities;Therapeutic exercise;Functional mobility training;Gait training;Patient/family education    PT Goals (Current goals can be found in the Care Plan section)  Acute Rehab PT Goals PT Goal Formulation: Patient unable to participate in goal setting Time For Goal Achievement: 04/25/21 Potential to Achieve Goals: Fair    Frequency Min 2X/week     Co-evaluation               AM-PAC PT "6 Clicks" Mobility  Outcome Measure Help needed turning from your back to your side while in a flat bed without using bedrails?: A Lot Help needed moving from lying on your back to sitting on  the side of a flat bed without using bedrails?: Total Help needed moving to and from a bed to a chair (including a wheelchair)?: Total Help needed standing up from a chair using your arms (e.g., wheelchair or bedside chair)?: Total Help needed to walk in hospital room?: Total Help needed climbing 3-5 steps with a railing? : Total 6 Click Score: 7    End of Session   Activity Tolerance: Patient limited by pain Patient left: in bed;with bed alarm set;with nursing/sitter  in room Nurse Communication: Mobility status PT Visit Diagnosis: Other abnormalities of gait and mobility (R26.89);Muscle weakness (generalized) (M62.81);Repeated falls (R29.6)    Time: 1530-1600 PT Time Calculation (min) (ACUTE ONLY): 30 min   Charges:   PT Evaluation $PT Eval Low Complexity: 1 Low PT Treatments $Therapeutic Activity: 8-22 mins        Gearldine Looney, PT, MPT Acute Rehabilitation Services Office: [phone] Pager: [phone] 04/11/2021   Clide Dales 04/11/2021, 4:50 PM

## 2021-04-11 NOTE — Consult Note (Signed)
Consultation Note Date: 04/11/2021   Patient Name: Jasmine Jones  DOB: 11/22/1925  MRN: [id number]  Age / Sex: 88 y.o., female  PCP: [name], NP Referring Physician: Shawna Clamp, MD  Reason for Consultation: Establishing goals of care "dementia affecting diet"  HPI/Patient Profile: 88 y.o. female  with past medical history of dementia, a-fib, GERD, anxiety, and HTN. She presented to the emergency department on 04/09/2021 with altered mental status and weakness. Caregiver reported that she fell in the bathroom last night and complained of right hip pain. Temperature of 102 on EMS arrival. Admitted to [facility] with fever of unknown origin and fall.   Patient has been afebrile in the hospital. Urine culture negative. Chest x-ray unremarkable, but CT abdomen/pelvis showed irregular and consolidative densities in the bilateral lung bases, atelectasis versus possible pneumonia.   Clinical Assessment and Goals of Care: I have reviewed medical records including EPIC notes, labs and imaging, and examined the patient at bedside. She is oriented to self only.   I spoke with her nieces Corwin Levins and Thurnell Garbe by phone to discuss diagnosis, prognosis, [place], EOL wishes, disposition, and options. [name] lives in [place]) and [place] lives in [place], [place] as specialized medical care for people living with serious illness. It focuses on providing relief from the symptoms and stress of a serious illness.   We discussed a brief life review of the patient. She has lived in [place] her entire life. She is a retired Therapist, sports. She is a widow; her husband passed 20+ years ago. They did not have children. [name] and [name] are the daughters of patient's sister, and are her next of kin.   Patient has lived at [facility] since Fall of 2019. She has had a dedicated  caregiver/[name] since 2015. As far as functional and nutritional status, family and caregiver have reported a decline over the past few weeks. At baseline, she is ambulatory with a walker but dependent for all ADLs.   We discussed her current illness and what it means in the larger context of her ongoing co-morbidities.  Natural disease trajectory of dementia was discussed. We reviewed that dementia is a progressive, non-curable disease underlying the patient's current acute medical conditions. We reviewed that as it progresses, dementia results in difficulty communicating, bed bound/non-ambulatory status, decreased oral intake, an incontinence of bowel/bladder. I expressed concern that Mrs. Fulco's recent decline is likely due to progressive dementia.   The difference between full scope medical intervention and comfort care was considered.  I introduced the concept of a comfort path to family, emphasizing that this path involves de-escalating and stopping full scope medical interventions, allowing a natural course to occur. Discussed that the goal is comfort and dignity rather than cure/prolonging life.   [name] and [name] clearly favor a comfort based approach to care for Mrs. Bolender. They feel that her quality of life has been diminishing. They don't want her to suffer or have her life prolonged in a  state of decline. They report she misses her husband and has been "ready" to reunite with him.   Reviewed hospice philosophy and provided information at length on home vs residential hospice services. Discussed that Mrs. Poehlman may not be eligible for residential hospice yet. Discussed that her prognosis is significantly affected by her oral intake. [name] and [name] are agreeable to proceed with a hospice referral, and state preference for [facility] [place].  If patient is not eligible for residential hospice, they are open to return to [facility] if they can provide the necessary level of  care. This would be with the addition of hospice support and patient would also have her caregiver/[name] who is with her at least 40 hours per week.   Questions and concerns were addressed.  The family was encouraged to call with questions or concerns.    Primary decision maker: Nieces Corwin Levins and Thurnell Garbe    SUMMARY OF RECOMMENDATIONS   DNR/DNI Continue current supportive care Referral to [place] [facility] will continue to follow  Code Status/Advance Care Planning: DNR  Symptom Management:  Agree with ativan 0.5 mg every 6 hours as needed  Additional Recommendations (Limitations, Scope, Preferences): No Artificial Feeding  Prognosis:  Less than 6 months  Discharge Planning: To Be Determined      Primary Diagnoses: Present on Admission:  Fever of unknown origin  Essential hypertension  ATRIAL FIBRILLATION  Dementia ([place])   I have reviewed the medical record, interviewed the patient and family, and examined the patient. The following aspects are pertinent.  Past Medical History:  Diagnosis Date   A-fib ([place])    Anemia    Anxiety    Dry skin    on ears   Gastroesophageal reflux    Hearing loss    significant  uses hearing aids   High blood pressure    Hyperlipidemia    Loss of appetite    Loss of smell    Macular degeneration    Malnutrition (HCC)    Osteoporosis    Persistent dry cough    Skin cancer    basal / squamous   Slow transit constipation    abstracted from new patient packet    Vitamin D deficiency 01/26/2018     Family History  Problem Relation Age of Onset   Atrial fibrillation Sister    Diabetes Sister    Osteoarthritis Sister    Osteopenia Sister    Mental retardation Sister    Scheduled Meds:  busPIRone  10 mg Oral TID   Chlorhexidine Gluconate Cloth  6 each Topical Daily   famotidine  20 mg Oral QHS   memantine  28 mg Oral Daily   metoprolol succinate  25 mg Oral Daily   potassium chloride  40 mEq Oral  Daily   QUEtiapine  25 mg Oral QHS   sertraline  75 mg Oral q AM   torsemide  20 mg Oral Daily   Continuous Infusions:  sodium chloride 50 mL/hr at 04/11/21 1330   ceFEPime (MAXIPIME) IV 2 g (04/11/21 1508)   metronidazole 500 mg (04/11/21 1333)   PRN Meds:.acetaminophen **OR** acetaminophen, docusate sodium, HYDROcodone-acetaminophen, LORazepam, ondansetron **OR** ondansetron (ZOFRAN) IV   No Known Allergies Review of Systems  Unable to perform ROS: Dementia   Physical Exam Vitals reviewed.  Constitutional:      General: She is not in acute distress.    Comments: Frail and chronically ill-appearing  Pulmonary:     Effort:  Pulmonary effort is normal.  Neurological:     Mental Status: She is alert.     Comments: Oriented to self only  Psychiatric:        Cognition and Memory: Cognition is impaired. Memory is impaired.    Vital Signs: BP 117/66 (BP Location: Left Arm)    Pulse 68    Temp 98.6 F (37 C)    Resp 17    SpO2 95%  Pain Scale: Faces   Pain Score: Asleep   SpO2: SpO2: 95 % O2 Device:SpO2: 95 % O2 Flow Rate: .O2 Flow Rate (L/min): 2 L/min        Palliative Assessment/Data: PPS 20-30%    Time Total: 90 minutes  Greater than 50%  of this time was spent counseling and coordinating care related to the above assessment and plan.  Signed by: Lavena Bullion, NP   Please contact Palliative Medicine Team phone at [phone] for questions and concerns.  For individual provider: See Shea Evans

## 2021-04-12 DIAGNOSIS — R509 Fever, unspecified: Secondary | ICD-10-CM | POA: Diagnosis not present

## 2021-04-12 LAB — BASIC METABOLIC PANEL
Anion gap: 8 (ref 5–15)
BUN: 14 mg/dL (ref 8–23)
CO2: 25 mmol/L (ref 22–32)
Calcium: 7.8 mg/dL — ABNORMAL LOW (ref 8.9–10.3)
Chloride: 104 mmol/L (ref 98–111)
Creatinine, Ser: 0.65 mg/dL (ref 0.44–1.00)
GFR, Estimated: >60 mL/min (ref >60)
Glucose, Bld: 137 mg/dL — ABNORMAL HIGH (ref 70–99)
Potassium: 3.3 mmol/L — ABNORMAL LOW (ref 3.5–5.1)
Sodium: 137 mmol/L (ref 135–145)

## 2021-04-12 MED ORDER — HALOPERIDOL LACTATE 5 MG/ML IJ SOLN
5.0000 mg | Freq: Four times a day (QID) | INTRAMUSCULAR | Status: DC | PRN
  Administered 2021-04-12 – 2021-04-14 (×3): 5 mg via INTRAVENOUS
  Filled 2021-04-12 (×4): qty 1

## 2021-04-12 NOTE — TOC Initial Note (Signed)
Transition of Care [facility]) - Initial/Assessment Note    Patient Details  Name: Jasmine Jones MRN: [id number] Date of Birth: [birth year]  Transition of Care [facility]) CM/SW Contact:    Trish Mage, LCSW Phone Number: 04/12/2021, 9:30 AM  Clinical Narrative:    CSW responding to palliative consult for referral to [facility].  Spoke with Cristela Blue who took referral, willlet [place] know after medical review of findings and recommendations. TOC will continue to follow during the course of hospitalization.                Expected Discharge Plan:  (To be determined) Barriers to Discharge: Continued Medical Work up   Patient Goals and CMS Choice        Expected Discharge Plan and Services Expected Discharge Plan:  (To be determined)                                              Prior Living Arrangements/Services                       Activities of Daily Living   ADL Screening (condition at time of admission) Patient's cognitive ability adequate to safely complete daily activities?: Yes Is the patient deaf or have difficulty hearing?: Yes Does the patient have difficulty concentrating, remembering, or making decisions?: Yes  Permission Sought/Granted                  Emotional Assessment              Admission diagnosis:  Fever of unknown origin [R50.9] Generalized weakness [R53.1] Sepsis, due to unspecified organism, unspecified whether acute organ dysfunction present [facility]) [A41.9] Patient Active Problem List   Diagnosis Date Noted   Fever of unknown origin 04/09/2021   Hypokalemia 04/09/2021   Aortic atherosclerosis ([place]) 09/30/2020   Closed right hip fracture, initial encounter ([place]) 12/27/2018   Depression with anxiety 12/27/2018   Dementia ([place]) 12/27/2018   GOITER, MULTINODULAR 08/15/2007   CHEST PAIN 08/15/2007   HYPERLIPIDEMIA 08/14/2007   INSOMNIA, CHRONIC 08/14/2007   Essential hypertension 08/14/2007   ATRIAL  FIBRILLATION 08/14/2007   Osteoarthritis 08/14/2007   Osteoporosis 08/14/2007   PCP:  [name], NP Pharmacy:   [facility] [address] [place], [place] - [address] [place] [address] Phone: [phone] Fax: [phone]     Social Determinants of Health (SDOH) Interventions    Readmission Risk Interventions No flowsheet data found.

## 2021-04-12 NOTE — Progress Notes (Signed)
SLP Cancellation Note  Patient Details Name: Jasmine Jones MRN: [id number] DOB: [birth year]   Cancelled treatment:       Reason Eval/Treat Not Completed: Other (comment) (per nursing, patient more agitated. In addition, hospice RN in room talking with patient's caregiver. SLP will attempt to f/u next 1- 2 dates.)  Sonia Baller, MA, CCC-SLP Speech Therapy

## 2021-04-12 NOTE — Progress Notes (Signed)
PROGRESS NOTE    Jasmine Jones  LPF:[id number] DOB: 02-08-1926 DOA: 04/09/2021  PCP: [name], NP   Brief Narrative:  This 88 years old female with PMH significant for anxiety, dementia, GERD, A-fib, hypertension presented in the ED with generalized weakness and febrile illness.  History is difficult to obtain from patient as she has significant dementia and caregiver is unclear of what happened.  The caregiver reports that the patient was found to be confused, she seems to move slower than her usual self and getting about her day.  She was also having difficulty walking and  for some reason they decided to check her temperature it was found to be 102.4,  they gave her Tylenol and sent to the ED.  Patient also reported she fell in the bathroom last night and complains of right hip pain She is unable to tell the mechanism of the fall. The caregiver at bedside reported she fell last week and that she is often confused about timelines for certain events.  Patient is admitted for fever of unknown origin.  So far remains afebrile, lactic acidosis resolved.  PT recommended SNF.  Awaiting authorization.  Assessment & Plan:   Principal Problem:   Fever of unknown origin Active Problems:   Essential hypertension   ATRIAL FIBRILLATION   Dementia (HCC)   Hypokalemia  Fever of unknown origin: Lactic acidosis: Patient presented with fever, no leukocytosis, UA and chest x-ray unremarkable. No fever recorded here.  Influenza negative, COVID-negative Denies any diarrhea or urinary symptoms. We will continue empiric antibiotics(Cipro and Flagyl.). Procalcitonin 0.10, lactic acid normalized with IV hydration. CT A/P: Irregular and consolidative densities in the bilateral lung bases, most likely representing possible pneumonia. Patient remains afebrile. Sepsis ruled out.  Hypokalemia: Replaced.  Continue to monitor  Right hip pain status post fall: Continue adequate pain  control. Posttraumatic deformity of the proximal right femur status post ORIF for an intertrochanteric fracture.   No acute fracture or dislocation noted. Continue Tylenol as needed.  Dementia: Continue Namenda and Seroquel.  Elevated troponins: Denies any chest pain, EKG with previously seen RBBB. Troponins are flat 27> 30 Continue telemetry  Essential hypertension: Continue metoprolol.    Paroxysmal A-fib: Continue metoprolol.  Chronic bilateral lower extremity edema: Torsemide 20 mg daily Monitor daily weight, intake output charting.  DVT prophylaxis: Lovenox Code Status: DNR Family Communication: Caregiver at bedside Disposition Plan:   Status is: Inpatient Remains inpatient appropriate because:   Admitted for fever of unknown origin, requiring IV antibiotics and work-up.  Cultures are pending.  PT and OT evaluation recommended  SNF  Consultants:  None Procedures: None.  Antimicrobials:   Anti-infectives (From admission, onward)    Start     Dose/Rate Route Frequency Ordered Stop   04/10/21 0300  ceFEPIme (MAXIPIME) 2 g in sodium chloride 0.9 % 100 mL IVPB        2 g 200 mL/hr over 30 Minutes Intravenous Every 12 hours 04/09/21 1509     04/10/21 0200  metroNIDAZOLE (FLAGYL) IVPB 500 mg        500 mg 100 mL/hr over 60 Minutes Intravenous Every 12 hours 04/09/21 1617 04/17/21 0159   04/09/21 1415  ceFEPIme (MAXIPIME) 2 g in sodium chloride 0.9 % 100 mL IVPB        2 g 200 mL/hr over 30 Minutes Intravenous  Once 04/09/21 1404 04/09/21 1616   04/09/21 1415  metroNIDAZOLE (FLAGYL) IVPB 500 mg        500  mg 100 mL/hr over 60 Minutes Intravenous  Once 04/09/21 1404 04/09/21 1616   04/09/21 1415  vancomycin (VANCOCIN) IVPB 1000 mg/200 mL premix        1,000 mg 200 mL/hr over 60 Minutes Intravenous  Once 04/09/21 1404 04/09/21 1624        Subjective: Patient was seen and examined at bedside.  Overnight events noted. She appears much improved, awake and  alert, following commands. She seems back to her baseline mental status.  Objective: Vitals:   04/11/21 2054 04/12/21 0200 04/12/21 0642 04/12/21 1154  BP: 132/63  136/73 128/68  Pulse: 70  65 70  Resp: (!) 24 (!) 24 14   Temp: (!) 97.5 F (36.4 C)  97.6 F (36.4 C)   TempSrc: Oral  Oral   SpO2: 94%  94%     Intake/Output Summary (Last 24 hours) at 04/12/2021 1418 Last data filed at 04/12/2021 1610 Gross per 24 hour  Intake 1260 ml  Output 1350 ml  Net -90 ml   There were no vitals filed for this visit.  Examination:  General exam: Appears comfortable, deconditioned, not in any distress. Respiratory system: Clear to auscultation bilaterally, respiratory effort normal, RR 14 Cardiovascular system: S1 & S2 heard, regular rate and rhythm, no murmur. Gastrointestinal system: Abdomen is soft, non tender, non distended, BS+ Central nervous system: Alert and oriented x 1 . No focal neurological deficits. Extremities: +Edema, no cyanosis, no clubbing. Skin: No rashes, lesions or ulcers Psychiatry: . Mood & affect appropriate.     Data Reviewed: I have personally reviewed following labs and imaging studies  CBC: Recent Labs  Lab 04/09/21 1053 04/10/21 0708  WBC 9.2 8.0  NEUTROABS 7.1  --   HGB 12.9 11.0*  HCT 41.0 35.1*  MCV 81.5 83.0  PLT 191 960   Basic Metabolic Panel: Recent Labs  Lab 04/09/21 1053 04/09/21 1748 04/10/21 0708 04/12/21 1018  NA 140  --  139 137  K 3.4*  --  3.0* 3.3*  CL 104  --  105 104  CO2 24  --  27 25  GLUCOSE 139*  --  91 137*  BUN 18  --  15 14  CREATININE 0.79  --  0.59 0.65  CALCIUM 9.0  --  8.1* 7.8*  MG  --  2.0  --   --    GFR: Estimated Creatinine Clearance: 39.2 mL/min (by C-G formula based on SCr of 0.65 mg/dL). Liver Function Tests: Recent Labs  Lab 04/09/21 1053 04/10/21 0708  AST 26 16  ALT 16 12  ALKPHOS 97 76  BILITOT 0.9 1.2  PROT 6.4* 5.3*  ALBUMIN 3.7 3.1*   Recent Labs  Lab 04/09/21 1053  LIPASE 32    No results for input(s): AMMONIA in the last 168 hours. Coagulation Profile: Recent Labs  Lab 04/09/21 1053 04/10/21 0708  INR 1.0 1.1   Cardiac Enzymes: No results for input(s): CKTOTAL, CKMB, CKMBINDEX, TROPONINI in the last 168 hours. BNP (last 3 results) No results for input(s): PROBNP in the last 8760 hours. HbA1C: No results for input(s): HGBA1C in the last 72 hours. CBG: No results for input(s): GLUCAP in the last 168 hours. Lipid Profile: No results for input(s): CHOL, HDL, LDLCALC, TRIG, CHOLHDL, LDLDIRECT in the last 72 hours. Thyroid Function Tests: No results for input(s): TSH, T4TOTAL, FREET4, T3FREE, THYROIDAB in the last 72 hours. Anemia Panel: No results for input(s): VITAMINB12, FOLATE, FERRITIN, TIBC, IRON, RETICCTPCT in the last 72 hours. Sepsis Labs:  Recent Labs  Lab 04/09/21 1053 04/09/21 1358 04/09/21 1432 04/09/21 1748 04/09/21 2358 04/10/21 0708  PROCALCITON  --   --  <0.10  --   --  <0.10  LATICACIDVEN 2.9* 2.1*  --  1.2 1.2  --     Recent Results (from the past 240 hour(s))  Blood Culture (routine x 2)     Status: None (Preliminary result)   Collection Time: 04/09/21 10:53 AM   Specimen: BLOOD  Result Value Ref Range Status   Specimen Description   Final    BLOOD BLOOD LEFT FOREARM Performed at [facility], [place] [address] [place], [place] [address]    Special Requests   Final    BOTTLES DRAWN AEROBIC AND ANAEROBIC Blood Culture adequate volume Performed at [facility] [address], [place], [place] [address]    Culture   Final    NO GROWTH 3 DAYS Performed at [facility], [place] [address], [place], [place] [address]    Report Status PENDING  Incomplete  Blood Culture (routine x 2)     Status: None (Preliminary result)   Collection Time: 04/09/21 10:55 AM   Specimen: BLOOD  Result Value Ref Range Status   Specimen Description   Final    BLOOD RIGHT ANTECUBITAL Performed at [facility] [address] [place], [place] [address]    Special Requests   Final    BOTTLES DRAWN AEROBIC AND ANAEROBIC Blood Culture adequate volume Performed at [facility] [address] [place], [place] [address]    Culture   Final    NO GROWTH 3 DAYS Performed at [facility], [place] [address], [place], [place] [address]    Report Status PENDING  Incomplete  Urine Culture     Status: None   Collection Time: 04/09/21  1:58 PM   Specimen: In/Out Cath Urine  Result Value Ref Range Status   Specimen Description   Final    IN/OUT CATH URINE Performed at [facility] [address], [place], [place] [address]    Special Requests   Final    NONE Performed at [facility], [place] [address], [place],  [address]    Culture   Final    NO GROWTH Performed at [facility], [place] [address], [place],  [address]    Report Status 04/10/2021 FINAL  Final  Resp Panel by RT-PCR (Flu A&B, Covid) Nasopharyngeal Swab     Status: None   Collection Time: 04/09/21  3:22 PM   Specimen: Nasopharyngeal Swab; Nasopharyngeal(NP) swabs in vial transport medium  Result Value Ref Range Status   SARS Coronavirus 2 by RT PCR NEGATIVE NEGATIVE Final    Comment: (NOTE) SARS-CoV-2 target nucleic acids are NOT DETECTED.  The SARS-CoV-2 RNA is generally detectable in upper respiratory specimens during the acute phase of infection. The lowest concentration of SARS-CoV-2 viral copies this assay can detect is 138 copies/mL. A negative result does not preclude SARS-Cov-2 infection and should not be used as the sole basis for treatment or other patient management decisions. A negative result may occur with  improper specimen collection/handling, submission of specimen other than nasopharyngeal swab, presence of viral mutation(s) within the areas targeted by this assay, and inadequate number of viral copies(<138  copies/mL). A negative result must be combined with clinical observations, patient history, and epidemiological information. The expected result is Negative.  Fact Sheet for Patients:  EntrepreneurPulse.com.au  Fact Sheet for Healthcare Providers:  IncredibleEmployment.be  This test is no t yet approved or cleared by the [place] FDA and  has been authorized for detection and/or diagnosis of SARS-CoV-2 by FDA under an Emergency Use Authorization (EUA). This EUA will remain  in effect (meaning this test can be used) for the duration of the COVID-19 declaration under Section 564(b)(1) of the Act, 21 U.S.C.section 360bbb-3(b)(1), unless the authorization is terminated  or revoked sooner.       Influenza A by PCR NEGATIVE NEGATIVE Final   Influenza B by PCR NEGATIVE NEGATIVE Final    Comment: (NOTE) The Xpert Xpress SARS-CoV-2/FLU/RSV plus assay is intended as an aid in the diagnosis of influenza from Nasopharyngeal swab specimens and should not be used as a sole basis for treatment. Nasal washings and aspirates are unacceptable for Xpert Xpress SARS-CoV-2/FLU/RSV testing.  Fact Sheet for Patients: EntrepreneurPulse.com.au  Fact Sheet for Healthcare Providers: IncredibleEmployment.be  This test is not yet approved or cleared by the [place] FDA and has been authorized for detection and/or diagnosis of SARS-CoV-2 by FDA under an Emergency Use Authorization (EUA). This EUA will remain in effect (meaning this test can be used) for the duration of the COVID-19 declaration under Section 564(b)(1) of the Act, 21 U.S.C. section 360bbb-3(b)(1), unless the authorization is terminated or revoked.  Performed at [facility], [place] [address], [place], [place] [address]    Radiology Studies: No results found.  Scheduled Meds:  busPIRone  10 mg Oral TID   Chlorhexidine Gluconate Cloth   6 each Topical Daily   famotidine  20 mg Oral QHS   memantine  28 mg Oral Daily   metoprolol succinate  25 mg Oral Daily   potassium chloride  40 mEq Oral Daily   QUEtiapine  25 mg Oral QHS   sertraline  75 mg Oral q AM   torsemide  20 mg Oral Daily   Continuous Infusions:  sodium chloride 50 mL/hr at 04/11/21 1330   ceFEPime (MAXIPIME) IV Stopped (04/12/21 0248)   metronidazole 500 mg (04/12/21 1339)     LOS: 2 days    Time spent: 35 min    Clanton Emanuelson, MD [facility]   If 7PM-7AM, please contact night-coverage

## 2021-04-12 NOTE — Progress Notes (Signed)
Physical Therapy Treatment Patient Details Name: Jasmine Jones MRN: [id number] DOB: 1925/09/15 Today's Date: 04/12/2021   History of Present Illness This 88 years old female presetned to teh ED with increased confusion, weakness and febrile. Caregiver resports that the patient was found to be confused, she seems to move slower than her usual self and getting about her day.  She was also having difficulty walking and  for some reason they decided to check her temperature it was found to be 102.4.  Patient also reported she fell in the bathroom last night and complains of right hip pain She is unable to tell the mechanism of the fall. The caregiver at bedside reported she fell last week and that she is often confused about timelines for certain events. PMH significant for anxiety, dementia, GERD, A-fib, hypertension    PT Comments    Patient limited by generalized weakness and impaired cognition. She required Max assist and cues to initiate and sequence mobility to EOB. Pt requires 2 person assist to attempt stand at bedside and was able to stand for ~10 seconds before returning to sit. Pt agitated at times regarding mobility and clenching hand in frustration. Max +2 to reposition in bed at EOS and pt placed in chair position and more alert after mobilizing. Acute PT will continue to progress as able. Recommend pt return to ALF with 24/7 assist and therapy services vs SNF placement for ST rehab.     Recommendations for follow up therapy are one component of a multi-disciplinary discharge planning process, led by the attending physician.  Recommendations may be updated based on patient status, additional functional criteria and insurance authorization.  Follow Up Recommendations  Skilled nursing-short term rehab (<3 hours/day) (return to ALF with 24/7 caregiver assist)     Assistance Recommended at Discharge Frequent or constant Supervision/Assistance  Patient can return home with the  following Two people to help with walking and/or transfers;A little help with bathing/dressing/bathroom;Assistance with cooking/housework;Assistance with feeding;Direct supervision/assist for medications management;Assist for transportation;Help with stairs or ramp for entrance   Equipment Recommendations  None recommended by PT    Recommendations for Other Services       Precautions / Restrictions Precautions Precautions: Fall Restrictions Weight Bearing Restrictions: No     Mobility  Bed Mobility Overal bed mobility: Needs Assistance Bed Mobility: Supine to Sit, Sit to Supine     Supine to sit: Max assist, HOB elevated Sit to supine: Max assist, +2 for physical assistance, +2 for safety/equipment   General bed mobility comments: Max assist with significant extra time to process cues. pt required max assist to bring LE's off EOB and to raise trunk upright. 2+ max to return to supine and boost up in bed.    Transfers Overall transfer level: Needs assistance Equipment used: 2 person hand held assist Transfers: Sit to/from Stand Sit to Stand: Max assist, +2 safety/equipment, +2 physical assistance           General transfer comment: Max +2 HHA for power up from EOB. pt unable to complete rise fully and therapist blocking pt's LE's at feet to prevent anterior slide with stand. Pt stood for ~10 seconds before returning to EOB.    Ambulation/Gait                   Stairs             Wheelchair Mobility    Modified Rankin (Stroke Patients Only)       Balance Overall  balance assessment: Needs assistance Sitting-balance support: Feet supported Sitting balance-Leahy Scale: Poor                                      Cognition Arousal/Alertness: Awake/alert Behavior During Therapy: Agitated Overall Cognitive Status: History of cognitive impairments - at baseline                                 General Comments: pt's  caregive of 10 years at bedside. pt with decline in cognition and more agitated over last few months per caregiver report.        Exercises      General Comments        Pertinent Vitals/Pain Pain Assessment Breathing: normal Negative Vocalization: occasional moan/groan, low speech, negative/disapproving quality Facial Expression: sad, frightened, frown Body Language: tense, distressed pacing, fidgeting Consolability: no need to console PAINAD Score: 3 Pain Intervention(s): Limited activity within patient's tolerance, Monitored during session, Repositioned    Home Living                          Prior Function            PT Goals (current goals can now be found in the care plan section) Acute Rehab PT Goals PT Goal Formulation: Patient unable to participate in goal setting Time For Goal Achievement: 04/25/21 Potential to Achieve Goals: Fair Progress towards PT goals: Progressing toward goals    Frequency    Min 2X/week      PT Plan Current plan remains appropriate    Co-evaluation              AM-PAC PT "6 Clicks" Mobility   Outcome Measure  Help needed turning from your back to your side while in a flat bed without using bedrails?: A Lot Help needed moving from lying on your back to sitting on the side of a flat bed without using bedrails?: Total Help needed moving to and from a bed to a chair (including a wheelchair)?: Total Help needed standing up from a chair using your arms (e.g., wheelchair or bedside chair)?: Total Help needed to walk in hospital room?: Total Help needed climbing 3-5 steps with a railing? : Total 6 Click Score: 7    End of Session Equipment Utilized During Treatment: Gait belt Activity Tolerance: Patient limited by pain Patient left: in bed;with bed alarm set;with family/visitor present;with call bell/phone within reach Nurse Communication: Mobility status PT Visit Diagnosis: Other abnormalities of gait and mobility  (R26.89);Muscle weakness (generalized) (M62.81);Repeated falls (R29.6)     Time: 7416-3845 PT Time Calculation (min) (ACUTE ONLY): 22 min  Charges:  $Therapeutic Activity: 8-22 mins                     Verner Mould, DPT Acute Rehabilitation Services Office [phone] Pager [phone]    Jasmine Jones 04/12/2021, 11:28 AM

## 2021-04-12 NOTE — Progress Notes (Signed)
Pt bed alarm alerted staff  at 2140 , pt tring to get out of bed , pulling on Rt side of bed bedrails. It was discovered pt pulled out her foley catheter with balloon inflated. Opted not to reinsert at this time, will try the purewick and hand mits  At this time pt refused oral intake and oral meds

## 2021-04-13 ENCOUNTER — Telehealth: Payer: Medicare Other | Admitting: Nurse Practitioner

## 2021-04-13 DIAGNOSIS — R509 Fever, unspecified: Secondary | ICD-10-CM | POA: Diagnosis not present

## 2021-04-13 LAB — BASIC METABOLIC PANEL
Anion gap: 7 (ref 5–15)
BUN: 12 mg/dL (ref 8–23)
CO2: 27 mmol/L (ref 22–32)
Calcium: 7.8 mg/dL — ABNORMAL LOW (ref 8.9–10.3)
Chloride: 105 mmol/L (ref 98–111)
Creatinine, Ser: 0.57 mg/dL (ref 0.44–1.00)
GFR, Estimated: >60 mL/min (ref >60)
Glucose, Bld: 97 mg/dL (ref 70–99)
Potassium: 3.1 mmol/L — ABNORMAL LOW (ref 3.5–5.1)
Sodium: 139 mmol/L (ref 135–145)

## 2021-04-13 LAB — CBC
HCT: 34.3 % — ABNORMAL LOW (ref 36.0–46.0)
Hemoglobin: 10.9 g/dL — ABNORMAL LOW (ref 12.0–15.0)
MCH: 26.2 pg (ref 26.0–34.0)
MCHC: 31.8 g/dL (ref 30.0–36.0)
MCV: 82.5 fL (ref 80.0–100.0)
Platelets: 141 10*3/uL — ABNORMAL LOW (ref 150–400)
RBC: 4.16 MIL/uL (ref 3.87–5.11)
RDW: 14.8 % (ref 11.5–15.5)
WBC: 7.9 10*3/uL (ref 4.0–10.5)
nRBC: 0 % (ref 0.0–0.2)

## 2021-04-13 MED ORDER — POTASSIUM CHLORIDE CRYS ER 20 MEQ PO TBCR
40.0000 meq | EXTENDED_RELEASE_TABLET | Freq: Once | ORAL | Status: AC
  Administered 2021-04-13: 40 meq via ORAL
  Filled 2021-04-13: qty 2

## 2021-04-13 NOTE — Progress Notes (Signed)
PROGRESS NOTE    Arsema Tusing Seehafer  IPJ:[id number] DOB: Jul 06, 1925 DOA: 04/09/2021  PCP: [name], NP   Brief Narrative:  This 88 years old female with PMH significant for anxiety, dementia, GERD, A-fib, hypertension presented in the ED with generalized weakness and febrile illness.  History is difficult to obtain from patient as she has significant dementia and caregiver is unclear of what happened.  The caregiver reports that the patient was found to be confused, she seems to move slower than her usual self and getting about her day.  She was also having difficulty walking and for some reason they decided to check her temperature it was found to be 102.4,  they gave her Tylenol and sent to the ED.  Patient also reported she fell in the bathroom last night and complains of right hip pain.  She is unable to tell the mechanism of the fall. The caregiver at bedside reported she fell last week and that she is often confused about timelines for certain events.  Patient is admitted for fever of unknown origin.  So far remains afebrile, lactic acidosis resolved.  PT recommended SNF.  Awaiting authorization.  Assessment & Plan:   Principal Problem:   Fever of unknown origin Active Problems:   Essential hypertension   ATRIAL FIBRILLATION   Dementia (HCC)   Hypokalemia  Fever of unknown origin: Lactic acidosis: Patient presented with fever, no leukocytosis, UA and chest x-ray unremarkable. No fever recorded here.  Influenza negative, COVID-negative Denies any diarrhea or urinary symptoms. Initiated empiric antibiotics (Cipro and Flagyl.). Procalcitonin 0.10, lactic acid normalized with IV hydration. CT A/P: Irregular and consolidative densities in the bilateral lung bases, most likely representing possible pneumonia. Patient remains afebrile. Sepsis ruled out. Flagyl discontinued, continued cefepime.  last dose tomorrow 04/14/21. Blood cultures no growth so  far.  Hypokalemia: Replaced.  Continue to monitor  Right hip pain status post fall: Continue adequate pain control. Posttraumatic deformity of the proximal right femur status post ORIF for an intertrochanteric fracture.   No acute fracture or dislocation noted. Continue Tylenol as needed.  Dementia: Continue Namenda and Seroquel.  Elevated troponins: Denies any chest pain, EKG with previously seen RBBB. Troponins are flat 27> 30 Continue telemetry  Essential hypertension: Continue metoprolol.    Paroxysmal A-fib: Continue metoprolol.  Chronic bilateral lower extremity edema: Torsemide 20 mg daily Monitor daily weight, intake output charting.  DVT prophylaxis: Lovenox Code Status: DNR Family Communication: Caregiver at bedside Disposition Plan:   Status is: Inpatient Remains inpatient appropriate because:   Admitted for fever of unknown origin, requiring IV antibiotics and work-up.  Blood cultures negative. Completed antibiotic course.  PT and OT evaluation recommended  SNF  Consultants:  None Procedures: None.  Antimicrobials:   Anti-infectives (From admission, onward)    Start     Dose/Rate Route Frequency Ordered Stop   04/10/21 0300  ceFEPIme (MAXIPIME) 2 g in sodium chloride 0.9 % 100 mL IVPB        2 g 200 mL/hr over 30 Minutes Intravenous Every 12 hours 04/09/21 1509 04/14/21 1459   04/10/21 0200  metroNIDAZOLE (FLAGYL) IVPB 500 mg  Status:  Discontinued        500 mg 100 mL/hr over 60 Minutes Intravenous Every 12 hours 04/09/21 1617 04/13/21 1112   04/09/21 1415  ceFEPIme (MAXIPIME) 2 g in sodium chloride 0.9 % 100 mL IVPB        2 g 200 mL/hr over 30 Minutes Intravenous  Once 04/09/21  1404 04/09/21 1616   04/09/21 1415  metroNIDAZOLE (FLAGYL) IVPB 500 mg        500 mg 100 mL/hr over 60 Minutes Intravenous  Once 04/09/21 1404 04/09/21 1616   04/09/21 1415  vancomycin (VANCOCIN) IVPB 1000 mg/200 mL premix        1,000 mg 200 mL/hr over 60 Minutes  Intravenous  Once 04/09/21 1404 04/09/21 1624        Subjective: Patient was seen and examined at bedside.  Overnight events noted. She appears much improved.  Hard of hearing but following commands. She seems back to her baseline mental status.  Objective: Vitals:   04/12/21 1833 04/12/21 1920 04/13/21 0307 04/13/21 1348  BP: (!) 129/97 (!) 120/58 139/75 118/66  Pulse: 98 68 73 65  Resp: 18 15 16 17   Temp: 98.6 F (37 C) 97.8 F (36.6 C) 98.8 F (37.1 C) 98.7 F (37.1 C)  TempSrc:  Oral Oral Oral  SpO2:  95% 94% 97%    Intake/Output Summary (Last 24 hours) at 04/13/2021 1421 Last data filed at 04/13/2021 1300 Gross per 24 hour  Intake 238 ml  Output 3805 ml  Net -3567 ml   There were no vitals filed for this visit.  Examination:  General exam: Appears comfortable, deconditioned, not in any distress. Respiratory system: Clear to auscultation bilaterally, respiratory effort normal, RR 12 Cardiovascular system: S1 & S2 heard, regular rate and rhythm, no murmur. Gastrointestinal system: Abdomen is soft, non tender, non distended, BS+ Central nervous system: Alert and oriented x 1 . No focal neurological deficits. Extremities: +Edema, no cyanosis, no clubbing. Skin: No rashes, lesions or ulcers Psychiatry: . Mood & affect appropriate.     Data Reviewed: I have personally reviewed following labs and imaging studies  CBC: Recent Labs  Lab 04/09/21 1053 04/10/21 0708 04/13/21 0523  WBC 9.2 8.0 7.9  NEUTROABS 7.1  --   --   HGB 12.9 11.0* 10.9*  HCT 41.0 35.1* 34.3*  MCV 81.5 83.0 82.5  PLT 191 153 546*   Basic Metabolic Panel: Recent Labs  Lab 04/09/21 1053 04/09/21 1748 04/10/21 0708 04/12/21 1018 04/13/21 0523  NA 140  --  139 137 139  K 3.4*  --  3.0* 3.3* 3.1*  CL 104  --  105 104 105  CO2 24  --  27 25 27   GLUCOSE 139*  --  91 137* 97  BUN 18  --  15 14 12   CREATININE 0.79  --  0.59 0.65 0.57  CALCIUM 9.0  --  8.1* 7.8* 7.8*  MG  --  2.0  --    --   --    GFR: CrCl cannot be calculated (Unknown ideal weight.). Liver Function Tests: Recent Labs  Lab 04/09/21 1053 04/10/21 0708  AST 26 16  ALT 16 12  ALKPHOS 97 76  BILITOT 0.9 1.2  PROT 6.4* 5.3*  ALBUMIN 3.7 3.1*   Recent Labs  Lab 04/09/21 1053  LIPASE 32   No results for input(s): AMMONIA in the last 168 hours. Coagulation Profile: Recent Labs  Lab 04/09/21 1053 04/10/21 0708  INR 1.0 1.1   Cardiac Enzymes: No results for input(s): CKTOTAL, CKMB, CKMBINDEX, TROPONINI in the last 168 hours. BNP (last 3 results) No results for input(s): PROBNP in the last 8760 hours. HbA1C: No results for input(s): HGBA1C in the last 72 hours. CBG: No results for input(s): GLUCAP in the last 168 hours. Lipid Profile: No results for input(s): CHOL, HDL, LDLCALC,  TRIG, CHOLHDL, LDLDIRECT in the last 72 hours. Thyroid Function Tests: No results for input(s): TSH, T4TOTAL, FREET4, T3FREE, THYROIDAB in the last 72 hours. Anemia Panel: No results for input(s): VITAMINB12, FOLATE, FERRITIN, TIBC, IRON, RETICCTPCT in the last 72 hours. Sepsis Labs: Recent Labs  Lab 04/09/21 1053 04/09/21 1358 04/09/21 1432 04/09/21 1748 04/09/21 2358 04/10/21 0708  PROCALCITON  --   --  <0.10  --   --  <0.10  LATICACIDVEN 2.9* 2.1*  --  1.2 1.2  --     Recent Results (from the past 240 hour(s))  Blood Culture (routine x 2)     Status: None (Preliminary result)   Collection Time: 04/09/21 10:53 AM   Specimen: BLOOD  Result Value Ref Range Status   Specimen Description   Final    BLOOD BLOOD LEFT FOREARM Performed at [facility], [place] [address] [place], [place] [address]    Special Requests   Final    BOTTLES DRAWN AEROBIC AND ANAEROBIC Blood Culture adequate volume Performed at [place] [address], [place], [place] [address]    Culture   Final    NO GROWTH 4 DAYS Performed at [facility], [place] [address], [place], [place]  [address]    Report Status PENDING  Incomplete  Blood Culture (routine x 2)     Status: None (Preliminary result)   Collection Time: 04/09/21 10:55 AM   Specimen: BLOOD  Result Value Ref Range Status   Specimen Description   Final    BLOOD RIGHT ANTECUBITAL Performed at [facility] [address] [place], [place] [address]    Special Requests   Final    BOTTLES DRAWN AEROBIC AND ANAEROBIC Blood Culture adequate volume Performed at [facility] [address], [place], [place] [address]    Culture   Final    NO GROWTH 4 DAYS Performed at [facility], [place] [address] [place], [place] [address]    Report Status PENDING  Incomplete  Urine Culture     Status: None   Collection Time: 04/09/21  1:58 PM   Specimen: In/Out Cath Urine  Result Value Ref Range Status   Specimen Description   Final    IN/OUT CATH URINE Performed at [facility] [address] [place], [place] [address]    Special Requests   Final    NONE Performed at [facility], [place] [address] [place], [place] [address]    Culture   Final    NO GROWTH Performed at [facility], [place] [address] [place],  [address]    Report Status 04/10/2021 FINAL  Final  Resp Panel by RT-PCR (Flu A&B, Covid) Nasopharyngeal Swab     Status: None   Collection Time: 04/09/21  3:22 PM   Specimen: Nasopharyngeal Swab; Nasopharyngeal(NP) swabs in vial transport medium  Result Value Ref Range Status   SARS Coronavirus 2 by RT PCR NEGATIVE NEGATIVE Final    Comment: (NOTE) SARS-CoV-2 target nucleic acids are NOT DETECTED.  The SARS-CoV-2 RNA is generally detectable in upper respiratory specimens during the acute phase of infection. The lowest concentration of SARS-CoV-2 viral copies this assay can detect is 138 copies/mL. A negative result does not preclude SARS-Cov-2 infection and should not be used as the sole basis for treatment  or other patient management decisions. A negative result may occur with  improper specimen collection/handling, submission of specimen other than nasopharyngeal  swab, presence of viral mutation(s) within the areas targeted by this assay, and inadequate number of viral copies(<138 copies/mL). A negative result must be combined with clinical observations, patient history, and epidemiological information. The expected result is Negative.  Fact Sheet for Patients:  EntrepreneurPulse.com.au  Fact Sheet for Healthcare Providers:  IncredibleEmployment.be  This test is no t yet approved or cleared by the [place] FDA and  has been authorized for detection and/or diagnosis of SARS-CoV-2 by FDA under an Emergency Use Authorization (EUA). This EUA will remain  in effect (meaning this test can be used) for the duration of the COVID-19 declaration under Section 564(b)(1) of the Act, 21 U.S.C.section 360bbb-3(b)(1), unless the authorization is terminated  or revoked sooner.       Influenza A by PCR NEGATIVE NEGATIVE Final   Influenza B by PCR NEGATIVE NEGATIVE Final    Comment: (NOTE) The Xpert Xpress SARS-CoV-2/FLU/RSV plus assay is intended as an aid in the diagnosis of influenza from Nasopharyngeal swab specimens and should not be used as a sole basis for treatment. Nasal washings and aspirates are unacceptable for Xpert Xpress SARS-CoV-2/FLU/RSV testing.  Fact Sheet for Patients: EntrepreneurPulse.com.au  Fact Sheet for Healthcare Providers: IncredibleEmployment.be  This test is not yet approved or cleared by the [place] FDA and has been authorized for detection and/or diagnosis of SARS-CoV-2 by FDA under an Emergency Use Authorization (EUA). This EUA will remain in effect (meaning this test can be used) for the duration of the COVID-19 declaration under Section 564(b)(1) of the Act, 21 U.S.C. section  360bbb-3(b)(1), unless the authorization is terminated or revoked.  Performed at [facility], [place] [address], [place], [place] [address]    Radiology Studies: No results found.  Scheduled Meds:  busPIRone  10 mg Oral TID   Chlorhexidine Gluconate Cloth  6 each Topical Daily   famotidine  20 mg Oral QHS   memantine  28 mg Oral Daily   metoprolol succinate  25 mg Oral Daily   potassium chloride  40 mEq Oral Daily   potassium chloride SA  40 mEq Oral Once   QUEtiapine  25 mg Oral QHS   sertraline  75 mg Oral q AM   torsemide  20 mg Oral Daily   Continuous Infusions:  sodium chloride 50 mL/hr at 04/11/21 1330   ceFEPime (MAXIPIME) IV 2 g (04/13/21 0305)     LOS: 3 days    Time spent: 35 min    Yashika Mask, MD [facility]   If 7PM-7AM, please contact night-coverage

## 2021-04-13 NOTE — Care Management Important Message (Signed)
Important Message  Patient Details IM Letter placed in Patients room. Name: Jasmine Jones MRN: [id number] Date of Birth: [birth year]   Medicare Important Message Given:  Yes     Kerin Salen 04/13/2021, 9:52 AM

## 2021-04-13 NOTE — TOC Progression Note (Addendum)
Transition of Care [facility]) - Progression Note    Patient Details  Name: Jasmine Jones MRN: [id number] Date of Birth: 03/01/1926  Transition of Care [facility]) CM/SW [place], [place] Phone Number: 04/13/2021, 8:48 AM  Clinical Narrative:   Received call from [facility] at [place] stating family wants rehab, not hospice. TOC will continue to follow during the course of hospitalization. Addendum: Clarifying with [facility] whether patient had qualified for residential hospice or not.     Expected Discharge Plan:  (To be determined) Barriers to Discharge: Continued Medical Work up  Expected Discharge Plan and Services Expected Discharge Plan:  (To be determined)                                               Social Determinants of Health (SDOH) Interventions    Readmission Risk Interventions No flowsheet data found.

## 2021-04-13 NOTE — Progress Notes (Signed)
Speech Language Pathology Treatment: Dysphagia  Patient Details Name: Jasmine Jones MRN: [id number] DOB: 29-Mar-1925 Today's Date: 04/13/2021 Time: 1450-1510 SLP Time Calculation (min) (ACUTE ONLY): 20 min  Assessment / Plan / Recommendation Clinical Impression  Patient seen by SLP with caregiver present in room and focus of session on dysphagia goals. RN and caregiver both reporting that patient having a lot of difficulty managing solid texture PO's. Per caregiver, even with boluses of ice cream, patient requesting a sip of water to seemingly help it transit and she did notice times of patient with ice cream residuals still in mouth as if she needs to be told to swallow. SLP observed patient with consecutive straw sips of thin liquids which she tolerated well with only one mild cough response and appearance of timely swallow initiation, some instances of multiple swallows. With puree solids (applesauce), patient verbally requesting sips of drinks after majority of spoon bites. She exhibited some belching at end of session consistent with her h/o GERD. SLP provided educated to caregiver and will attempt to contact one of patient's nieces later today. It seems that patient may be exhibiting some pharyngeal and esophageal motility delays as well as likely dry oral and possibly pharyngeal mucosa, causing her with difficulty swallowing more solid and heavier boluses. SLP recommending downgrade to puree solids, continue thin liquids and recommend offering sip of liquid after each bite of puree. SLP will continue to follow patient for diet toleration and ability to advance.    HPI HPI: 88 y.o. female presented to ED from [facility] with fever and weakness.  PMHx anxiety, advancing dementia with increased irritability and refusals to eat, GERD, afib, HTN. CT head no acute finding. DG chest 2/3 no signs of pulm edema or consolidation.      SLP Plan  Continue with current plan of care       Recommendations for follow up therapy are one component of a multi-disciplinary discharge planning process, led by the attending physician.  Recommendations may be updated based on patient status, additional functional criteria and insurance authorization.    Recommendations  Diet recommendations: Dysphagia 1 (puree);Thin liquid Liquids provided via: Cup;Straw Medication Administration: Whole meds with liquid Supervision: Full supervision/cueing for compensatory strategies;Staff to assist with self feeding;Trained caregiver to feed patient Compensations: Small sips/bites;Slow rate;Follow solids with liquid Postural Changes and/or Swallow Maneuvers: Seated upright 90 degrees                Oral Care Recommendations: Oral care BID;Staff/trained caregiver to provide oral care Follow Up Recommendations: No SLP follow up Assistance recommended at discharge: None SLP Visit Diagnosis: Dysphagia, oral phase (R13.11) Plan: Continue with current plan of care          Sonia Baller, MA, CCC-SLP Speech Therapy

## 2021-04-14 DIAGNOSIS — F03C11 Unspecified dementia, severe, with agitation: Secondary | ICD-10-CM

## 2021-04-14 DIAGNOSIS — Z7189 Other specified counseling: Secondary | ICD-10-CM

## 2021-04-14 DIAGNOSIS — Z515 Encounter for palliative care: Secondary | ICD-10-CM

## 2021-04-14 DIAGNOSIS — R509 Fever, unspecified: Secondary | ICD-10-CM | POA: Diagnosis not present

## 2021-04-14 DIAGNOSIS — A419 Sepsis, unspecified organism: Secondary | ICD-10-CM

## 2021-04-14 LAB — CULTURE, BLOOD (ROUTINE X 2)
Culture: NO GROWTH
Culture: NO GROWTH
Special Requests: ADEQUATE
Special Requests: ADEQUATE

## 2021-04-14 NOTE — Progress Notes (Addendum)
PROGRESS NOTE    Jasmine Jones  HCW:[id number] DOB: [birth year] DOA: 04/09/2021  PCP: [name], NP   Brief Narrative:  This 88 years old female with PMH significant for anxiety, dementia, GERD, A-fib, hypertension presented in the ED with generalized weakness and febrile illness.  History is difficult to obtain from patient as she has significant dementia and caregiver is unclear of what happened.  The caregiver reports that the patient was found to be confused, she seems to move slower than her usual self and getting about her day.  She was also having difficulty walking and for some reason they decided to check her temperature it was found to be 102.4,  they gave her Tylenol and sent to the ED.  Patient also reported she fell in the bathroom last night and complains of right hip pain.  She is unable to tell the mechanism of the fall. The caregiver at bedside reported she fell last week and that she is often confused about timelines for certain events.  Patient was admitted for fever of unknown origin.So far remains afebrile, lactic acidosis resolved.  PT recommended SNF.  Awaiting authorization.  Assessment & Plan:   Principal Problem:   Fever of unknown origin Active Problems:   Essential hypertension   ATRIAL FIBRILLATION   Dementia (HCC)   Hypokalemia  Fever of unknown origin: Lactic acidosis: Patient presented with fever, no leukocytosis, UA and chest x-ray unremarkable. No fever recorded here.  Influenza negative, COVID-negative Denies any diarrhea or urinary symptoms. Initiated empiric antibiotics (Cipro and Flagyl.). Procalcitonin 0.10, lactic acid normalized with IV hydration. CT A/P: Irregular and consolidative densities in the bilateral lung bases, most likely representing possible pneumonia. Patient remains afebrile. Sepsis ruled out. Completed course of cefepime and Flagyl. Blood cultures no growth so far.  Hypokalemia: Replaced.  Continue to  monitor  Right hip pain status post fall: Continue adequate pain control. Posttraumatic deformity of the proximal right femur status post ORIF for an intertrochanteric fracture.   No acute fracture or dislocation noted. Continue Tylenol as needed.  Dementia: Continue Namenda and Seroquel.  Elevated troponins: Denies any chest pain, EKG with previously seen RBBB. Troponins are flat 27> 30 Continue telemetry  Essential hypertension: Continue metoprolol.    Paroxysmal A-fib: Continue metoprolol. Not on anticoagulation due to dementia and risk of falls  Chronic bilateral lower extremity edema: Torsemide 20 mg daily Monitor daily weight, intake output charting.  DVT prophylaxis: SCDs Code Status: DNR Family Communication: Caregiver at bedside Disposition Plan:   Status is: Inpatient Remains inpatient appropriate because:   Admitted for fever of unknown origin, requiring IV antibiotics and work-up.  Blood cultures negative. Completed antibiotic course.  PT and OT evaluation recommended  SNF  Consultants:  None Procedures: None.  Antimicrobials:   Anti-infectives (From admission, onward)    Start     Dose/Rate Route Frequency Ordered Stop   04/10/21 0300  ceFEPIme (MAXIPIME) 2 g in sodium chloride 0.9 % 100 mL IVPB        2 g 200 mL/hr over 30 Minutes Intravenous Every 12 hours 04/09/21 1509 04/14/21 0419   04/10/21 0200  metroNIDAZOLE (FLAGYL) IVPB 500 mg  Status:  Discontinued        500 mg 100 mL/hr over 60 Minutes Intravenous Every 12 hours 04/09/21 1617 04/13/21 1112   04/09/21 1415  ceFEPIme (MAXIPIME) 2 g in sodium chloride 0.9 % 100 mL IVPB        2 g 200 mL/hr over 30  Minutes Intravenous  Once 04/09/21 1404 04/09/21 1616   04/09/21 1415  metroNIDAZOLE (FLAGYL) IVPB 500 mg        500 mg 100 mL/hr over 60 Minutes Intravenous  Once 04/09/21 1404 04/09/21 1616   04/09/21 1415  vancomycin (VANCOCIN) IVPB 1000 mg/200 mL premix        1,000 mg 200 mL/hr over  60 Minutes Intravenous  Once 04/09/21 1404 04/09/21 1624        Subjective: Patient was seen and examined at bedside.  Overnight events noted. She is hard of hearing but following commands. She appears much improved. She seems back to her baseline mental status.  Objective: Vitals:   04/13/21 1348 04/13/21 1958 04/14/21 0325 04/14/21 1352  BP: 118/66 (!) 150/81 (!) 150/78 127/71  Pulse: 65 75 82 73  Resp: 17 20 20 20   Temp: 98.7 F (37.1 C) 98.7 F (37.1 C) 98.7 F (37.1 C) 98 F (36.7 C)  TempSrc: Oral Axillary Axillary Oral  SpO2: 97% 90% 96% 94%    Intake/Output Summary (Last 24 hours) at 04/14/2021 1411 Last data filed at 04/14/2021 0600 Gross per 24 hour  Intake 900 ml  Output 1100 ml  Net -200 ml   There were no vitals filed for this visit.  Examination:  General exam: Appears deconditioned, comfortable, not in any distress. Respiratory system: CTA bilaterally, respiratory effort normal, RR 16 Cardiovascular system: S1 & S2 heard, regular rate and rhythm, no murmur. Gastrointestinal system: Abdomen is soft, non tender, non distended, BS+ Central nervous system: Alert and oriented x 1 . No focal neurological deficits. Extremities: +Edema, no cyanosis, no clubbing. Skin: No rashes, lesions or ulcers Psychiatry: . Mood & affect appropriate.     Data Reviewed: I have personally reviewed following labs and imaging studies  CBC: Recent Labs  Lab 04/09/21 1053 04/10/21 0708 04/13/21 0523  WBC 9.2 8.0 7.9  NEUTROABS 7.1  --   --   HGB 12.9 11.0* 10.9*  HCT 41.0 35.1* 34.3*  MCV 81.5 83.0 82.5  PLT 191 153 431*   Basic Metabolic Panel: Recent Labs  Lab 04/09/21 1053 04/09/21 1748 04/10/21 0708 04/12/21 1018 04/13/21 0523  NA 140  --  139 137 139  K 3.4*  --  3.0* 3.3* 3.1*  CL 104  --  105 104 105  CO2 24  --  27 25 27   GLUCOSE 139*  --  91 137* 97  BUN 18  --  15 14 12   CREATININE 0.79  --  0.59 0.65 0.57  CALCIUM 9.0  --  8.1* 7.8* 7.8*  MG  --   2.0  --   --   --    GFR: CrCl cannot be calculated (Unknown ideal weight.). Liver Function Tests: Recent Labs  Lab 04/09/21 1053 04/10/21 0708  AST 26 16  ALT 16 12  ALKPHOS 97 76  BILITOT 0.9 1.2  PROT 6.4* 5.3*  ALBUMIN 3.7 3.1*   Recent Labs  Lab 04/09/21 1053  LIPASE 32   No results for input(s): AMMONIA in the last 168 hours. Coagulation Profile: Recent Labs  Lab 04/09/21 1053 04/10/21 0708  INR 1.0 1.1   Cardiac Enzymes: No results for input(s): CKTOTAL, CKMB, CKMBINDEX, TROPONINI in the last 168 hours. BNP (last 3 results) No results for input(s): PROBNP in the last 8760 hours. HbA1C: No results for input(s): HGBA1C in the last 72 hours. CBG: No results for input(s): GLUCAP in the last 168 hours. Lipid Profile: No results for  input(s): CHOL, HDL, LDLCALC, TRIG, CHOLHDL, LDLDIRECT in the last 72 hours. Thyroid Function Tests: No results for input(s): TSH, T4TOTAL, FREET4, T3FREE, THYROIDAB in the last 72 hours. Anemia Panel: No results for input(s): VITAMINB12, FOLATE, FERRITIN, TIBC, IRON, RETICCTPCT in the last 72 hours. Sepsis Labs: Recent Labs  Lab 04/09/21 1053 04/09/21 1358 04/09/21 1432 04/09/21 1748 04/09/21 2358 04/10/21 0708  PROCALCITON  --   --  <0.10  --   --  <0.10  LATICACIDVEN 2.9* 2.1*  --  1.2 1.2  --     Recent Results (from the past 240 hour(s))  Blood Culture (routine x 2)     Status: None   Collection Time: 04/09/21 10:53 AM   Specimen: BLOOD  Result Value Ref Range Status   Specimen Description   Final    BLOOD BLOOD LEFT FOREARM Performed at [facility], [place] [address], [place], [place] [address]    Special Requests   Final    BOTTLES DRAWN AEROBIC AND ANAEROBIC Blood Culture adequate volume Performed at [place] [address], [place], [place] [address]    Culture   Final    NO GROWTH 5 DAYS Performed at [facility], [place] [address], [place], [place] [address]     Report Status 04/14/2021 FINAL  Final  Blood Culture (routine x 2)     Status: None   Collection Time: 04/09/21 10:55 AM   Specimen: BLOOD  Result Value Ref Range Status   Specimen Description   Final    BLOOD RIGHT ANTECUBITAL Performed at [place] [address] [place], [place] [address]    Special Requests   Final    BOTTLES DRAWN AEROBIC AND ANAEROBIC Blood Culture adequate volume Performed at [place] [address] [place], [place] [address]    Culture   Final    NO GROWTH 5 DAYS Performed at [facility], [place] [address] [place], [place] [address]    Report Status 04/14/2021 FINAL  Final  Urine Culture     Status: None   Collection Time: 04/09/21  1:58 PM   Specimen: In/Out Cath Urine  Result Value Ref Range Status   Specimen Description   Final    IN/OUT CATH URINE Performed at [place] [address] [place], [place] [address]    Special Requests   Final    NONE Performed at [facility], [place] [address] [place], [place] [address]    Culture   Final    NO GROWTH Performed at [facility], [place] [address], [place], [place] [address]    Report Status 04/10/2021 FINAL  Final  Resp Panel by RT-PCR (Flu A&B, Covid) Nasopharyngeal Swab     Status: None   Collection Time: 04/09/21  3:22 PM   Specimen: Nasopharyngeal Swab; Nasopharyngeal(NP) swabs in vial transport medium  Result Value Ref Range Status   SARS Coronavirus 2 by RT PCR NEGATIVE NEGATIVE Final    Comment: (NOTE) SARS-CoV-2 target nucleic acids are NOT DETECTED.  The SARS-CoV-2 RNA is generally detectable in upper respiratory specimens during the acute phase of infection. The lowest concentration of SARS-CoV-2 viral copies this assay can detect is 138 copies/mL. A negative result does not preclude SARS-Cov-2 infection and should not be used as the sole basis for treatment or other patient management  decisions. A negative result may occur with  improper specimen collection/handling, submission of specimen other  than nasopharyngeal swab, presence of viral mutation(s) within the areas targeted by this assay, and inadequate number of viral copies(<138 copies/mL). A negative result must be combined with clinical observations, patient history, and epidemiological information. The expected result is Negative.  Fact Sheet for Patients:  EntrepreneurPulse.com.au  Fact Sheet for Healthcare Providers:  IncredibleEmployment.be  This test is no t yet approved or cleared by the [place] FDA and  has been authorized for detection and/or diagnosis of SARS-CoV-2 by FDA under an Emergency Use Authorization (EUA). This EUA will remain  in effect (meaning this test can be used) for the duration of the COVID-19 declaration under Section 564(b)(1) of the Act, 21 U.S.C.section 360bbb-3(b)(1), unless the authorization is terminated  or revoked sooner.       Influenza A by PCR NEGATIVE NEGATIVE Final   Influenza B by PCR NEGATIVE NEGATIVE Final    Comment: (NOTE) The Xpert Xpress SARS-CoV-2/FLU/RSV plus assay is intended as an aid in the diagnosis of influenza from Nasopharyngeal swab specimens and should not be used as a sole basis for treatment. Nasal washings and aspirates are unacceptable for Xpert Xpress SARS-CoV-2/FLU/RSV testing.  Fact Sheet for Patients: EntrepreneurPulse.com.au  Fact Sheet for Healthcare Providers: IncredibleEmployment.be  This test is not yet approved or cleared by the [place] FDA and has been authorized for detection and/or diagnosis of SARS-CoV-2 by FDA under an Emergency Use Authorization (EUA). This EUA will remain in effect (meaning this test can be used) for the duration of the COVID-19 declaration under Section 564(b)(1) of the Act, 21 U.S.C. section 360bbb-3(b)(1), unless the  authorization is terminated or revoked.  Performed at [facility], [place] [address] [place], [place] [address]    Radiology Studies: No results found.  Scheduled Meds:  busPIRone  10 mg Oral TID   Chlorhexidine Gluconate Cloth  6 each Topical Daily   famotidine  20 mg Oral QHS   memantine  28 mg Oral Daily   metoprolol succinate  25 mg Oral Daily   potassium chloride  40 mEq Oral Daily   QUEtiapine  25 mg Oral QHS   sertraline  75 mg Oral q AM   torsemide  20 mg Oral Daily   Continuous Infusions:  sodium chloride 50 mL/hr at 04/14/21 1312     LOS: 4 days    Time spent: 46 min    Jasman Pfeifle, MD [facility]   If 7PM-7AM, please contact night-coverage

## 2021-04-14 NOTE — NC FL2 (Signed)
[place] MEDICAID FL2 LEVEL OF CARE SCREENING TOOL     IDENTIFICATION  Patient Name: Jasmine Jones Birthdate: [birth year] Sex: female Admission Date (Current Location): 04/09/2021  [facility] and [place] Number:  Herbalist and Address:  [facility],  [place], [place]      Provider Number: [id number]  Attending Physician Name and Address:  Shawna Clamp, MD  Relative Name and Phone Number:  Scotton,Sue Niece     [phone] POA    Current Level of Care: Hospital Recommended Level of Care: [place] Prior Approval Number:    Date Approved/Denied:   PASRR Number: [id number]  Discharge Plan: SNF    Current Diagnoses: Patient Active Problem List   Diagnosis Date Noted   Fever of unknown origin 04/09/2021   Hypokalemia 04/09/2021   Aortic atherosclerosis ([place]) 09/30/2020   Closed right hip fracture, initial encounter ([place]) 12/27/2018   Depression with anxiety 12/27/2018   Dementia ([place]) 12/27/2018   GOITER, MULTINODULAR 08/15/2007   CHEST PAIN 08/15/2007   HYPERLIPIDEMIA 08/14/2007   INSOMNIA, CHRONIC 08/14/2007   Essential hypertension 08/14/2007   ATRIAL FIBRILLATION 08/14/2007   Osteoarthritis 08/14/2007   Osteoporosis 08/14/2007    Orientation RESPIRATION BLADDER Height & Weight     Self, Place  O2 (2L [place]) External catheter Weight:   Height:     BEHAVIORAL SYMPTOMS/MOOD NEUROLOGICAL BOWEL NUTRITION STATUS      Incontinent Diet (see d/c summary)  AMBULATORY STATUS COMMUNICATION OF NEEDS Skin   Extensive Assist Verbally Normal                       Personal Care Assistance Level of Assistance  Bathing, Feeding, Dressing Bathing Assistance: Maximum assistance Feeding assistance: Limited assistance Dressing Assistance: Maximum assistance     Functional Limitations Info  Sight, Hearing, Speech Sight Info: Adequate Hearing Info: Impaired Speech Info: Adequate    SPECIAL CARE FACTORS  FREQUENCY  PT (By licensed PT), OT (By licensed OT)     PT Frequency: 5X/W OT Frequency: 5X/W            Contractures Contractures Info: Not present    Additional Factors Info  Code Status, Allergies Code Status Info: DNR Allergies Info: NKA           Current Medications (04/14/2021):  This is the current hospital active medication list Current Facility-Administered Medications  Medication Dose Route Frequency Provider Last Rate Last Admin   0.9 %  sodium chloride infusion   Intravenous Continuous Shawna Clamp, MD 50 mL/hr at 04/14/21 1312 New Bag at 04/14/21 1312   acetaminophen (TYLENOL) tablet 650 mg  650 mg Oral Q6H PRN Cherylann Ratel A, DO   650 mg at 04/13/21 1037   Or   acetaminophen (TYLENOL) suppository 650 mg  650 mg Rectal Q6H PRN Marylyn Ishihara, Tyrone A, DO       busPIRone (BUSPAR) tablet 10 mg  10 mg Oral TID Marylyn Ishihara, Tyrone A, DO   10 mg at 04/14/21 1039   Chlorhexidine Gluconate Cloth 2 % PADS 6 each  6 each Topical Daily Shawna Clamp, MD   6 each at 04/14/21 1138   docusate sodium (COLACE) capsule 100 mg  100 mg Oral BID PRN Cherylann Ratel A, DO       famotidine (PEPCID) tablet 20 mg  20 mg Oral QHS Kyle, Tyrone A, DO   20 mg at 04/13/21 2142   haloperidol lactate (HALDOL) injection 5 mg  5 mg  Intravenous Q6H PRN Lavena Bullion, NP   5 mg at 04/14/21 0431   HYDROcodone-acetaminophen (NORCO/VICODIN) 5-325 MG per tablet 1-2 tablet  1-2 tablet Oral Q4H PRN Marylyn Ishihara, Tyrone A, DO       LORazepam (ATIVAN) injection 0.5 mg  0.5 mg Intravenous Q6H PRN Marylyn Ishihara, Tyrone A, DO   0.5 mg at 04/14/21 0604   memantine (NAMENDA XR) 24 hr capsule 28 mg  28 mg Oral Daily Kyle, Tyrone A, DO   28 mg at 04/14/21 1038   metoprolol succinate (TOPROL-XL) 24 hr tablet 25 mg  25 mg Oral Daily Kyle, Tyrone A, DO   25 mg at 04/14/21 1039   ondansetron (ZOFRAN) tablet 4 mg  4 mg Oral Q6H PRN Marylyn Ishihara, Tyrone A, DO       Or   ondansetron (ZOFRAN) injection 4 mg  4 mg Intravenous Q6H PRN Marylyn Ishihara, Tyrone A, DO        potassium chloride SA (KLOR-CON M) CR tablet 40 mEq  40 mEq Oral Daily Kyle, Tyrone A, DO   40 mEq at 04/14/21 1039   QUEtiapine (SEROQUEL) tablet 25 mg  25 mg Oral QHS Kyle, Tyrone A, DO   25 mg at 04/13/21 2142   sertraline (ZOLOFT) tablet 75 mg  75 mg Oral q AM Marylyn Ishihara, Tyrone A, DO   75 mg at 04/14/21 1038   torsemide (DEMADEX) tablet 20 mg  20 mg Oral Daily Shawna Clamp, MD   20 mg at 04/14/21 1039     Discharge Medications: Please see discharge summary for a list of discharge medications.  Relevant Imaging Results:  Relevant Lab Results:   Additional Information [place], [place]

## 2021-04-14 NOTE — Progress Notes (Signed)
Daily Progress Note   Patient Name: Jasmine Jones       Date: 04/14/2021 DOB: 12/29/1925  Age: 88 y.o. MRN#: [id number] Attending Physician: Shawna Clamp, MD Primary Care Physician: [name], NP Admit Date: 04/09/2021  Reason for Consultation/Follow-up: Establishing goals of care  Subjective: I saw and examined Jasmine Jones today.  She was lying in bed in no distress.  She remains confused cannot participate in goals of care conversation.  I called was able to reach her niece, Corwin Levins.  We discussed her clinical course and continued comorbidities as well as options for care moving forward.  She understands she is not a candidate for residential hospice.  She does not feel, however, that she is safe to return to [facility] in her current state.  We discussed potential care plans including consideration for short-term SNF for rehab to see how much functional status she can regain.  Also discussed that if she cannot make gains with rehab, she may be approaching the transition point of needing higher level of care than could be provided assisted living.  Discussed that she may qualify for hospice services at assisted living but this would still not give her enough support to continue to live alone in assisted living and may need to do trial of rehab to see if she can regain enough functional status to go back to [facility].  Length of Stay: 4  Current Medications: Scheduled Meds:   busPIRone  10 mg Oral TID   Chlorhexidine Gluconate Cloth  6 each Topical Daily   famotidine  20 mg Oral QHS   memantine  28 mg Oral Daily   metoprolol succinate  25 mg Oral Daily   potassium chloride  40 mEq Oral Daily   QUEtiapine  25 mg Oral QHS   sertraline  75 mg Oral q AM    torsemide  20 mg Oral Daily    Continuous Infusions:  sodium chloride 50 mL/hr at 04/14/21 1312    PRN Meds: acetaminophen **OR** acetaminophen, docusate sodium, haloperidol lactate, HYDROcodone-acetaminophen, LORazepam, ondansetron **OR** ondansetron (ZOFRAN) IV  Physical Exam         General: Alert, awake, in no acute distress.  Confused but redirectable Heart: Regular rate and rhythm. No murmur appreciated. Lungs: Good air movement, clear Abdomen: Soft, nontender, nondistended,  positive bowel sounds.   Ext: No significant edema Skin: Warm and dry   Vital Signs: BP (!) 146/84 (BP Location: Right Arm)    Pulse 86    Temp 98.1 F (36.7 C) (Axillary)    Resp 20    SpO2 92%  SpO2: SpO2: 92 % O2 Device: O2 Device: Nasal Cannula O2 Flow Rate: O2 Flow Rate (L/min): 2 L/min  Intake/output summary:  Intake/Output Summary (Last 24 hours) at 04/14/2021 2206 Last data filed at 04/14/2021 1846 Gross per 24 hour  Intake 1157.9 ml  Output 1400 ml  Net -242.1 ml   LBM: Last BM Date: 04/12/21 Baseline Weight:   Most recent weight:         Palliative Assessment/Data:      Patient Active Problem List   Diagnosis Date Noted   Fever of unknown origin 04/09/2021   Hypokalemia 04/09/2021   Aortic atherosclerosis ([place]) 09/30/2020   Closed right hip fracture, initial encounter ([place]) 12/27/2018   Depression with anxiety 12/27/2018   Dementia ([place]) 12/27/2018   GOITER, MULTINODULAR 08/15/2007   CHEST PAIN 08/15/2007   HYPERLIPIDEMIA 08/14/2007   INSOMNIA, CHRONIC 08/14/2007   Essential hypertension 08/14/2007   ATRIAL FIBRILLATION 08/14/2007   Osteoarthritis 08/14/2007   Osteoporosis 08/14/2007    Palliative Care Assessment & Plan   Patient Profile: 88 y.o. female  with past medical history of dementia, a-fib, GERD, anxiety, and HTN. She presented to the emergency department on 04/09/2021 with altered mental status and weakness.   Recommendations/Plan: DNR/DNI Discussed with [name]  (one of her 2 nieces/surrogates) regarding options at time of discharge from the hospital.  Plan to discuss further with rest of the care team and will reach out again to her and her sister, [name], to discuss likely disposition options.  At this point, will likely be consideration for short-term rehab to see how much functional status she can regain.  Once it is determined what her maximal functional status is, it would be a matter of figuring out what the best living situation for her would be at that point.  She is not a candidate for residential hospice at this point, however, hospice may be part of her care plan in the near future depending upon how she does at rehab.   Code Status:    Code Status Orders  (From admission, onward)           Start     Ordered   04/09/21 1618  Do not attempt resuscitation (DNR)  Continuous       Question Answer Comment  In the event of cardiac or respiratory ARREST Do not call a code blue   In the event of cardiac or respiratory ARREST Do not perform Intubation, CPR, defibrillation or ACLS   In the event of cardiac or respiratory ARREST Use medication by any route, position, wound care, and other measures to relive pain and suffering. May use oxygen, suction and manual treatment of airway obstruction as needed for comfort.   Comments DNR confirmed w/ caregiver      04/09/21 1617           Code Status History     Date Active Date Inactive Code Status Order ID Comments User Context   04/12/2019 1517 04/09/2021 1021 DNR [id number]  [name], NP Outpatient   01/01/2019 1136 01/01/2019 1737 DNR [id number]  [place] Eric J, DO Inpatient   12/28/2018 0003 01/01/2019 1136 Full Code [id number]  Vianne Bulls, MD Inpatient  Thank you for allowing the Palliative Medicine Team to assist in the care of this patient.  Total time: 50 minutes  Micheline Rough, MD  Please contact Palliative Medicine Team phone at [phone] for questions and  concerns.

## 2021-04-15 ENCOUNTER — Telehealth: Payer: Self-pay

## 2021-04-15 DIAGNOSIS — R509 Fever, unspecified: Secondary | ICD-10-CM | POA: Diagnosis not present

## 2021-04-15 LAB — BASIC METABOLIC PANEL
Anion gap: 8 (ref 5–15)
BUN: 13 mg/dL (ref 8–23)
CO2: 24 mmol/L (ref 22–32)
Calcium: 7.9 mg/dL — ABNORMAL LOW (ref 8.9–10.3)
Chloride: 102 mmol/L (ref 98–111)
Creatinine, Ser: 0.44 mg/dL (ref 0.44–1.00)
GFR, Estimated: >60 mL/min (ref >60)
Glucose, Bld: 94 mg/dL (ref 70–99)
Potassium: 3.6 mmol/L (ref 3.5–5.1)
Sodium: 134 mmol/L — ABNORMAL LOW (ref 135–145)

## 2021-04-15 MED ORDER — ENOXAPARIN SODIUM 40 MG/0.4ML IJ SOSY
40.0000 mg | PREFILLED_SYRINGE | INTRAMUSCULAR | Status: DC
  Administered 2021-04-15 – 2021-04-20 (×6): 40 mg via SUBCUTANEOUS
  Filled 2021-04-15 (×6): qty 0.4

## 2021-04-15 MED ORDER — PANTOPRAZOLE SODIUM 40 MG PO TBEC
40.0000 mg | DELAYED_RELEASE_TABLET | Freq: Two times a day (BID) | ORAL | Status: DC
  Administered 2021-04-15 – 2021-04-20 (×12): 40 mg via ORAL
  Filled 2021-04-15 (×12): qty 1

## 2021-04-15 NOTE — Telephone Encounter (Signed)
Dr.Regalado from [facility] called. She wants Dr.Blackman to look at patient's CT scan and call her back at [phone] Thanks!

## 2021-04-15 NOTE — Progress Notes (Signed)
Daily Progress Note   Patient Name: Jasmine Jones       Date: 04/15/2021 DOB: Nov 12, 1925  Age: 88 y.o. MRN#: [id number] Attending Physician: Elmarie Shiley, MD Primary Care Physician: [name], NP Admit Date: 04/09/2021  Reason for Consultation/Follow-up: Establishing goals of care  Subjective: I saw and examined Jasmine Jones today.  She was lying in bed in no distress.  She remains confused cannot participate in goals of care conversation.  Caregiver, [name], was at the bedside.  Discussed that she fed her most of breakfast and half of lunch today.  I discussed with her niece [name], via phone.  Reviewed briefly potential options including trial of rehab at [facility].  She would like to see how she does with PT again and we discussed plan to meet in person tomorrow to discuss.  Length of Stay: 5  Current Medications: Scheduled Meds:   busPIRone  10 mg Oral TID   enoxaparin (LOVENOX) injection  40 mg Subcutaneous Q24H   memantine  28 mg Oral Daily   metoprolol succinate  25 mg Oral Daily   pantoprazole  40 mg Oral BID   potassium chloride  40 mEq Oral Daily   QUEtiapine  25 mg Oral QHS   sertraline  75 mg Oral q AM   torsemide  20 mg Oral Daily    Continuous Infusions:    PRN Meds: acetaminophen **OR** acetaminophen, docusate sodium, haloperidol lactate, HYDROcodone-acetaminophen, LORazepam, ondansetron **OR** ondansetron (ZOFRAN) IV  Physical Exam         General: Alert, awake, in no acute distress.  Confused but redirectable Heart: Regular rate and rhythm. No murmur appreciated. Lungs: Good air movement, clear Abdomen: Soft, nontender, nondistended, positive bowel sounds.   Ext: No significant edema Skin: Warm and dry   Vital Signs: BP 131/66 (BP Location:  Left Arm)    Pulse 74    Temp 97.9 F (36.6 C)    Resp 16    SpO2 95%  SpO2: SpO2: 95 % O2 Device: O2 Device: Nasal Cannula O2 Flow Rate: O2 Flow Rate (L/min): 2 L/min  Intake/output summary:  Intake/Output Summary (Last 24 hours) at 04/15/2021 2123 Last data filed at 04/15/2021 2048 Gross per 24 hour  Intake 240 ml  Output 2250 ml  Net -2010 ml    LBM: Last  BM Date: 04/14/21 Baseline Weight:   Most recent weight:         Palliative Assessment/Data:      Patient Active Problem List   Diagnosis Date Noted   Fever of unknown origin 04/09/2021   Hypokalemia 04/09/2021   Aortic atherosclerosis ([place]) 09/30/2020   Closed right hip fracture, initial encounter ([place]) 12/27/2018   Depression with anxiety 12/27/2018   Dementia ([place]) 12/27/2018   GOITER, MULTINODULAR 08/15/2007   CHEST PAIN 08/15/2007   HYPERLIPIDEMIA 08/14/2007   INSOMNIA, CHRONIC 08/14/2007   Essential hypertension 08/14/2007   ATRIAL FIBRILLATION 08/14/2007   Osteoarthritis 08/14/2007   Osteoporosis 08/14/2007    Palliative Care Assessment & Plan   Patient Profile: 88 y.o. female  with past medical history of dementia, a-fib, GERD, anxiety, and HTN. She presented to the emergency department on 04/09/2021 with altered mental status and weakness.   Recommendations/Plan: DNR/DNI Plan for family meeting in person tomorrow.  Code Status:    Code Status Orders  (From admission, onward)           Start     Ordered   04/09/21 1618  Do not attempt resuscitation (DNR)  Continuous       Question Answer Comment  In the event of cardiac or respiratory ARREST Do not call a code blue   In the event of cardiac or respiratory ARREST Do not perform Intubation, CPR, defibrillation or ACLS   In the event of cardiac or respiratory ARREST Use medication by any route, position, wound care, and other measures to relive pain and suffering. May use oxygen, suction and manual treatment of airway obstruction as needed for  comfort.   Comments DNR confirmed w/ caregiver      04/09/21 1617           Code Status History     Date Active Date Inactive Code Status Order ID Comments User Context   04/12/2019 3888 04/09/2021 1021 DNR [id number]  [name], NP Outpatient   01/01/2019 1136 01/01/2019 1737 DNR [id number]  [place] Eric J, DO Inpatient   12/28/2018 0003 01/01/2019 1136 Full Code [id number]  Vianne Bulls, MD Inpatient       Thank you for allowing the Palliative Medicine Team to assist in the care of this patient.  Micheline Rough, MD  Please contact Palliative Medicine Team phone at [phone] for questions and concerns.

## 2021-04-15 NOTE — Progress Notes (Signed)
PROGRESS NOTE    Jasmine Jones  KAJ:[id number] DOB: Feb 16, 1926 DOA: 04/09/2021 PCP: [name], NP   Brief Narrative: 88 year old with past medical history significant for anxiety, dementia, GERD, A-fib, hypertension presented to the ED with generalized weakness, febrile illness.  Patient was found to be confused, seems to move his lower than her usual self.  Her temperature was checked and it was at 102.  Patient also had a fall a week prior to admission.   Patient was admitted with fever of unknown origin.  She has remained afebrile.  Lactic acidosis has resolved.  She completed IV antibiotics for 5 days.  Assessment & Plan:   Principal Problem:   Fever of unknown origin Active Problems:   Essential hypertension   ATRIAL FIBRILLATION   Dementia (HCC)   Hypokalemia  1-Fever ;  Probably related to pneumonia. Lactic acidosis;  Patient presented with fever, no leukocytosis, UA and chest x-ray unremarkable. Influenza negative, COVID-negative. CT: Irregular and consolidative density in bilateral lung bases most likely representing possible pneumonia. Sepsis rule out Completed 5 days of cefepime and Flagyl. Concern For aspiration pneumonia component  2-Hypokalemia: Replaced  3-Right hip pain status post fall: Denies pain right hip. CT show posttraumatic deformity of proximal right femur status post ORIF for an intertrochanteric fracture. Discussed with Dr. Ninfa Linden who reviewed the CT, finding consistent with chronic changes.  Pain management.   4-Dementia: Continue with Namenda and Seroquel  Dysphagia;  Patient at times hold food in mouths, other time has regurgitation of food.  She is currently on dysphagia 1 diet.  I will add PPI BID  Recommend small frequent meals.   Elevated troponins: Flat elevation, may be related to demand ischemia.  Hypertension: Continue with metoprolol  Paroxysmal A-fib: Continue with metoprolol  Chronic bilateral lower  extremity edema: Continue with torsemide  Estimated body mass index is 25.65 kg/m as calculated from the following:   Height as of 03/29/21: 5\' 3"  (1.6 m).   Weight as of 03/29/21: 65.7 kg.   DVT prophylaxis: Lovenox Code Status: DNR Family Communication:Caregiver at bedside Disposition Plan:  Status is: Inpatient Remains inpatient appropriate because: delirium, awaiting disposition.             Consultants:  Palliative  Procedures:   Antimicrobials:    Subjective: She is sleepy, open eyes to voice, denies hip pain today.  Per caregiver she has been having trouble with swallowing, not eating a lot.   Objective: Vitals:   04/14/21 1352 04/14/21 2016 04/15/21 0408 04/15/21 1442  BP: 127/71 (!) 146/84 138/75 117/67  Pulse: 73 86 73 67  Resp: 20 20 18 20   Temp: 98 F (36.7 C) 98.1 F (36.7 C) 98.7 F (37.1 C) 98.8 F (37.1 C)  TempSrc: Oral Axillary Axillary Oral  SpO2: 94% 92% 96% 93%    Intake/Output Summary (Last 24 hours) at 04/15/2021 1528 Last data filed at 04/15/2021 1457 Gross per 24 hour  Intake 617.9 ml  Output 2550 ml  Net -1932.1 ml   There were no vitals filed for this visit.  Examination:  General exam: Appears calm and comfortable  Respiratory system: Clear to auscultation. Respiratory effort normal. Cardiovascular system: S1 & S2 heard, RRR.  Gastrointestinal system: Abdomen is nondistended, soft and nontender. No organomegaly or masses felt. Normal bowel sounds heard. Central nervous system: sleepy  Extremities: trace edema   Data Reviewed: I have personally reviewed following labs and imaging studies  CBC: Recent Labs  Lab 04/09/21 1053 04/10/21 0708  04/13/21 0523  WBC 9.2 8.0 7.9  NEUTROABS 7.1  --   --   HGB 12.9 11.0* 10.9*  HCT 41.0 35.1* 34.3*  MCV 81.5 83.0 82.5  PLT 191 153 119*   Basic Metabolic Panel: Recent Labs  Lab 04/09/21 1053 04/09/21 1748 04/10/21 0708 04/12/21 1018 04/13/21 0523 04/15/21 0452  NA  140  --  139 137 139 134*  K 3.4*  --  3.0* 3.3* 3.1* 3.6  CL 104  --  105 104 105 102  CO2 24  --  27 25 27 24   GLUCOSE 139*  --  91 137* 97 94  BUN 18  --  15 14 12 13   CREATININE 0.79  --  0.59 0.65 0.57 0.44  CALCIUM 9.0  --  8.1* 7.8* 7.8* 7.9*  MG  --  2.0  --   --   --   --    GFR: CrCl cannot be calculated (Unknown ideal weight.). Liver Function Tests: Recent Labs  Lab 04/09/21 1053 04/10/21 0708  AST 26 16  ALT 16 12  ALKPHOS 97 76  BILITOT 0.9 1.2  PROT 6.4* 5.3*  ALBUMIN 3.7 3.1*   Recent Labs  Lab 04/09/21 1053  LIPASE 32   No results for input(s): AMMONIA in the last 168 hours. Coagulation Profile: Recent Labs  Lab 04/09/21 1053 04/10/21 0708  INR 1.0 1.1   Cardiac Enzymes: No results for input(s): CKTOTAL, CKMB, CKMBINDEX, TROPONINI in the last 168 hours. BNP (last 3 results) No results for input(s): PROBNP in the last 8760 hours. HbA1C: No results for input(s): HGBA1C in the last 72 hours. CBG: No results for input(s): GLUCAP in the last 168 hours. Lipid Profile: No results for input(s): CHOL, HDL, LDLCALC, TRIG, CHOLHDL, LDLDIRECT in the last 72 hours. Thyroid Function Tests: No results for input(s): TSH, T4TOTAL, FREET4, T3FREE, THYROIDAB in the last 72 hours. Anemia Panel: No results for input(s): VITAMINB12, FOLATE, FERRITIN, TIBC, IRON, RETICCTPCT in the last 72 hours. Sepsis Labs: Recent Labs  Lab 04/09/21 1053 04/09/21 1358 04/09/21 1432 04/09/21 1748 04/09/21 2358 04/10/21 0708  PROCALCITON  --   --  <0.10  --   --  <0.10  LATICACIDVEN 2.9* 2.1*  --  1.2 1.2  --     Recent Results (from the past 240 hour(s))  Blood Culture (routine x 2)     Status: None   Collection Time: 04/09/21 10:53 AM   Specimen: BLOOD  Result Value Ref Range Status   Specimen Description   Final    BLOOD BLOOD LEFT FOREARM Performed at [facility], [place] [address], [place], [place] [address]    Special Requests   Final    BOTTLES  DRAWN AEROBIC AND ANAEROBIC Blood Culture adequate volume Performed at [place] [address] [place], [place] [address]    Culture   Final    NO GROWTH 5 DAYS Performed at [facility], [place] [address], [place], [place] [address]    Report Status 04/14/2021 FINAL  Final  Blood Culture (routine x 2)     Status: None   Collection Time: 04/09/21 10:55 AM   Specimen: BLOOD  Result Value Ref Range Status   Specimen Description   Final    BLOOD RIGHT ANTECUBITAL Performed at [place] [address] [place],  [address]    Special Requests   Final    BOTTLES DRAWN AEROBIC AND ANAEROBIC Blood Culture adequate volume Performed  at [facility], [place] [address] [place], [place] [address]    Culture   Final    NO GROWTH 5 DAYS Performed at [facility], [place] [address], [place], [place] [address]    Report Status 04/14/2021 FINAL  Final  Urine Culture     Status: None   Collection Time: 04/09/21  1:58 PM   Specimen: In/Out Cath Urine  Result Value Ref Range Status   Specimen Description   Final    IN/OUT CATH URINE Performed at [place] [address], [place], [place] [address]    Special Requests   Final    NONE Performed at [facility], [place] [address] [place], [place] [address]    Culture   Final    NO GROWTH Performed at [facility], [place] [address] [place], [place] [address]    Report Status 04/10/2021 FINAL  Final  Resp Panel by RT-PCR (Flu A&B, Covid) Nasopharyngeal Swab     Status: None   Collection Time: 04/09/21  3:22 PM   Specimen: Nasopharyngeal Swab; Nasopharyngeal(NP) swabs in vial transport medium  Result Value Ref Range Status   SARS Coronavirus 2 by RT PCR NEGATIVE NEGATIVE Final    Comment: (NOTE) SARS-CoV-2 target nucleic acids are NOT DETECTED.  The SARS-CoV-2 RNA is generally detectable in upper respiratory specimens  during the acute phase of infection. The lowest concentration of SARS-CoV-2 viral copies this assay can detect is 138 copies/mL. A negative result does not preclude SARS-Cov-2 infection and should not be used as the sole basis for treatment or other patient management decisions. A negative result may occur with  improper specimen collection/handling, submission of specimen other than nasopharyngeal swab, presence of viral mutation(s) within the areas targeted by this assay, and inadequate number of viral copies(<138 copies/mL). A negative result must be combined with clinical observations, patient history, and epidemiological information. The expected result is Negative.  Fact Sheet for Patients:  EntrepreneurPulse.com.au  Fact Sheet for Healthcare Providers:  IncredibleEmployment.be  This test is no t yet approved or cleared by the [place] FDA and  has been authorized for detection and/or diagnosis of SARS-CoV-2 by FDA under an Emergency Use Authorization (EUA). This EUA will remain  in effect (meaning this test can be used) for the duration of the COVID-19 declaration under Section 564(b)(1) of the Act, 21 U.S.C.section 360bbb-3(b)(1), unless the authorization is terminated  or revoked sooner.       Influenza A by PCR NEGATIVE NEGATIVE Final   Influenza B by PCR NEGATIVE NEGATIVE Final    Comment: (NOTE) The Xpert Xpress SARS-CoV-2/FLU/RSV plus assay is intended as an aid in the diagnosis of influenza from Nasopharyngeal swab specimens and should not be used as a sole basis for treatment. Nasal washings and aspirates are unacceptable for Xpert Xpress SARS-CoV-2/FLU/RSV testing.  Fact Sheet for Patients: EntrepreneurPulse.com.au  Fact Sheet for Healthcare Providers: IncredibleEmployment.be  This test is not yet approved or cleared by the [place] FDA and has been authorized for detection  and/or diagnosis of SARS-CoV-2 by FDA under an Emergency Use Authorization (EUA). This EUA will remain in effect (meaning this test can be used) for the duration of the COVID-19 declaration under Section 564(b)(1) of the Act, 21 U.S.C. section 360bbb-3(b)(1), unless the authorization is terminated or revoked.  Performed at [facility], [place] [address] [place],  [address]          Radiology Studies: No  results found.      Scheduled Meds:  busPIRone  10 mg Oral TID   memantine  28 mg Oral Daily   metoprolol succinate  25 mg Oral Daily   pantoprazole  40 mg Oral BID   potassium chloride  40 mEq Oral Daily   QUEtiapine  25 mg Oral QHS   sertraline  75 mg Oral q AM   torsemide  20 mg Oral Daily   Continuous Infusions:  sodium chloride 50 mL/hr at 04/15/21 1015     LOS: 5 days    Time spent: 35 minutes.     Elmarie Shiley, MD [facility]   If 7PM-7AM, please contact night-coverage [email or web address]  04/15/2021, 3:28 PM

## 2021-04-15 NOTE — Plan of Care (Signed)
  Problem: Pain Managment: Goal: General experience of comfort will improve Outcome: Progressing   Problem: Skin Integrity: Goal: Risk for impaired skin integrity will decrease Outcome: Progressing   

## 2021-04-15 NOTE — TOC Progression Note (Signed)
Transition of Care [facility]) - Progression Note    Patient Details  Name: Jasmine Jones MRN: [id number] Date of Birth: 1926/01/22  [facility] CM/SW [place], [place] Phone Number: 04/15/2021, 3:20 PM  Clinical Narrative:   Spoke with caregiver [name].  Patient awoke briefly, made a non-sequiter comment, and went back to sleep. [name] has been working with patient for 8 years-generally 8 hours a day. Prior to hospitalization, patient was ambulatory with assist, and had moments of confusion, but is much worse now.  She will continue to work with patient in whatever setting she goes to per family wishes.  I called [name], niece, who plans to come to see patient tomorrow afternoon.  I will meet with her then, and hopefully we will have had another PT session by then for assessment sake. Will need to have a decision at the end of that meeting about dispositional plan. TOC will continue to follow during the course of hospitalization.      Expected Discharge Plan:  (To be determined) Barriers to Discharge: Continued Medical Work up  Expected Discharge Plan and Services Expected Discharge Plan:  (To be determined)                                               Social Determinants of Health (SDOH) Interventions    Readmission Risk Interventions No flowsheet data found.

## 2021-04-16 LAB — BASIC METABOLIC PANEL
Anion gap: 8 (ref 5–15)
BUN: 15 mg/dL (ref 8–23)
CO2: 27 mmol/L (ref 22–32)
Calcium: 8.5 mg/dL — ABNORMAL LOW (ref 8.9–10.3)
Chloride: 102 mmol/L (ref 98–111)
Creatinine, Ser: 0.6 mg/dL (ref 0.44–1.00)
GFR, Estimated: >60 mL/min (ref >60)
Glucose, Bld: 107 mg/dL — ABNORMAL HIGH (ref 70–99)
Potassium: 3.6 mmol/L (ref 3.5–5.1)
Sodium: 137 mmol/L (ref 135–145)

## 2021-04-16 LAB — CBC
HCT: 39.1 % (ref 36.0–46.0)
Hemoglobin: 12.3 g/dL (ref 12.0–15.0)
MCH: 25.7 pg — ABNORMAL LOW (ref 26.0–34.0)
MCHC: 31.5 g/dL (ref 30.0–36.0)
MCV: 81.8 fL (ref 80.0–100.0)
Platelets: 173 10*3/uL (ref 150–400)
RBC: 4.78 MIL/uL (ref 3.87–5.11)
RDW: 14.8 % (ref 11.5–15.5)
WBC: 9 10*3/uL (ref 4.0–10.5)
nRBC: 0 % (ref 0.0–0.2)

## 2021-04-16 LAB — GLUCOSE, CAPILLARY: Glucose-Capillary: 146 mg/dL — ABNORMAL HIGH (ref 70–99)

## 2021-04-16 MED ORDER — LIP MEDEX EX OINT
TOPICAL_OINTMENT | CUTANEOUS | Status: DC | PRN
  Filled 2021-04-16: qty 7

## 2021-04-16 NOTE — Progress Notes (Signed)
SLP Cancellation Note  Patient Details Name: Jasmine Jones MRN: [id number] DOB: 1925-06-14   Cancelled treatment:       Reason Eval/Treat Not Completed: Other (comment) (will defer follow up until palliative meets with family in person today)  Kathleen Lime, [place] [facility] SLP [place] Office [phone] Cell [phone]  Macario Golds 04/16/2021, 10:17 AM

## 2021-04-16 NOTE — Progress Notes (Signed)
PROGRESS NOTE    Jasmine Jones  VWU:[id number] DOB: [birth year] DOA: 04/09/2021 PCP: [name], NP   Brief Narrative: 88 year old with past medical history significant for anxiety, dementia, GERD, A-fib, hypertension presented to the ED with generalized weakness, febrile illness.  Patient was found to be confused, seems to move slower  than her usual self.  Her temperature was checked and it was at 102.  Patient also had a fall a week prior to admission.   Patient was admitted with fever of unknown origin.  She has remained afebrile.  Lactic acidosis has resolved.  She completed IV antibiotics for 5 days.  Assessment & Plan:   Principal Problem:   Fever of unknown origin Active Problems:   Essential hypertension   ATRIAL FIBRILLATION   Dementia (HCC)   Hypokalemia  1-Fever ;  Probably related to pneumonia. Lactic acidosis; resolved.  Patient presented with fever, no leukocytosis, UA and chest x-ray unremarkable. Influenza negative, COVID-negative. CT: Irregular and consolidative density in bilateral lung bases most likely representing possible pneumonia. Sepsis ruled out Completed 5 days of cefepime and Flagyl. Concern For aspiration pneumonia component Afebrile.   2-Hypokalemia: Replaced  3-Right hip pain status post fall: Denies pain right hip. CT show posttraumatic deformity of proximal right femur status post ORIF for an intertrochanteric fracture. Discussed with Dr. Ninfa Linden who reviewed the CT, finding consistent with chronic changes.  Pain management.   4-Dementia: Continue with Namenda and Seroquel  Dysphagia;  Patient at times hold food in mouths, other time has regurgitation of food.  She is currently on dysphagia 1 diet.  Continue with  PPI BID  Recommend small frequent meals.   Elevated troponins: Flat elevation, may be related to demand ischemia.  Hypertension: Continue with metoprolol  Paroxysmal A-fib: Continue with  metoprolol  Chronic bilateral lower extremity edema: Will hold torsemide due to poor oral intake.   Estimated body mass index is 25.65 kg/m as calculated from the following:   Height as of 03/29/21: 5\' 3"  (1.6 m).   Weight as of 03/29/21: 65.7 kg.   DVT prophylaxis: Lovenox Code Status: DNR Family Communication: Caregiver at bedside Disposition Plan:  Status is: Inpatient Remains inpatient appropriate because: delirium, awaiting disposition.             Consultants:  Palliative  Procedures:   Antimicrobials:    Subjective: She is alert today., hard of hearing. Per caregiver patient regurgitated less .  Patient denies pain, only when she walks.   Objective: Vitals:   04/15/21 0408 04/15/21 1442 04/15/21 2044 04/16/21 0441  BP: 138/75 117/67 131/66 133/72  Pulse: 73 67 74 72  Resp: 18 20 16 13   Temp: 98.7 F (37.1 C) 98.8 F (37.1 C) 97.9 F (36.6 C) 98.9 F (37.2 C)  TempSrc: Axillary Oral    SpO2: 96% 93% 95% 98%    Intake/Output Summary (Last 24 hours) at 04/16/2021 1436 Last data filed at 04/16/2021 0948 Gross per 24 hour  Intake 236 ml  Output 1350 ml  Net -1114 ml    There were no vitals filed for this visit.  Examination:  General exam: NAD Respiratory system:  CTA Cardiovascular system: S 1, S 2 RRR Gastrointestinal system: BS present, soft, nt Central nervous system: alert today.  Extremities: trace edema   Data Reviewed: I have personally reviewed following labs and imaging studies  CBC: Recent Labs  Lab 04/10/21 0708 04/13/21 0523 04/16/21 0847  WBC 8.0 7.9 9.0  HGB 11.0* 10.9* 12.3  HCT 35.1* 34.3* 39.1  MCV 83.0 82.5 81.8  PLT 153 141* 330    Basic Metabolic Panel: Recent Labs  Lab 04/09/21 1748 04/10/21 0708 04/12/21 1018 04/13/21 0523 04/15/21 0452 04/16/21 0847  NA  --  139 137 139 134* 137  K  --  3.0* 3.3* 3.1* 3.6 3.6  CL  --  105 104 105 102 102  CO2  --  27 25 27 24 27   GLUCOSE  --  91 137* 97 94  107*  BUN  --  15 14 12 13 15   CREATININE  --  0.59 0.65 0.57 0.44 0.60  CALCIUM  --  8.1* 7.8* 7.8* 7.9* 8.5*  MG 2.0  --   --   --   --   --     GFR: CrCl cannot be calculated (Unknown ideal weight.). Liver Function Tests: Recent Labs  Lab 04/10/21 0708  AST 16  ALT 12  ALKPHOS 76  BILITOT 1.2  PROT 5.3*  ALBUMIN 3.1*    No results for input(s): LIPASE, AMYLASE in the last 168 hours.  No results for input(s): AMMONIA in the last 168 hours. Coagulation Profile: Recent Labs  Lab 04/10/21 0708  INR 1.1    Cardiac Enzymes: No results for input(s): CKTOTAL, CKMB, CKMBINDEX, TROPONINI in the last 168 hours. BNP (last 3 results) No results for input(s): PROBNP in the last 8760 hours. HbA1C: No results for input(s): HGBA1C in the last 72 hours. CBG: Recent Labs  Lab 04/16/21 0112  GLUCAP 146*   Lipid Profile: No results for input(s): CHOL, HDL, LDLCALC, TRIG, CHOLHDL, LDLDIRECT in the last 72 hours. Thyroid Function Tests: No results for input(s): TSH, T4TOTAL, FREET4, T3FREE, THYROIDAB in the last 72 hours. Anemia Panel: No results for input(s): VITAMINB12, FOLATE, FERRITIN, TIBC, IRON, RETICCTPCT in the last 72 hours. Sepsis Labs: Recent Labs  Lab 04/09/21 1748 04/09/21 2358 04/10/21 0708  PROCALCITON  --   --  <0.10  LATICACIDVEN 1.2 1.2  --      Recent Results (from the past 240 hour(s))  Blood Culture (routine x 2)     Status: None   Collection Time: 04/09/21 10:53 AM   Specimen: BLOOD  Result Value Ref Range Status   Specimen Description   Final    BLOOD BLOOD LEFT FOREARM Performed at [facility] [address], [place], [place] [address]    Special Requests   Final    BOTTLES DRAWN AEROBIC AND ANAEROBIC Blood Culture adequate volume Performed at [facility] [address] [place], [place] [address]    Culture   Final    NO GROWTH 5 DAYS Performed at [facility], [place] [address],  [place], [place] [address]    Report Status 04/14/2021 FINAL  Final  Blood Culture (routine x 2)     Status: None   Collection Time: 04/09/21 10:55 AM   Specimen: BLOOD  Result Value Ref Range Status   Specimen Description   Final    BLOOD RIGHT ANTECUBITAL Performed at [facility] [address], [place], [place] [address]    Special Requests   Final    BOTTLES DRAWN AEROBIC AND ANAEROBIC Blood Culture adequate volume Performed at [facility] [address] [place], [place] [address]    Culture   Final    NO GROWTH 5 DAYS Performed at [facility], [place] [address], [place], [place] [address]    Report Status  04/14/2021 FINAL  Final  Urine Culture     Status: None   Collection Time: 04/09/21  1:58 PM   Specimen: In/Out Cath Urine  Result Value Ref Range Status   Specimen Description   Final    IN/OUT CATH URINE Performed at [facility] [address], [place], [place] [address]    Special Requests   Final    NONE Performed at [facility], [place] [address] [place], [place] [address]    Culture   Final    NO GROWTH Performed at [facility], [place] [address], [place],  [address]    Report Status 04/10/2021 FINAL  Final  Resp Panel by RT-PCR (Flu A&B, Covid) Nasopharyngeal Swab     Status: None   Collection Time: 04/09/21  3:22 PM   Specimen: Nasopharyngeal Swab; Nasopharyngeal(NP) swabs in vial transport medium  Result Value Ref Range Status   SARS Coronavirus 2 by RT PCR NEGATIVE NEGATIVE Final    Comment: (NOTE) SARS-CoV-2 target nucleic acids are NOT DETECTED.  The SARS-CoV-2 RNA is generally detectable in upper respiratory specimens during the acute phase of infection. The lowest concentration of SARS-CoV-2 viral copies this assay can detect is 138 copies/mL. A negative result does not preclude SARS-Cov-2 infection and should not be used as the sole basis for treatment  or other patient management decisions. A negative result may occur with  improper specimen collection/handling, submission of specimen other than nasopharyngeal swab, presence of viral mutation(s) within the areas targeted by this assay, and inadequate number of viral copies(<138 copies/mL). A negative result must be combined with clinical observations, patient history, and epidemiological information. The expected result is Negative.  Fact Sheet for Patients:  EntrepreneurPulse.com.au  Fact Sheet for Healthcare Providers:  IncredibleEmployment.be  This test is no t yet approved or cleared by the [place] FDA and  has been authorized for detection and/or diagnosis of SARS-CoV-2 by FDA under an Emergency Use Authorization (EUA). This EUA will remain  in effect (meaning this test can be used) for the duration of the COVID-19 declaration under Section 564(b)(1) of the Act, 21 U.S.C.section 360bbb-3(b)(1), unless the authorization is terminated  or revoked sooner.       Influenza A by PCR NEGATIVE NEGATIVE Final   Influenza B by PCR NEGATIVE NEGATIVE Final    Comment: (NOTE) The Xpert Xpress SARS-CoV-2/FLU/RSV plus assay is intended as an aid in the diagnosis of influenza from Nasopharyngeal swab specimens and should not be used as a sole basis for treatment. Nasal washings and aspirates are unacceptable for Xpert Xpress SARS-CoV-2/FLU/RSV testing.  Fact Sheet for Patients: EntrepreneurPulse.com.au  Fact Sheet for Healthcare Providers: IncredibleEmployment.be  This test is not yet approved or cleared by the [place] FDA and has been authorized for detection and/or diagnosis of SARS-CoV-2 by FDA under an Emergency Use Authorization (EUA). This EUA will remain in effect (meaning this test can be used) for the duration of the COVID-19 declaration under Section 564(b)(1) of the Act, 21 U.S.C. section  360bbb-3(b)(1), unless the authorization is terminated or revoked.  Performed at [facility], [place] [address], [place],  [address]           Radiology Studies: No results found.      Scheduled Meds:  busPIRone  10 mg Oral TID   enoxaparin (LOVENOX) injection  40 mg Subcutaneous Q24H   memantine  28 mg Oral Daily   metoprolol succinate  25 mg Oral Daily  pantoprazole  40 mg Oral BID   potassium chloride  40 mEq Oral Daily   QUEtiapine  25 mg Oral QHS   sertraline  75 mg Oral q AM   Continuous Infusions:     LOS: 6 days    Time spent: 35 minutes.     Elmarie Shiley, MD [facility]   If 7PM-7AM, please contact night-coverage [email or web address]  04/16/2021, 2:36 PM

## 2021-04-16 NOTE — Care Management Important Message (Signed)
Important Message  Patient Details IM Letter placed in Patients room. Name: Jasmine Jones MRN: [id number] Date of Birth: 12-Jul-1925   Medicare Important Message Given:  Yes     Kerin Salen 04/16/2021, 10:45 AM

## 2021-04-16 NOTE — Progress Notes (Signed)
Physical Therapy Treatment Patient Details Name: Jasmine Jones MRN: [id number] DOB: 1925-03-28 Today's Date: 04/16/2021   History of Present Illness This 89 years old female presetned to teh ED with increased confusion, weakness and febrile. Caregiver resports that the patient was found to be confused, she seems to move slower than her usual self and getting about her day.  She was also having difficulty walking and  for some reason they decided to check her temperature it was found to be 102.4.  Patient also reported she fell in the bathroom last night and complains of right hip pain She is unable to tell the mechanism of the fall. The caregiver at bedside reported she fell last week and that she is often confused about timelines for certain events. PMH significant for anxiety, dementia, GERD, A-fib, hypertension    PT Comments    Pt requiring max-total assist (+2) for bed mobility and unable to stand without significant assist.  Pt also with posterior lean with standing and unable to remain in standing longer then 15 seconds.  Pt may benefit from SNF upon d/c or if home, will needs significant physical assist if OOB.  Palliative meeting and [place] pending for later today.    Recommendations for follow up therapy are one component of a multi-disciplinary discharge planning process, led by the attending physician.  Recommendations may be updated based on patient status, additional functional criteria and insurance authorization.  Follow Up Recommendations  Skilled nursing-short term rehab (<3 hours/day)     Assistance Recommended at Discharge Frequent or constant Supervision/Assistance  Patient can return home with the following Two people to help with walking and/or transfers;Assistance with cooking/housework;Assistance with feeding;Direct supervision/assist for medications management;Assist for transportation;Help with stairs or ramp for entrance;Two people to help with  bathing/dressing/bathroom   Equipment Recommendations  None recommended by PT    Recommendations for Other Services       Precautions / Restrictions Precautions Precautions: Fall     Mobility  Bed Mobility Overal bed mobility: Needs Assistance Bed Mobility: Supine to Sit, Sit to Supine     Supine to sit: Max assist, HOB elevated, +2 for physical assistance Sit to supine: Max assist, +2 for physical assistance   General bed mobility comments: Max assist with significant extra time to process cues.  pt intiates however has difficulty performing requiring assist; pt required assist to bring LE's off EOB and to raise trunk upright. 2+ max to return to supine and boost up in bed.    Transfers Overall transfer level: Needs assistance Equipment used: Rolling walker (2 wheels) Transfers: Sit to/from Stand Sit to Stand: Max assist, +2 physical assistance           General transfer comment: assist to rise and steady, pt tends to have posterior bias and state "I'm falling" despite corrective cues; performed twice, pt unable to hold standing longer then 15 seconds; pt with BM upon standing, attempted to perform pericare however pt continually going (not safe to get to [facility] at this time so returned to supine and nurse tech notified)    Ambulation/Gait                   Stairs             Wheelchair Mobility    Modified Rankin (Stroke Patients Only)       Balance Overall balance assessment: Needs assistance Sitting-balance support: Feet supported, Bilateral upper extremity supported Sitting balance-Leahy Scale: Poor Sitting balance - Comments: requires  cues and assist to get to EOB, once EOB, reliant on UE support for balance                                    Cognition Arousal/Alertness: Awake/alert Behavior During Therapy: Flat affect Overall Cognitive Status: History of cognitive impairments - at baseline                                  General Comments: pt cooperative today however can be agitated some days per caregiver, very [facility]        Exercises      General Comments        Pertinent Vitals/Pain Pain Assessment Pain Assessment: Faces Faces Pain Scale: Hurts a little bit Pain Location: generalized Pain Descriptors / Indicators: Tender Pain Intervention(s): Repositioned, Monitored during session    Home Living                          Prior Function            PT Goals (current goals can now be found in the care plan section) Progress towards PT goals: Progressing toward goals    Frequency    Min 2X/week      PT Plan Current plan remains appropriate    Co-evaluation              AM-PAC PT "6 Clicks" Mobility   Outcome Measure  Help needed turning from your back to your side while in a flat bed without using bedrails?: A Lot Help needed moving from lying on your back to sitting on the side of a flat bed without using bedrails?: Total Help needed moving to and from a bed to a chair (including a wheelchair)?: Total Help needed standing up from a chair using your arms (e.g., wheelchair or bedside chair)?: Total Help needed to walk in hospital room?: Total Help needed climbing 3-5 steps with a railing? : Total 6 Click Score: 7    End of Session Equipment Utilized During Treatment: Gait belt Activity Tolerance: Patient limited by pain Patient left: in bed;with bed alarm set;with call bell/phone within reach;with family/visitor present Nurse Communication: Mobility status PT Visit Diagnosis: Other abnormalities of gait and mobility (R26.89);Muscle weakness (generalized) (M62.81);Repeated falls (R29.6)     Time: 2423-5361 PT Time Calculation (min) (ACUTE ONLY): 18 min  Charges:  $Therapeutic Activity: 8-22 mins                     Jannette Spanner PT, DPT Acute Rehabilitation Services Pager: [phone] Office: [place] 04/16/2021, 12:07  PM

## 2021-04-16 NOTE — TOC Progression Note (Addendum)
Transition of Care [facility]) - Progression Note    Patient Details  Name: Jasmine Jones MRN: [id number] Date of Birth: Jun 12, 1925  Transition of Care [facility]) CM/SW [place], [place] Phone Number: 04/16/2021, 3:06 PM  Clinical Narrative:   Met with Ms Jasmine Liberty, Ms Jasmine Jones. Ms Jasmine Jones was included remotely.  Family is unhappy with bed offers for SNF.  They believe she would be better off returning to her room at [facility] and working with PT there, as well as palliative team with [place] while trajectory for recovery is determined.  Ms Jasmine Jones would continue to be involved approximately 8 hours a day for care, and family is willing to pay for more if needed.  Left message for Ms Jasmine Jones at [facility] to find out if they are willing to consider taking [name] back. TOC will continue to follow during the course of hospitalization.  Ms Jasmine Jones states [facility] is willing to consider taking her back.  They would need d/c summary and FL2 with d/c meds so they can make sure there are no surprises, and talk to family as well.  Family is now saying they think she needs higher level of care for closer supervision, and are checking into nursing homes for LTC.  I let them know that patient is stable for d/c, and we would be planning on moving her on Monday if [facility] says yes.     Expected Discharge Plan: Assisted Living Barriers to Discharge: Other (must enter comment) (return to [employer] pending their approval)  Expected Discharge Plan and Services Expected Discharge Plan: Assisted Living                                               Social Determinants of Health (SDOH) Interventions    Readmission Risk Interventions No flowsheet data found.

## 2021-04-17 NOTE — Progress Notes (Signed)
PROGRESS NOTE    Jasmine Jones  HWE:[id number] DOB: 1925/08/22 DOA: 04/09/2021 PCP: [name], NP   Brief Narrative: 88 year old with past medical history significant for anxiety, dementia, GERD, A-fib, hypertension presented to the ED with generalized weakness, febrile illness.  Patient was found to be confused, seems to move slower  than her usual self.  Her temperature was checked and it was at 102.  Patient also had a fall a week prior to admission.   Patient was admitted with fever of unknown origin.  She has remained afebrile.  Lactic acidosis has resolved.  She completed IV antibiotics for 5 days.  Assessment & Plan:   Principal Problem:   Fever of unknown origin Active Problems:   Essential hypertension   ATRIAL FIBRILLATION   Dementia (HCC)   Hypokalemia  1-Fever;  Probably related to pneumonia. Lactic acidosis; resolved.  Patient presented with fever, no leukocytosis, UA and chest x-ray unremarkable. Influenza negative, COVID-negative. CT: Irregular and consolidative density in bilateral lung bases most likely representing possible pneumonia. Sepsis ruled out Completed 5 days of cefepime and Flagyl. Concern For aspiration pneumonia component Afebrile.   2-Hypokalemia: Replaced  3-Right hip pain status post fall: Denies pain right hip. CT show posttraumatic deformity of proximal right femur status post ORIF for an intertrochanteric fracture. Discussed with Dr. Ninfa Linden who reviewed the CT, finding consistent with chronic changes.  Pain management.   4-Dementia: Continue with Namenda and Seroquel  Dysphagia;  Patient at times hold food in mouths, other time has regurgitation of food.  She is currently on dysphagia 1 diet.  Continue with  PPI BID  Recommend small frequent meals.  At risk for aspiration.   Elevated troponins: Flat elevation, may be related to demand ischemia.  Hypertension: Continue with metoprolol  Paroxysmal A-fib:  Continue with metoprolol  Chronic bilateral lower extremity edema: Will hold torsemide due to poor oral intake.   Estimated body mass index is 25.65 kg/m as calculated from the following:   Height as of 03/29/21: 5\' 3"  (1.6 m).   Weight as of 03/29/21: 65.7 kg.   DVT prophylaxis: Lovenox Code Status: DNR Family Communication: Caregiver at bedside Disposition Plan:  Status is: Inpatient Remains inpatient appropriate because: delirium, awaiting disposition; plan for ALF vs SNF for long term care.             Consultants:  Palliative  Procedures:   Antimicrobials:    Subjective: She is sleepy today, received ativan earlier this am.   Objective: Vitals:   04/15/21 2044 04/16/21 0441 04/16/21 2009 04/17/21 0401  BP: 131/66 133/72 (!) 147/77 (!) 124/57  Pulse: 74 72 76 83  Resp: 16 13 20 18   Temp: 97.9 F (36.6 C) 98.9 F (37.2 C) 97.7 F (36.5 C) (!) 97.5 F (36.4 C)  TempSrc:   Oral Oral  SpO2: 95% 98% 100% 97%    Intake/Output Summary (Last 24 hours) at 04/17/2021 1204 Last data filed at 04/17/2021 1100 Gross per 24 hour  Intake 120 ml  Output 501 ml  Net -381 ml    There were no vitals filed for this visit.  Examination:  General exam: NAD Respiratory system:  CTA Cardiovascular system: S 1, S 2 RRR Gastrointestinal system: BS present, soft, nt Central nervous system: sleepy, say few words.  Extremities: Trace edema.    Data Reviewed: I have personally reviewed following labs and imaging studies  CBC: Recent Labs  Lab 04/13/21 0523 04/16/21 0847  WBC 7.9 9.0  HGB  10.9* 12.3  HCT 34.3* 39.1  MCV 82.5 81.8  PLT 141* 696    Basic Metabolic Panel: Recent Labs  Lab 04/12/21 1018 04/13/21 0523 04/15/21 0452 04/16/21 0847  NA 137 139 134* 137  K 3.3* 3.1* 3.6 3.6  CL 104 105 102 102  CO2 25 27 24 27   GLUCOSE 137* 97 94 107*  BUN 14 12 13 15   CREATININE 0.65 0.57 0.44 0.60  CALCIUM 7.8* 7.8* 7.9* 8.5*    GFR: CrCl cannot be  calculated (Unknown ideal weight.). Liver Function Tests: No results for input(s): AST, ALT, ALKPHOS, BILITOT, PROT, ALBUMIN in the last 168 hours.  No results for input(s): LIPASE, AMYLASE in the last 168 hours.  No results for input(s): AMMONIA in the last 168 hours. Coagulation Profile: No results for input(s): INR, PROTIME in the last 168 hours.  Cardiac Enzymes: No results for input(s): CKTOTAL, CKMB, CKMBINDEX, TROPONINI in the last 168 hours. BNP (last 3 results) No results for input(s): PROBNP in the last 8760 hours. HbA1C: No results for input(s): HGBA1C in the last 72 hours. CBG: Recent Labs  Lab 04/16/21 0112  GLUCAP 146*    Lipid Profile: No results for input(s): CHOL, HDL, LDLCALC, TRIG, CHOLHDL, LDLDIRECT in the last 72 hours. Thyroid Function Tests: No results for input(s): TSH, T4TOTAL, FREET4, T3FREE, THYROIDAB in the last 72 hours. Anemia Panel: No results for input(s): VITAMINB12, FOLATE, FERRITIN, TIBC, IRON, RETICCTPCT in the last 72 hours. Sepsis Labs: No results for input(s): PROCALCITON, LATICACIDVEN in the last 168 hours.   Recent Results (from the past 240 hour(s))  Blood Culture (routine x 2)     Status: None   Collection Time: 04/09/21 10:53 AM   Specimen: BLOOD  Result Value Ref Range Status   Specimen Description   Final    BLOOD BLOOD LEFT FOREARM Performed at [facility] [address], [place], [place] [address]    Special Requests   Final    BOTTLES DRAWN AEROBIC AND ANAEROBIC Blood Culture adequate volume Performed at [facility] [address] [place], [place] [address]    Culture   Final    NO GROWTH 5 DAYS Performed at [facility], [place] [address], [place], [place] [address]    Report Status 04/14/2021 FINAL  Final  Blood Culture (routine x 2)     Status: None   Collection Time: 04/09/21 10:55 AM   Specimen: BLOOD  Result Value Ref Range Status   Specimen Description   Final     BLOOD RIGHT ANTECUBITAL Performed at [facility] [address] [place], [place] [address]    Special Requests   Final    BOTTLES DRAWN AEROBIC AND ANAEROBIC Blood Culture adequate volume Performed at [facility] [address] [place], [place] [address]    Culture   Final    NO GROWTH 5 DAYS Performed at [facility], [place] [address] [place], [place] [address]    Report Status 04/14/2021 FINAL  Final  Urine Culture     Status: None   Collection Time: 04/09/21  1:58 PM   Specimen: In/Out Cath Urine  Result Value Ref Range Status   Specimen Description   Final    IN/OUT CATH URINE Performed at [facility] [address], [place], [place] [address]    Special Requests   Final    NONE Performed at [facility], [place] [address]  [place], [place] [address]    Culture   Final    NO GROWTH Performed at [facility], [place] [address] [place], [place] [address]    Report Status 04/10/2021 FINAL  Final  Resp Panel by RT-PCR (Flu A&B, Covid) Nasopharyngeal Swab     Status: None   Collection Time: 04/09/21  3:22 PM   Specimen: Nasopharyngeal Swab; Nasopharyngeal(NP) swabs in vial transport medium  Result Value Ref Range Status   SARS Coronavirus 2 by RT PCR NEGATIVE NEGATIVE Final    Comment: (NOTE) SARS-CoV-2 target nucleic acids are NOT DETECTED.  The SARS-CoV-2 RNA is generally detectable in upper respiratory specimens during the acute phase of infection. The lowest concentration of SARS-CoV-2 viral copies this assay can detect is 138 copies/mL. A negative result does not preclude SARS-Cov-2 infection and should not be used as the sole basis for treatment or other patient management decisions. A negative result may occur with  improper specimen collection/handling, submission of specimen other than nasopharyngeal swab, presence of viral mutation(s) within the areas targeted by this  assay, and inadequate number of viral copies(<138 copies/mL). A negative result must be combined with clinical observations, patient history, and epidemiological information. The expected result is Negative.  Fact Sheet for Patients:  EntrepreneurPulse.com.au  Fact Sheet for Healthcare Providers:  IncredibleEmployment.be  This test is no t yet approved or cleared by the [place] FDA and  has been authorized for detection and/or diagnosis of SARS-CoV-2 by FDA under an Emergency Use Authorization (EUA). This EUA will remain  in effect (meaning this test can be used) for the duration of the COVID-19 declaration under Section 564(b)(1) of the Act, 21 U.S.C.section 360bbb-3(b)(1), unless the authorization is terminated  or revoked sooner.       Influenza A by PCR NEGATIVE NEGATIVE Final   Influenza B by PCR NEGATIVE NEGATIVE Final    Comment: (NOTE) The Xpert Xpress SARS-CoV-2/FLU/RSV plus assay is intended as an aid in the diagnosis of influenza from Nasopharyngeal swab specimens and should not be used as a sole basis for treatment. Nasal washings and aspirates are unacceptable for Xpert Xpress SARS-CoV-2/FLU/RSV testing.  Fact Sheet for Patients: EntrepreneurPulse.com.au  Fact Sheet for Healthcare Providers: IncredibleEmployment.be  This test is not yet approved or cleared by the [place] FDA and has been authorized for detection and/or diagnosis of SARS-CoV-2 by FDA under an Emergency Use Authorization (EUA). This EUA will remain in effect (meaning this test can be used) for the duration of the COVID-19 declaration under Section 564(b)(1) of the Act, 21 U.S.C. section 360bbb-3(b)(1), unless the authorization is terminated or revoked.  Performed at [facility], [name] [address] [place], [place] [address]           Radiology Studies: No results  found.      Scheduled Meds:  busPIRone  10 mg Oral TID   enoxaparin (LOVENOX) injection  40 mg Subcutaneous Q24H   memantine  28 mg Oral Daily   metoprolol succinate  25 mg Oral Daily   pantoprazole  40 mg Oral BID   potassium chloride  40 mEq Oral Daily   QUEtiapine  25 mg Oral QHS   sertraline  75 mg Oral q AM   Continuous Infusions:     LOS: 7 days    Time spent: 35 minutes.     Elmarie Shiley, MD [facility]   If 7PM-7AM, please contact night-coverage [email or web address]  04/17/2021, 12:04 PM

## 2021-04-17 NOTE — TOC Progression Note (Signed)
Transition of Care [facility]) - Progression Note    Patient Details  Name: Jasmine Jones MRN: [id number] Date of Birth: 1925-03-15  Transition of Care [facility] CM/SW [place], [place] Phone Number: 04/17/2021, 11:36 AM  Clinical Narrative:   Per MD request, re-sent info to [place] re: request for out-of-pocket payment for LTC. TOC will continue to follow during the course of hospitalization.     Expected Discharge Plan: Assisted Living Barriers to Discharge: Other (must enter comment) (return to [employer] pending their approval)  Expected Discharge Plan and Services Expected Discharge Plan: Assisted Living                                               Social Determinants of Health (SDOH) Interventions    Readmission Risk Interventions No flowsheet data found.

## 2021-04-17 NOTE — Plan of Care (Signed)
Pt requiring Ativan this shift for agitation while cleaning and changing her bed. Pt resting quietly at this time. Problem: Pain Managment: Goal: General experience of comfort will improve Outcome: Progressing   Problem: Skin Integrity: Goal: Risk for impaired skin integrity will decrease Outcome: Progressing

## 2021-04-18 LAB — BASIC METABOLIC PANEL
Anion gap: 6 (ref 5–15)
BUN: 13 mg/dL (ref 8–23)
CO2: 28 mmol/L (ref 22–32)
Calcium: 8.5 mg/dL — ABNORMAL LOW (ref 8.9–10.3)
Chloride: 103 mmol/L (ref 98–111)
Creatinine, Ser: 0.47 mg/dL (ref 0.44–1.00)
GFR, Estimated: >60 mL/min (ref >60)
Glucose, Bld: 91 mg/dL (ref 70–99)
Potassium: 4.2 mmol/L (ref 3.5–5.1)
Sodium: 137 mmol/L (ref 135–145)

## 2021-04-18 MED ORDER — HALOPERIDOL LACTATE 5 MG/ML IJ SOLN: 0.5000 mg | Freq: Four times a day (QID) | INTRAMUSCULAR | Status: DC | PRN

## 2021-04-18 NOTE — Progress Notes (Signed)
When turning pt to provide personal hygiene care- Pt developed a skin tear to the Left posterior lower leg (calf) area that was previously bruised. Foam drsg applied

## 2021-04-18 NOTE — Progress Notes (Signed)
PROGRESS NOTE    Jasmine Jones  FGH:[id number] DOB: [birth year] DOA: 04/09/2021 PCP: [name], NP   Brief Narrative: 88 year old with past medical history significant for anxiety, dementia, GERD, A-fib, hypertension presented to the ED with generalized weakness, febrile illness.  Patient was found to be confused, seems to move slower  than her usual self.  Her temperature was checked and it was at 102.  Patient also had a fall a week prior to admission.   Patient was admitted with fever of unknown origin.  She has remained afebrile.  Lactic acidosis has resolved.  She completed IV antibiotics for 5 days.  Assessment & Plan:   Principal Problem:   Fever of unknown origin Active Problems:   Essential hypertension   ATRIAL FIBRILLATION   Dementia (HCC)   Hypokalemia  1-Fever;  Probably related to pneumonia. Lactic acidosis; resolved.  Patient presented with fever, no leukocytosis, UA and chest x-ray unremarkable. Influenza negative, COVID-negative. CT: Irregular and consolidative density in bilateral lung bases most likely representing possible pneumonia. Sepsis ruled out Completed 5 days of cefepime and Flagyl. Concern For aspiration pneumonia component Afebrile.   2-Hypokalemia: Replaced  3-Right hip pain status post fall: Denies pain right hip. CT show posttraumatic deformity of proximal right femur status post ORIF for an intertrochanteric fracture. Discussed with Dr. Ninfa Linden who reviewed the CT, finding consistent with chronic changes.  Pain management.   4-Acute metabolic encephalopathy, Delirium  Dementia: Continue with Namenda and Seroquel Patient sleepy, at times agitated requiring ativan.  She is sleepy this am, suspect related to ativan.    Dysphagia;  Patient at times hold food in mouths, other time has regurgitation of food.  She is currently on dysphagia 1 diet.  Continue with  PPI BID  Recommend small frequent meals.  At risk for  aspiration.   Elevated troponins: Flat elevation, may be related to demand ischemia.  Hypertension: Continue with metoprolol  Paroxysmal A-fib: Continue with metoprolol  Chronic bilateral lower extremity edema: Will hold torsemide due to poor oral intake.   Estimated body mass index is 25.65 kg/m as calculated from the following:   Height as of 03/29/21: 5\' 3"  (1.6 m).   Weight as of 03/29/21: 65.7 kg.   DVT prophylaxis: Lovenox Code Status: DNR Family Communication: Caregiver at bedside Disposition Plan:  Status is: Inpatient Remains inpatient appropriate because: delirium, awaiting disposition; plan for ALF vs SNF for long term care.             Consultants:  Palliative  Procedures:   Antimicrobials:    Subjective: She ate a good lunch yesterday, no much dinner.  She was alert for lunch yesterday and afternoon night. Sleepy on and off.  She is sleepy today, hasn't wake up for breakfast. Received ativan again last night.  I am concern that she has night/ day schedule changed.  Per caregiver patient at times sleep during the days at her ALF, but she has been sleeping more in here.   Objective: Vitals:   04/17/21 0401 04/17/21 1326 04/17/21 2033 04/18/21 0604  BP: (!) 124/57 120/61 (!) 148/76 140/81  Pulse: 83 68 74 73  Resp: 18 16 16 18   Temp: (!) 97.5 F (36.4 C) 98.7 F (37.1 C) 98.2 F (36.8 C) (!) 97.3 F (36.3 C)  TempSrc: Oral Oral Oral Oral  SpO2: 97% 95% 93% 99%    Intake/Output Summary (Last 24 hours) at 04/18/2021 1405 Last data filed at 04/18/2021 1000 Gross per 24 hour  Intake 200 ml  Output 500 ml  Net -300 ml    There were no vitals filed for this visit.  Examination:  General exam: NAD Respiratory system:  CTA Cardiovascular system:  S,1  S 2 RRR Gastrointestinal system: BS present, soft, nt Central nervous system: sleepy  Extremities: no edema   Data Reviewed: I have personally reviewed following labs and imaging  studies  CBC: Recent Labs  Lab 04/13/21 0523 04/16/21 0847  WBC 7.9 9.0  HGB 10.9* 12.3  HCT 34.3* 39.1  MCV 82.5 81.8  PLT 141* 419    Basic Metabolic Panel: Recent Labs  Lab 04/12/21 1018 04/13/21 0523 04/15/21 0452 04/16/21 0847 04/18/21 0746  NA 137 139 134* 137 137  K 3.3* 3.1* 3.6 3.6 4.2  CL 104 105 102 102 103  CO2 25 27 24 27 28   GLUCOSE 137* 97 94 107* 91  BUN 14 12 13 15 13   CREATININE 0.65 0.57 0.44 0.60 0.47  CALCIUM 7.8* 7.8* 7.9* 8.5* 8.5*    GFR: CrCl cannot be calculated (Unknown ideal weight.). Liver Function Tests: No results for input(s): AST, ALT, ALKPHOS, BILITOT, PROT, ALBUMIN in the last 168 hours.  No results for input(s): LIPASE, AMYLASE in the last 168 hours.  No results for input(s): AMMONIA in the last 168 hours. Coagulation Profile: No results for input(s): INR, PROTIME in the last 168 hours.  Cardiac Enzymes: No results for input(s): CKTOTAL, CKMB, CKMBINDEX, TROPONINI in the last 168 hours. BNP (last 3 results) No results for input(s): PROBNP in the last 8760 hours. HbA1C: No results for input(s): HGBA1C in the last 72 hours. CBG: Recent Labs  Lab 04/16/21 0112  GLUCAP 146*    Lipid Profile: No results for input(s): CHOL, HDL, LDLCALC, TRIG, CHOLHDL, LDLDIRECT in the last 72 hours. Thyroid Function Tests: No results for input(s): TSH, T4TOTAL, FREET4, T3FREE, THYROIDAB in the last 72 hours. Anemia Panel: No results for input(s): VITAMINB12, FOLATE, FERRITIN, TIBC, IRON, RETICCTPCT in the last 72 hours. Sepsis Labs: No results for input(s): PROCALCITON, LATICACIDVEN in the last 168 hours.   Recent Results (from the past 240 hour(s))  Blood Culture (routine x 2)     Status: None   Collection Time: 04/09/21 10:53 AM   Specimen: BLOOD  Result Value Ref Range Status   Specimen Description   Final    BLOOD BLOOD LEFT FOREARM Performed at [facility] [address], [place], [place] [address]     Special Requests   Final    BOTTLES DRAWN AEROBIC AND ANAEROBIC Blood Culture adequate volume Performed at [facility] [address], [place], [place] [address]    Culture   Final    NO GROWTH 5 DAYS Performed at [facility], [place] [address] [place], [place] [address]    Report Status 04/14/2021 FINAL  Final  Blood Culture (routine x 2)     Status: None   Collection Time: 04/09/21 10:55 AM   Specimen: BLOOD  Result Value Ref Range Status   Specimen Description   Final    BLOOD RIGHT ANTECUBITAL Performed at [facility] [address] [place], [place] [address]    Special Requests   Final    BOTTLES DRAWN AEROBIC AND ANAEROBIC Blood Culture adequate volume Performed at [facility] [address] [place], [place] [address]    Culture   Final    NO GROWTH 5 DAYS Performed at [facility],  [address] [place], [place] [address]    Report Status 04/14/2021 FINAL  Final  Urine Culture     Status: None   Collection Time: 04/09/21  1:58 PM   Specimen: In/Out Cath Urine  Result Value Ref Range Status   Specimen Description   Final    IN/OUT CATH URINE Performed at [facility] [address], [place], [place] [address]    Special Requests   Final    NONE Performed at [facility], [place] [address] [place], [place] [address]    Culture   Final    NO GROWTH Performed at [facility], [place] [address], [place], [place] [address]    Report Status 04/10/2021 FINAL  Final  Resp Panel by RT-PCR (Flu A&B, Covid) Nasopharyngeal Swab     Status: None   Collection Time: 04/09/21  3:22 PM   Specimen: Nasopharyngeal Swab; Nasopharyngeal(NP) swabs in vial transport medium  Result Value Ref Range Status   SARS Coronavirus 2 by RT PCR NEGATIVE NEGATIVE Final    Comment: (NOTE) SARS-CoV-2 target nucleic acids are NOT DETECTED.  The SARS-CoV-2 RNA is generally  detectable in upper respiratory specimens during the acute phase of infection. The lowest concentration of SARS-CoV-2 viral copies this assay can detect is 138 copies/mL. A negative result does not preclude SARS-Cov-2 infection and should not be used as the sole basis for treatment or other patient management decisions. A negative result may occur with  improper specimen collection/handling, submission of specimen other than nasopharyngeal swab, presence of viral mutation(s) within the areas targeted by this assay, and inadequate number of viral copies(<138 copies/mL). A negative result must be combined with clinical observations, patient history, and epidemiological information. The expected result is Negative.  Fact Sheet for Patients:  EntrepreneurPulse.com.au  Fact Sheet for Healthcare Providers:  IncredibleEmployment.be  This test is no t yet approved or cleared by the [place] FDA and  has been authorized for detection and/or diagnosis of SARS-CoV-2 by FDA under an Emergency Use Authorization (EUA). This EUA will remain  in effect (meaning this test can be used) for the duration of the COVID-19 declaration under Section 564(b)(1) of the Act, 21 U.S.C.section 360bbb-3(b)(1), unless the authorization is terminated  or revoked sooner.       Influenza A by PCR NEGATIVE NEGATIVE Final   Influenza B by PCR NEGATIVE NEGATIVE Final    Comment: (NOTE) The Xpert Xpress SARS-CoV-2/FLU/RSV plus assay is intended as an aid in the diagnosis of influenza from Nasopharyngeal swab specimens and should not be used as a sole basis for treatment. Nasal washings and aspirates are unacceptable for Xpert Xpress SARS-CoV-2/FLU/RSV testing.  Fact Sheet for Patients: EntrepreneurPulse.com.au  Fact Sheet for Healthcare Providers: IncredibleEmployment.be  This test is not yet approved or cleared by the [place] FDA  and has been authorized for detection and/or diagnosis of SARS-CoV-2 by FDA under an Emergency Use Authorization (EUA). This EUA will remain in effect (meaning this test can be used) for the duration of the COVID-19 declaration under Section 564(b)(1) of the Act, 21 U.S.C. section 360bbb-3(b)(1), unless the authorization is terminated or revoked.  Performed at [facility], [place] [address], [place],  [address]           Radiology Studies: No results found.      Scheduled Meds:  busPIRone  10 mg Oral TID   enoxaparin (LOVENOX) injection  40 mg Subcutaneous Q24H   memantine  28  mg Oral Daily   metoprolol succinate  25 mg Oral Daily   pantoprazole  40 mg Oral BID   QUEtiapine  25 mg Oral QHS   sertraline  75 mg Oral q AM   Continuous Infusions:     LOS: 8 days    Time spent: 35 minutes.     Elmarie Shiley, MD [facility]   If 7PM-7AM, please contact night-coverage [email or web address]  04/18/2021, 2:05 PM

## 2021-04-19 MED ORDER — QUETIAPINE FUMARATE 25 MG PO TABS: 12.5000 mg | ORAL_TABLET | Freq: Every day | ORAL | Status: DC | PRN

## 2021-04-19 MED ORDER — QUETIAPINE FUMARATE 25 MG PO TABS: 12.5000 mg | ORAL_TABLET | Freq: Two times a day (BID) | ORAL | Status: DC | PRN

## 2021-04-19 NOTE — Progress Notes (Signed)
Physical Therapy Treatment Patient Details Name: Jasmine Jones MRN: [id number] DOB: 1925-12-28 Today's Date: 04/19/2021   History of Present Illness This 88 years old female presetned to teh ED with increased confusion, weakness and febrile. Caregiver resports that the patient was found to be confused, she seems to move slower than her usual self and getting about her day.  She was also having difficulty walking and  for some reason they decided to check her temperature it was found to be 102.4.  Patient also reported she fell in the bathroom last night and complains of right hip pain She is unable to tell the mechanism of the fall. The caregiver at bedside reported she fell last week and that she is often confused about timelines for certain events. PMH significant for anxiety, dementia, GERD, A-fib, hypertension    PT Comments    Patient continues to require Max+2 assist for bed mobility and sit<>stand transfers. Pt experience BM after stand and required total assist for pericare and to maintain standing balance. Pt's caregiver and niece present and GOC discussed in regards to mobility and equipment needs. Acute PT will continue to progress as able.    Recommendations for follow up therapy are one component of a multi-disciplinary discharge planning process, led by the attending physician.  Recommendations may be updated based on patient status, additional functional criteria and insurance authorization.  Follow Up Recommendations  Home health PT (return to facility with HHPT services)     Assistance Recommended at Discharge Frequent or constant Supervision/Assistance  Patient can return home with the following Two people to help with walking and/or transfers;Assistance with cooking/housework;Assistance with feeding;Direct supervision/assist for medications management;Assist for transportation;Help with stairs or ramp for entrance;Two people to help with bathing/dressing/bathroom    Equipment Recommendations  Wheelchair (measurements PT);Wheelchair cushion (measurements PT);Hospital bed;Other (comment) (mechanical lift (hoyer))    Recommendations for Other Services       Precautions / Restrictions Precautions Precautions: Fall Restrictions Weight Bearing Restrictions: No     Mobility  Bed Mobility Overal bed mobility: Needs Assistance Bed Mobility: Supine to Sit, Sit to Supine, Rolling Rolling: Max assist, +2 for physical assistance, +2 for safety/equipment   Supine to sit: Max assist, +2 for safety/equipment, HOB elevated Sit to supine: Total assist, Max assist, +2 for physical assistance, +2 for safety/equipment   General bed mobility comments: Max Assist for supine to sit with cues to walk LE's off EOB and to bring trunk up. Total assist to pivot back to supine for controlled trunk lowering and to raise LE's. Rt/Lt rolling completed 2x for repositioning pad with max assist.    Transfers Overall transfer level: Needs assistance Equipment used: Rolling walker (2 wheels) Transfers: Sit to/from Stand Sit to Stand: Max assist, +2 physical assistance           General transfer comment: Max +2 with RW. manual assist to flex knees and reposition feet to prevent anterior slide for power up. stood 4x from EOB, pt sat on bed pan for short period for BM. On each stand manual assist provided for anterior shift of hips for more upright posture and to prevent posterior lean.    Ambulation/Gait                   Stairs             Wheelchair Mobility    Modified Rankin (Stroke Patients Only)       Balance Overall balance assessment: Needs assistance Sitting-balance support: Feet  supported, Bilateral upper extremity supported Sitting balance-Leahy Scale: Fair   Postural control: Posterior lean Standing balance support: Bilateral upper extremity supported, Reliant on assistive device for balance Standing balance-Leahy Scale:  Poor Standing balance comment: pt requried total assist for pericare with Max support to maintain standing balance with RW.                            Cognition Arousal/Alertness: Awake/alert Behavior During Therapy: Flat affect Overall Cognitive Status: History of cognitive impairments - at baseline                                 General Comments: pt cooperative, HOH, expresses fear of falling when sitting EOB.        Exercises      General Comments        Pertinent Vitals/Pain Pain Assessment Pain Assessment: PAINAD Breathing: normal Negative Vocalization: occasional moan/groan, low speech, negative/disapproving quality Facial Expression: smiling or inexpressive Body Language: relaxed Consolability: no need to console PAINAD Score: 1 Pain Location: generalized Pain Descriptors / Indicators: Discomfort, Guarding, Grimacing Pain Intervention(s): Limited activity within patient's tolerance, Monitored during session, Repositioned    Home Living                          Prior Function            PT Goals (current goals can now be found in the care plan section) Acute Rehab PT Goals PT Goal Formulation: Patient unable to participate in goal setting Time For Goal Achievement: 04/25/21 Potential to Achieve Goals: Fair Progress towards PT goals: Progressing toward goals    Frequency    Min 2X/week      PT Plan Current plan remains appropriate    Co-evaluation              AM-PAC PT "6 Clicks" Mobility   Outcome Measure  Help needed turning from your back to your side while in a flat bed without using bedrails?: Total Help needed moving from lying on your back to sitting on the side of a flat bed without using bedrails?: Total Help needed moving to and from a bed to a chair (including a wheelchair)?: Total Help needed standing up from a chair using your arms (e.g., wheelchair or bedside chair)?: Total Help needed to  walk in hospital room?: Total Help needed climbing 3-5 steps with a railing? : Total 6 Click Score: 6    End of Session Equipment Utilized During Treatment: Gait belt Activity Tolerance: Patient tolerated treatment well Patient left: in bed;with call bell/phone within reach;with bed alarm set;with family/visitor present Nurse Communication: Mobility status PT Visit Diagnosis: Other abnormalities of gait and mobility (R26.89);Muscle weakness (generalized) (M62.81);Repeated falls (R29.6)     Time: 1431-1500 PT Time Calculation (min) (ACUTE ONLY): 29 min  Charges:  $Therapeutic Activity: 23-37 mins                     Verner Mould, DPT Acute Rehabilitation Services Office [phone] Pager [phone]     Jacques Navy 04/19/2021, 3:42 PM

## 2021-04-19 NOTE — Progress Notes (Signed)
Pt assisted to dangle at bedside tonight for 15 mins, then assisted back into bed.

## 2021-04-19 NOTE — TOC Progression Note (Addendum)
Transition of Care [facility]) - Progression Note    Patient Details  Name: Jasmine Jones MRN: [id number] Date of Birth: Sep 16, 1925  [facility]) CM/SW Contact  Ross Ludwig, [place] Phone Number: 04/19/2021, 10:59 AM  Clinical Narrative:     CSW attempted to speak to [place] nurse at [facility] to confirm patient can return back today.  CSW was unable to reach her and leave a message.  Per med tech she was off today.    3:15PM  CSW discussed with patient's niece [name], who is in town for a couple of weeks options for discharge.  Patient's niece does not want her to go to SNF unless she has to according to [place].  CSW informed her that [facility] PT and OT can be set up through [employer] at [employer].  CSW spoke to [place], and she can accept patient for [facility] PT and OT.  Patient has a caregiver who is paid privately by patient and her family.  Per patient's family they will work on getting an extra caregivers to help patient stay in her home.  CSW provided a list of private duty caregivers that CSW advised family to start calling to get quotes on services available.    Patient's niece requested a hospital bed, wheelchair, and a hoyer lift for patient, she already has a rollator wheelchair, and bedside commode.  CSW contacted [employer] to request equipment.  [employer] will start processing order and will try to get equipment delivered tomorrow.   Patient's niece [name] was requesting patient to be followed outpatient palliative through [place].  CSW sent a message to hospital liasion to request palliative outpatient to follow.                                                               Expected Discharge Plan: Assisted Living Barriers to Discharge: Other (must enter comment) (return to [employer] pending their approval)  Expected Discharge Plan and Services Expected Discharge Plan: Assisted Living                                               Social  Determinants of Health (SDOH) Interventions    Readmission Risk Interventions No flowsheet data found.

## 2021-04-19 NOTE — Progress Notes (Addendum)
Speech Language Pathology Treatment: Dysphagia  Patient Details Name: Jasmine Jones MRN: [id number] DOB: September 14, 1925 Today's Date: 04/19/2021 Time: 4259-5638 SLP Time Calculation (min) (ACUTE ONLY): 20 min  Assessment / Plan / Recommendation Clinical Impression  Patient seen for skilled SLP treatment with focus on education with patient's long time caregiver Jasmine Jones and her niece (visiting from [place]) Jasmine Jones. Jasmine Jones reported that patient has been eating about "one good meal" a day. At times she will refuse any PO's. Jasmine Jones also reported that patient will occasionally have coughing, getting choked and "purple in the face". Overall, Jasmine Jones does feel that pureed solids is the safest for patient at this time. SLP discussed recommendation to continue with puree solids, thin liquids. Jasmine Jones thinks that Jasmine Jones can provide pureed foods at meals). SLP also discussed expectations if patient went comfort route for feeding and if this occured, she could have some soft solids if desired in small amounts with full supervision for pleasure only. Although patient with some reported coughing incidents during PO intake, SLP not recommending further modifications to diet consistencies at this time. If patient is returning to [facility], may benefit from [facility] SLP to continue to assist with PO recommendations. SLP at acute care level will continue to follow patient to ensure no further needs or education needed.    HPI HPI: 88 y.o. female presented to ED from [place] with fever and weakness.  PMHx anxiety, advancing dementia with increased irritability and refusals to eat, GERD, afib, HTN. CT head no acute finding. DG chest 2/3 no signs of pulm edema or consolidation.      SLP Plan  Continue with current plan of care      Recommendations for follow up therapy are one component of a multi-disciplinary discharge planning process, led by the attending physician.  Recommendations may be updated  based on patient status, additional functional criteria and insurance authorization.    Recommendations  Diet recommendations: Dysphagia 1 (puree);Thin liquid Liquids provided via: Cup;Straw Medication Administration: Whole meds with puree Supervision: Full supervision/cueing for compensatory strategies;Staff to assist with self feeding;Trained caregiver to feed patient Compensations: Small sips/bites;Slow rate;Follow solids with liquid Postural Changes and/or Swallow Maneuvers: Seated upright 90 degrees;Upright 30-60 min after meal                Oral Care Recommendations: Oral care BID;Staff/trained caregiver to provide oral care Follow Up Recommendations: Other (comment) ([place] SLP if returning to [facility]) Assistance recommended at discharge: Frequent or constant Supervision/Assistance SLP Visit Diagnosis: Dysphagia, oral phase (R13.11) Plan: Continue with current plan of care         Sonia Baller, MA, CCC-SLP Speech Therapy

## 2021-04-19 NOTE — Care Management Important Message (Signed)
Important Message  Patient Details IM Letter placed in Patients room. Name: Jasmine Jones MRN: [id number] Date of Birth: 02/07/1926   Medicare Important Message Given:  Yes     Kerin Salen 04/19/2021, 1:16 PM

## 2021-04-19 NOTE — Progress Notes (Signed)
PROGRESS NOTE    Jasmine Jones  HGD:[id number] DOB: 04-Nov-1925 DOA: 04/09/2021 PCP: [name], NP   Brief Narrative: 88 year old with past medical history significant for anxiety, dementia, GERD, A-fib, hypertension presented to the ED with generalized weakness, febrile illness.  Patient was found to be confused, seems to move slower  than her usual self.  Her temperature was checked and it was at 102.  Patient also had a fall a week prior to admission.   Patient was admitted with fever of unknown origin.  She has remained afebrile.  Lactic acidosis has resolved.  She completed IV antibiotics for 5 days.  Assessment & Plan:   Principal Problem:   Fever of unknown origin Active Problems:   Essential hypertension   ATRIAL FIBRILLATION   Dementia (HCC)   Hypokalemia  1-Fever;  Probably related to pneumonia. Lactic acidosis; resolved.  Patient presented with fever, no leukocytosis, UA and chest x-ray unremarkable. Influenza negative, COVID-negative. CT: Irregular and consolidative density in bilateral lung bases most likely representing possible pneumonia. Sepsis ruled out Completed 5 days of cefepime and Flagyl. Concern For aspiration pneumonia component Afebrile. At risk for aspiration.   2-Hypokalemia: Replaced  3-Right hip pain status post fall: Denies pain right hip. CT show posttraumatic deformity of proximal right femur status post ORIF for an intertrochanteric fracture. Discussed with Dr. Ninfa Linden who reviewed the CT, finding consistent with chronic changes.  Pain management.   4-Acute metabolic encephalopathy, Delirium  Dementia: Continue with Namenda and Seroquel.  She was more alert this am on my evaluation, she started to scream and got agitated.  Will order PRN Seroquel BID for agitation. Per family she respond well to Seroquel.    Dysphagia;  Patient at times hold food in mouths, other time has regurgitation of food.  She is currently on  dysphagia 1 diet.  Continue with  PPI BID  Recommend small frequent meals.  At risk for aspiration.  Evaluated by speech, continue with current diet.   Elevated troponins: Flat elevation, may be related to demand ischemia.  Hypertension: Continue with metoprolol  Paroxysmal A-fib: Continue with metoprolol  Chronic bilateral lower extremity edema: Will hold torsemide due to poor oral intake.   Estimated body mass index is 25.65 kg/m as calculated from the following:   Height as of 03/29/21: 5\' 3"  (1.6 m).   Weight as of 03/29/21: 65.7 kg.   DVT prophylaxis: Lovenox Code Status: DNR Family Communication: Caregiver at bedside and niece at bedside.  Disposition Plan:  Status is: Inpatient Remains inpatient appropriate because: plan to return to ALF with [facility] and Palliative care follow up. Plan for discharge 2/14           Consultants:  Palliative  Procedures:   Antimicrobials:    Subjective: She is more alert this am, got agitated.  She ate some grit for breakfast.  Family considering ALF with hospice care vs HH.   Objective: Vitals:   04/18/21 1941 04/19/21 0623 04/19/21 1013 04/19/21 1327  BP: (!) 145/73 129/77 (!) 110/57 (!) 115/57  Pulse: 72 64 66 66  Resp: 18 17  18   Temp: 98.9 F (37.2 C) (!) 97.4 F (36.3 C)  98.6 F (37 C)  TempSrc: Oral Oral  Oral  SpO2: 95% 94%  95%    Intake/Output Summary (Last 24 hours) at 04/19/2021 1430 Last data filed at 04/19/2021 0327 Gross per 24 hour  Intake 60 ml  Output 1000 ml  Net -940 ml    There  were no vitals filed for this visit.  Examination:  General exam: NAD Respiratory system:  CTA Cardiovascular system:  S 1, S 2 RRR Gastrointestinal system: BS present, soft, nt Central nervous system: alert, agitated.  Extremities: No edema   Data Reviewed: I have personally reviewed following labs and imaging studies  CBC: Recent Labs  Lab 04/13/21 0523 04/16/21 0847  WBC 7.9 9.0  HGB 10.9* 12.3   HCT 34.3* 39.1  MCV 82.5 81.8  PLT 141* 741    Basic Metabolic Panel: Recent Labs  Lab 04/13/21 0523 04/15/21 0452 04/16/21 0847 04/18/21 0746  NA 139 134* 137 137  K 3.1* 3.6 3.6 4.2  CL 105 102 102 103  CO2 27 24 27 28   GLUCOSE 97 94 107* 91  BUN 12 13 15 13   CREATININE 0.57 0.44 0.60 0.47  CALCIUM 7.8* 7.9* 8.5* 8.5*    GFR: CrCl cannot be calculated (Unknown ideal weight.). Liver Function Tests: No results for input(s): AST, ALT, ALKPHOS, BILITOT, PROT, ALBUMIN in the last 168 hours.  No results for input(s): LIPASE, AMYLASE in the last 168 hours.  No results for input(s): AMMONIA in the last 168 hours. Coagulation Profile: No results for input(s): INR, PROTIME in the last 168 hours.  Cardiac Enzymes: No results for input(s): CKTOTAL, CKMB, CKMBINDEX, TROPONINI in the last 168 hours. BNP (last 3 results) No results for input(s): PROBNP in the last 8760 hours. HbA1C: No results for input(s): HGBA1C in the last 72 hours. CBG: Recent Labs  Lab 04/16/21 0112  GLUCAP 146*    Lipid Profile: No results for input(s): CHOL, HDL, LDLCALC, TRIG, CHOLHDL, LDLDIRECT in the last 72 hours. Thyroid Function Tests: No results for input(s): TSH, T4TOTAL, FREET4, T3FREE, THYROIDAB in the last 72 hours. Anemia Panel: No results for input(s): VITAMINB12, FOLATE, FERRITIN, TIBC, IRON, RETICCTPCT in the last 72 hours. Sepsis Labs: No results for input(s): PROCALCITON, LATICACIDVEN in the last 168 hours.   Recent Results (from the past 240 hour(s))  Resp Panel by RT-PCR (Flu A&B, Covid) Nasopharyngeal Swab     Status: None   Collection Time: 04/09/21  3:22 PM   Specimen: Nasopharyngeal Swab; Nasopharyngeal(NP) swabs in vial transport medium  Result Value Ref Range Status   SARS Coronavirus 2 by RT PCR NEGATIVE NEGATIVE Final    Comment: (NOTE) SARS-CoV-2 target nucleic acids are NOT DETECTED.  The SARS-CoV-2 RNA is generally detectable in upper respiratory specimens  during the acute phase of infection. The lowest concentration of SARS-CoV-2 viral copies this assay can detect is 138 copies/mL. A negative result does not preclude SARS-Cov-2 infection and should not be used as the sole basis for treatment or other patient management decisions. A negative result may occur with  improper specimen collection/handling, submission of specimen other than nasopharyngeal swab, presence of viral mutation(s) within the areas targeted by this assay, and inadequate number of viral copies(<138 copies/mL). A negative result must be combined with clinical observations, patient history, and epidemiological information. The expected result is Negative.  Fact Sheet for Patients:  EntrepreneurPulse.com.au  Fact Sheet for Healthcare Providers:  IncredibleEmployment.be  This test is no t yet approved or cleared by the [place] FDA and  has been authorized for detection and/or diagnosis of SARS-CoV-2 by FDA under an Emergency Use Authorization (EUA). This EUA will remain  in effect (meaning this test can be used) for the duration of the COVID-19 declaration under Section 564(b)(1) of the Act, 21 U.S.C.section 360bbb-3(b)(1), unless the authorization is terminated  or revoked sooner.       Influenza A by PCR NEGATIVE NEGATIVE Final   Influenza B by PCR NEGATIVE NEGATIVE Final    Comment: (NOTE) The Xpert Xpress SARS-CoV-2/FLU/RSV plus assay is intended as an aid in the diagnosis of influenza from Nasopharyngeal swab specimens and should not be used as a sole basis for treatment. Nasal washings and aspirates are unacceptable for Xpert Xpress SARS-CoV-2/FLU/RSV testing.  Fact Sheet for Patients: EntrepreneurPulse.com.au  Fact Sheet for Healthcare Providers: IncredibleEmployment.be  This test is not yet approved or cleared by the [place] FDA and has been authorized for detection  and/or diagnosis of SARS-CoV-2 by FDA under an Emergency Use Authorization (EUA). This EUA will remain in effect (meaning this test can be used) for the duration of the COVID-19 declaration under Section 564(b)(1) of the Act, 21 U.S.C. section 360bbb-3(b)(1), unless the authorization is terminated or revoked.  Performed at [facility], [place] [address] [place], [place] [address]           Radiology Studies: No results found.      Scheduled Meds:  busPIRone  10 mg Oral TID   enoxaparin (LOVENOX) injection  40 mg Subcutaneous Q24H   memantine  28 mg Oral Daily   metoprolol succinate  25 mg Oral Daily   pantoprazole  40 mg Oral BID   QUEtiapine  25 mg Oral QHS   sertraline  75 mg Oral q AM   Continuous Infusions:     LOS: 9 days    Time spent: 35 minutes.     Elmarie Shiley, MD [facility]   If 7PM-7AM, please contact night-coverage [email or web address]  04/19/2021, 2:30 PM

## 2021-04-20 MED ORDER — HYDROCODONE-ACETAMINOPHEN 5-325 MG PO TABS: 1.0000 | ORAL_TABLET | ORAL | 0 refills | Status: DC | PRN

## 2021-04-20 MED ORDER — QUETIAPINE FUMARATE 25 MG PO TABS: 12.5000 mg | ORAL_TABLET | Freq: Two times a day (BID) | ORAL | 0 refills | Status: DC | PRN

## 2021-04-20 MED ORDER — PANTOPRAZOLE SODIUM 40 MG PO TBEC: 40.0000 mg | DELAYED_RELEASE_TABLET | Freq: Two times a day (BID) | ORAL | 0 refills | Status: DC

## 2021-04-20 NOTE — Plan of Care (Signed)
  Problem: Skin Integrity: Goal: Risk for impaired skin integrity will decrease Outcome: Progressing   

## 2021-04-20 NOTE — Progress Notes (Signed)
Manufacturing engineer [facility]) Hospital Liaison: RN note    Notified by Transition of [place], CSW of  request for [facility] services at [place] after discharge. Chart and patient information under review by [facility] physician. Hospice eligibility pending currently.    Writer spoke with niece, [name] to initiate education related to hospice philosophy, services and team approach to care.  [name] verbalized understanding of information given.  Please send signed and completed DNR form home with patient/family. Patient will need prescriptions for discharge comfort medications.      [facility] aware of the above. Please notify [facility] when patient is ready to leave the unit at discharge. (Call [phone] or [phone] after 5pm.)   A Please do not hesitate to call with questions.   Thank you,   Farrel Gordon, RN, [place] Hospital Liaison   [phone]

## 2021-04-20 NOTE — TOC Transition Note (Addendum)
Transition of Care [facility]) - CM/SW Discharge Note   Patient Details  Name: Jasmine Jones MRN: [id number] Date of Birth: [birth year]  [facility]) CM/SW Contact:  Jasmine Ludwig, LCSW Phone Number: 04/20/2021, 7:56 PM   Clinical Narrative:     Patient will not be returning to ALF with [facility] services, patient's family and [facility] Jasmine Jones have decided to have patient return to ALF with home hospice services.  CSW updated Jasmine Jones at [facility] that patient will be going home with hospice services instead of palliative.  Patient is returning to [place] with home hospice services through Jasmine Jones.  Patient will be transported via EMS, CSW updated patient's niece Jasmine Jones.  CSW to sign off.    Final next level of care: Assisted Living Barriers to Discharge: Barriers Resolved   Patient Goals and CMS Choice Patient states their goals for this hospitalization and ongoing recovery are:: To return back to [facility] Jones ALF with hospice services. CMS Medicare.gov Compare Post Acute Care list provided to:: Patient Represenative (must comment) Choice offered to / list presented to : Jasmine Jones / Guardian  Discharge Placement PASRR number recieved: 04/15/21            Patient chooses bed at: [facility] Patient to be transferred to facility by: [facility] Name of family member notified: Niece Jasmine Jones [phone] Patient and family notified of of transfer: 04/20/21  Discharge Plan and Services                DME Arranged: Hospital bed, Lightweight manual wheelchair with seat cushion DME Agency: [employer] Date DME Agency Contacted: 04/19/21 Time DME Agency Contacted: 32 Representative spoke with at DME Agency: Jasmine Jones HH Arranged: PT, OT [place] Agency: Other - See comment Secondary school teacher) Date [place]: 04/19/21 Time [place]: 1415 Representative spoke with at [place]: [place] ([place]) Interventions      Readmission Risk Interventions No flowsheet data found.

## 2021-04-20 NOTE — Progress Notes (Signed)

## 2021-04-20 NOTE — Discharge Summary (Addendum)
Physician Discharge Summary  Jasmine Jones SEG:[id number] DOB: 1925-04-11 DOA: 04/09/2021  PCP: [name], NP  Admit date: 04/09/2021 Discharge date: 04/20/2021  Admitted From: ALF Disposition:  ALF with hospice care.   Recommendations for Outpatient Follow-up:  Hospice care at ALF>     Discharge Condition; Stable.  CODE STATUS: DNR Diet recommendation: Dysphagia 1  Brief/Interim Summary: 88 year old with past medical history significant for anxiety, dementia, GERD, A-fib, hypertension presented to the ED with generalized weakness, febrile illness.  Patient was found to be confused, seems to move slower  than her usual self.  Her temperature was checked and it was at 102.  Patient also had a fall a week prior to admission.    Patient was admitted with fever of unknown origin.  She has remained afebrile.  Lactic acidosis has resolved.  She completed IV antibiotics for 5 days.     1-Fever;  Probably related to pneumonia. Lactic acidosis; resolved.  Patient presented with fever, no leukocytosis, UA and chest x-ray unremarkable. Influenza negative, COVID-negative. CT: Irregular and consolidative density in bilateral lung bases most likely representing possible pneumonia. Sepsis ruled out. Completed 5 days of cefepime and Flagyl. Concern For aspiration pneumonia component Afebrile. At risk for aspiration.    2-Hypokalemia: Replaced   3-Right hip pain status post fall: Denies pain right hip. CT show posttraumatic deformity of proximal right femur status post ORIF for an intertrochanteric fracture. Discussed with Dr. Ninfa Linden who reviewed the CT, finding consistent with chronic changes.  Pain management.    4-Acute metabolic encephalopathy, Delirium  Dementia: Continue with Namenda and Seroquel.  She was more alert this am on my evaluation, she started to scream and got agitated.  Will order PRN Seroquel BID for agitation. Per family she respond well to Seroquel.       Dysphagia;  Patient at times hold food in mouths, other time has regurgitation of food.  She is currently on dysphagia 1 diet.  Continue with  PPI BID  Recommend small frequent meals.  At risk for aspiration.  Evaluated by speech, continue with current diet.    Elevated troponins: Flat elevation, may be related to demand ischemia.   Hypertension: Continue with metoprolol   Paroxysmal A-fib: Continue with metoprolol   Chronic bilateral lower extremity edema: Will hold torsemide due to poor oral intake.    Plan to discharge to ALF with Hospice care.   Discharge Diagnoses:  Principal Problem:   Fever of unknown origin Active Problems:   Essential hypertension   ATRIAL FIBRILLATION   Dementia (HCC)   Hypokalemia    Discharge Instructions  Discharge Instructions     Diet - low sodium heart healthy   Complete by: As directed    Increase activity slowly   Complete by: As directed    No wound care   Complete by: As directed       Allergies as of 04/20/2021   No Known Allergies      Medication List     STOP taking these medications    amoxicillin 500 MG capsule Commonly known as: AMOXIL   potassium chloride SA 20 MEQ tablet Commonly known as: KLOR-CON M   torsemide 20 MG tablet Commonly known as: DEMADEX   traMADol 50 MG tablet Commonly known as: ULTRAM       TAKE these medications    acetaminophen 325 MG tablet Commonly known as: TYLENOL Take 650 mg by mouth See admin instructions. Take 650 mg by mouth once a day  at noon AND an additional 650 mg every six hours as needed for pain or fever- CANNOT EXCEED 3,000 MG OF TYLENOL FROM ALL COMBINED SOURCES IN 24 HOURS   ALPRAZolam 0.25 MG tablet Commonly known as: XANAX Take 0.25 mg by mouth every 6 (six) hours as needed for anxiety.   busPIRone 10 MG tablet Commonly known as: BUSPAR Take 1 tablet (10 mg total) by mouth 3 (three) times daily.   docusate sodium 100 MG capsule Commonly known  as: COLACE Take 100 mg by mouth 2 (two) times daily as needed (for constipation).   EQ Vision Formula 50+ Caps Take 1 capsule by mouth every 12 (twelve) hours.   famotidine 20 MG tablet Commonly known as: PEPCID Take 1 tablet (20 mg total) by mouth at bedtime.   HYDROcodone-acetaminophen 5-325 MG tablet Commonly known as: NORCO/VICODIN Take 1 tablet by mouth every 4 (four) hours as needed for moderate pain.   Jobst Active 20-68mmHg Medium Misc by Does not apply route. Provide every morning and remove every evening   memantine 28 MG Cp24 24 hr capsule Commonly known as: Namenda XR Take 1 capsule (28 mg total) by mouth daily.   metoprolol succinate 25 MG 24 hr tablet Commonly known as: TOPROL-XL Take 1 tablet (25 mg total) by mouth daily.   pantoprazole 40 MG tablet Commonly known as: PROTONIX Take 1 tablet (40 mg total) by mouth 2 (two) times daily.   QUEtiapine 25 MG tablet Commonly known as: SEROQUEL Take 25 mg by mouth at bedtime. What changed: Another medication with the same name was added. Make sure you understand how and when to take each.   QUEtiapine 25 MG tablet Commonly known as: SEROQUEL Take 0.5 tablets (12.5 mg total) by mouth every 12 (twelve) hours as needed (agitation). What changed: You were already taking a medication with the same name, and this prescription was added. Make sure you understand how and when to take each.   saccharomyces boulardii 250 MG capsule Commonly known as: FLORASTOR Take 250 mg by mouth in the morning and at bedtime.   sertraline 50 MG tablet Commonly known as: ZOLOFT Take 75 mg by mouth in the morning.               Durable Medical Equipment  (From admission, onward)           Start     Ordered   04/19/21 1850  For home use only DME Other see comment  Once       Comments: Harrel Lemon lift  Question:  Length of Need  Answer:  12 Months   04/19/21 1849   04/19/21 1559  For home use only DME lightweight manual  wheelchair with seat cushion  Once       Comments: Patient suffers from weakness, deconditioning  which impairs their ability to perform daily activities like grooming in the home.  A walker will not resolve  issue with performing activities of daily living. A wheelchair will allow patient to safely perform daily activities. Patient is not able to propel themselves in the home using a standard weight wheelchair due to general weakness. Patient can self propel in the lightweight wheelchair. Length of need 12 months . Accessories: elevating leg rests (ELRs), wheel locks, extensions and anti-tippers.   04/19/21 1559   04/19/21 1405  For home use only DME Hospital bed  Once       Question Answer Comment  Length of Need 6 Months   The above medical  condition requires: Patient requires the ability to reposition frequently   Bed type Semi-electric      04/19/21 1404            No Known Allergies  Consultations: Palliative care.    Procedures/Studies: CT Abdomen Pelvis Wo Contrast  Result Date: 04/09/2021 CLINICAL DATA:  Abdominal pain EXAM: CT ABDOMEN AND PELVIS WITHOUT CONTRAST TECHNIQUE: Multidetector CT imaging of the abdomen and pelvis was performed following the standard protocol without IV contrast. RADIATION DOSE REDUCTION: This exam was performed according to the departmental dose-optimization program which includes automated exposure control, adjustment of the mA and/or kV according to patient size and/or use of iterative reconstruction technique. COMPARISON:  CT abdomen and pelvis 09/25/2017 FINDINGS: Lower chest: Heart is enlarged. Irregular strandy and partially consolidative densities identified in the bilateral lung bases most prominent dependently, most likely representing atelectatic changes. Hepatobiliary: No focal liver abnormality is seen. No gallstones, gallbladder wall thickening, or biliary dilatation. Pancreas: Unremarkable. No pancreatic ductal dilatation or surrounding  inflammatory changes. Spleen: Normal in size without focal abnormality. Adrenals/Urinary Tract: Adrenal glands appear within normal limits. Minimal to mild bilateral hydroureteronephrosis, left greater than right. No nephrolithiasis or obstructing calculi identified. The urinary bladder is overly distended with no wall mass visualized. Stomach/Bowel: Small hiatal hernia. No bowel obstruction, free air or pneumatosis. No bowel wall edema identified. Moderate amount of retained fecal material in the colon. Appendix not visualized. Vascular/Lymphatic: Severe atherosclerotic disease. No bulky lymphadenopathy identified. Reproductive: Partially calcified uterine fibroids. No suspicious adnexal mass identified. Other: No ascites. Musculoskeletal: Chronic severe compression fracture deformity of L3 similar to previous study. Severe degenerative changes of the lumbar spine. IMPRESSION: 1. Urinary bladder is significantly distended, correlate clinically. Likely associated minimal to mild bilateral hydroureteronephrosis. 2. Irregular and consolidative densities in the bilateral lung bases, most likely representing atelectatic changes. Correlate for possible pneumonia. 3. Multiple additional chronic findings as described. Electronically Signed   By: Ofilia Neas M.D.   On: 04/09/2021 15:21   CT Head Wo Contrast  Result Date: 04/09/2021 CLINICAL DATA:  Altered mental status EXAM: CT HEAD WITHOUT CONTRAST TECHNIQUE: Contiguous axial images were obtained from the base of the skull through the vertex without intravenous contrast. RADIATION DOSE REDUCTION: This exam was performed according to the departmental dose-optimization program which includes automated exposure control, adjustment of the mA and/or kV according to patient size and/or use of iterative reconstruction technique. COMPARISON:  CT head 12/27/2018 FINDINGS: Brain: No acute intracranial hemorrhage, mass effect, or herniation. No extra-axial fluid collections.  No evidence of acute territorial infarct. No hydrocephalus. 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 Mild cortical volume loss. Patchy hypodensities in the periventricular and subcortical white matter, likely secondary to chronic microvascular ischemic changes. Vascular: Calcified plaques in the carotid siphons. Skull: Normal. Negative for fracture or focal lesion. Sinuses/Orbits: No acute finding. Other: None. IMPRESSION: Chronic changes with no acute intracranial process identified. Electronically Signed   By: Ofilia Neas M.D.   On: 04/09/2021 15:22   CT Hip Right Wo Contrast  Result Date: 04/09/2021 CLINICAL DATA:  Hip trauma, fracture suspected. Difficulty walking since last night. History of right hip fracture with fixation 12/28/2018. EXAM: CT OF THE RIGHT HIP WITHOUT CONTRAST TECHNIQUE: Multidetector CT imaging of the right hip was performed according to the standard protocol. Multiplanar CT image reconstructions were also generated. RADIATION DOSE REDUCTION: This exam was performed according to the departmental dose-optimization program which includes automated exposure control, adjustment of the mA and/or kV according to patient  size and/or use of iterative reconstruction technique. COMPARISON:  Radiographs 12/27/2018 and 12/28/2018. Pelvic CT today. FINDINGS: Bones/Joint/Cartilage Status post right femoral dynamic screw and intramedullary nail fixation of a comminuted intertrochanteric femur fracture. The distal end of the intramedullary nail is not imaged. The visualized hardware is intact without loosening. Chronic posttraumatic deformity related to the healed intertrochanteric fracture. No evidence of acute fracture or dislocation. There are right hip degenerative changes with a probable ossified loose body anteriorly. Ligaments Suboptimally assessed by CT. Muscles and Tendons Mild fatty atrophy of the gluteus musculature. Otherwise unremarkable. Soft tissues Iliofemoral  atherosclerosis. Internal pelvic contents are dictated separately but include calcified uterine fibroids and bladder distension. IMPRESSION: 1. Posttraumatic deformity of the proximal right femur status post ORIF for an intertrochanteric fracture. 2. No evidence of acute fracture or dislocation. 3. No acute soft tissue findings are identified. Electronically Signed   By: Richardean Sale M.D.   On: 04/09/2021 15:21   DG Bone Density  Result Date: 03/31/2021 EXAM: DUAL X-RAY ABSORPTIOMETRY (DXA) FOR BONE MINERAL DENSITY IMPRESSION: Referring Physician:  [name] Your patient completed a bone mineral density test using GE Lunar iDXA system (analysis version: 16). Technologist: [place] PATIENT: Name: Arabel, Barcenas Patient ID: [id number] Birth Date: [birth year] Height: 59.0 in. Sex: Female Measured: 03/31/2021 Weight: 117.0 lbs. Indications: Advanced Age, Caucasian, Estrogen Deficient, Postmenopausal, Right hip replacement, scoliosis Fractures: Right Hip Treatments: None ASSESSMENT: The BMD measured at Femur Neck is 0.614 g/cm2 with a T-score of -3.1. This patient is considered osteoporotic according to [facility]) criteria. The quality of the exam is good. The lumbar spine was excluded due to degenerative changes. Right hip excluded due to surgical hardware. Site Region Measured Date Measured Age YA BMD Significant CHANGE T-score Left Femur Neck 03/31/2021 94.0 -3.1 0.614 g/cm2 Left Femur Total 03/31/2021 94.0 -2.7 0.663 g/cm2 Left Forearm Radius 33% 03/31/2021 94.0 -2.9 0.626 g/cm2 World Health Organization [facility]) criteria for post-menopausal, Caucasian Women: Normal       T-score at or above -1 SD Osteopenia   T-score between -1 and -2.5 SD Osteoporosis T-score at or below -2.5 SD RECOMMENDATION: 1. All patients should optimize calcium and vitamin D intake. 2. Consider FDA-approved medical therapies in postmenopausal women and men aged 15 years and older, based on the following: a.  A hip or vertebral (clinical or morphometric) fracture. b. T-score = -2.5 at the femoral neck or spine after appropriate evaluation to exclude secondary causes. c. Low bone mass (T-score between -1.0 and -2.5 at the femoral neck or spine) and a 10-year probability of a hip fracture = 3% or a 10-year probability of a major osteoporosis-related fracture = 20% based on the [place]-adapted WHO algorithm. d. Clinician judgment and/or patient preferences may indicate treatment for people with 10-year fracture probabilities above or below these levels. FOLLOW-UP: Patients with diagnosis of osteoporosis or at high risk for fracture should have regular bone mineral density tests.? Patients eligible for Medicare are allowed routine testing every 2 years.? The testing frequency can be increased to one year for patients who have rapidly progressing disease, are receiving or discontinuing medical therapy to restore bone mass, or have additional risk factors. I have reviewed this study and agree with the findings. [facility] Electronically Signed   By: Elmer Picker M.D.   On: 03/31/2021 14:55   DG Chest Port 1 View  Result Date: 04/09/2021 CLINICAL DATA:  Possible sepsis, fever EXAM: PORTABLE CHEST 1 VIEW COMPARISON:  05/01/2019 FINDINGS: Transverse  diameter of heart is increased. There are no signs of pulmonary edema or focal pulmonary consolidation. There is minimal blunting of left lateral CP angle. There is no pneumothorax. Degenerative changes are noted in both shoulders, more severe on the left side. There is soft tissue calcification adjacent to the lateral margin of right scapula which has not changed significantly, possibly a loose body. IMPRESSION: Cardiomegaly. There are no signs of pulmonary edema or focal pulmonary consolidation. Blunting of left lateral CP angle may suggest minimal effusion or pleural thickening. Electronically Signed   By: Elmer Picker M.D.   On: 04/09/2021 11:27      Subjective: Sleepy. Received ativan prior to my evaluation.   Discharge Exam: Vitals:   04/19/21 2053 04/20/21 1348  BP: 127/62 (!) 103/59  Pulse: 74 69  Resp: 20 20  Temp: 99 F (37.2 C) 98.5 F (36.9 C)  SpO2: 92% 90%     General: sleepy Cardiovascular: RRR, S1/S2 +, no rubs, no gallops Respiratory: CTA bilaterally, no wheezing, no rhonchi Abdominal: Soft, NT, ND, bowel sounds + Extremities: no edema, no cyanosis    The results of significant diagnostics from this hospitalization (including imaging, microbiology, ancillary and laboratory) are listed below for reference.     Microbiology: No results found for this or any previous visit (from the past 240 hour(s)).   Labs: BNP (last 3 results) Recent Labs    04/09/21 1053  BNP 810.1*   Basic Metabolic Panel: Recent Labs  Lab 04/15/21 0452 04/16/21 0847 04/18/21 0746  NA 134* 137 137  K 3.6 3.6 4.2  CL 102 102 103  CO2 24 27 28   GLUCOSE 94 107* 91  BUN 13 15 13   CREATININE 0.44 0.60 0.47  CALCIUM 7.9* 8.5* 8.5*   Liver Function Tests: No results for input(s): AST, ALT, ALKPHOS, BILITOT, PROT, ALBUMIN in the last 168 hours. No results for input(s): LIPASE, AMYLASE in the last 168 hours. No results for input(s): AMMONIA in the last 168 hours. CBC: Recent Labs  Lab 04/16/21 0847  WBC 9.0  HGB 12.3  HCT 39.1  MCV 81.8  PLT 173   Cardiac Enzymes: No results for input(s): CKTOTAL, CKMB, CKMBINDEX, TROPONINI in the last 168 hours. BNP: Invalid input(s): POCBNP CBG: Recent Labs  Lab 04/16/21 0112  GLUCAP 146*   D-Dimer No results for input(s): DDIMER in the last 72 hours. Hgb A1c No results for input(s): HGBA1C in the last 72 hours. Lipid Profile No results for input(s): CHOL, HDL, LDLCALC, TRIG, CHOLHDL, LDLDIRECT in the last 72 hours. Thyroid function studies No results for input(s): TSH, T4TOTAL, T3FREE, THYROIDAB in the last 72 hours.  Invalid input(s): FREET3 Anemia work up No  results for input(s): VITAMINB12, FOLATE, FERRITIN, TIBC, IRON, RETICCTPCT in the last 72 hours. Urinalysis    Component Value Date/Time   COLORURINE YELLOW 04/09/2021 1358   APPEARANCEUR CLEAR 04/09/2021 1358   LABSPEC 1.015 04/09/2021 1358   PHURINE 6.0 04/09/2021 1358   GLUCOSEU NEGATIVE 04/09/2021 1358   HGBUR NEGATIVE 04/09/2021 1358   BILIRUBINUR NEGATIVE 04/09/2021 1358   BILIRUBINUR Negative 09/08/2020 1457   KETONESUR NEGATIVE 04/09/2021 1358   PROTEINUR NEGATIVE 04/09/2021 1358   UROBILINOGEN negative (A) 09/08/2020 1457   NITRITE NEGATIVE 04/09/2021 1358   LEUKOCYTESUR TRACE (A) 04/09/2021 1358   Sepsis Labs Invalid input(s): PROCALCITONIN,  WBC,  LACTICIDVEN Microbiology No results found for this or any previous visit (from the past 240 hour(s)).   Time coordinating discharge: 40 minutes  SIGNED:  Elmarie Shiley, MD  [facility]
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
## 2021-04-20 NOTE — [place] FL2 (Addendum)
[place] MEDICAID FL2 LEVEL OF CARE SCREENING TOOL     IDENTIFICATION  Patient Name: Jasmine Jones Birthdate: January 27, 1926 Sex: female Admission Date (Current Location): 04/09/2021  [facility] and [place] Number:  Herbalist and Address:  [facility],  [address] [place], [place]      Provider Number: [id number]  Attending Physician Name and Address:  Elmarie Shiley, MD  Relative Name and Phone Number:  Rancourt,[name] Other   [phone]  Scotton,Sue Niece   [phone]  Davis,Carol Niece   [phone]    Current Level of Care: Hospital Recommended Level of Care: Assisted Living Facility Prior Approval Number:    Date Approved/Denied:   PASRR Number:    Discharge Plan: Other (Comment) ([facility])    Current Diagnoses: Patient Active Problem List   Diagnosis Date Noted   Fever of unknown origin 04/09/2021   Hypokalemia 04/09/2021   Aortic atherosclerosis ([place]) 09/30/2020   Closed right hip fracture, initial encounter ([place]) 12/27/2018   Depression with anxiety 12/27/2018   Dementia ([place]) 12/27/2018   GOITER, MULTINODULAR 08/15/2007   CHEST PAIN 08/15/2007   HYPERLIPIDEMIA 08/14/2007   INSOMNIA, CHRONIC 08/14/2007   Essential hypertension 08/14/2007   ATRIAL FIBRILLATION 08/14/2007   Osteoarthritis 08/14/2007   Osteoporosis 08/14/2007    Orientation RESPIRATION BLADDER Height & Weight     Self, Place  Normal Incontinent Weight: 144 lb 10 oz (65.6 kg) Height:  5\' 3"  (160 cm)  BEHAVIORAL SYMPTOMS/MOOD NEUROLOGICAL BOWEL NUTRITION STATUS      Incontinent Diet (puree solids, thin liquids)  AMBULATORY STATUS COMMUNICATION OF NEEDS Skin   Limited Assist Verbally Normal                       Personal Care Assistance Level of Assistance  Bathing, Feeding, Dressing Bathing Assistance: Limited assistance Feeding assistance: Limited assistance Dressing Assistance: Limited assistance     Functional Limitations  Info  Sight, Hearing, Speech Sight Info: Adequate Hearing Info: Impaired Speech Info: Adequate    SPECIAL CARE FACTORS FREQUENCY                       Contractures Contractures Info: Not present    Additional Factors Info  Code Status, Allergies, Psychotropic Code Status Info: DNR Allergies Info: NKA Psychotropic Info: busPIRone (BUSPAR) tablet 10 mg, QUEtiapine (SEROQUEL) tablet 25 mg         Current Medications (04/20/2021):  This is the current hospital active medication list Current Facility-Administered Medications  Medication Dose Route Frequency Provider Last Rate Last Admin   acetaminophen (TYLENOL) tablet 650 mg  650 mg Oral Q6H PRN Marylyn Ishihara, Tyrone A, DO   650 mg at 04/18/21 2952   Or   acetaminophen (TYLENOL) suppository 650 mg  650 mg Rectal Q6H PRN Marylyn Ishihara, Tyrone A, DO       busPIRone (BUSPAR) tablet 10 mg  10 mg Oral TID Marylyn Ishihara, Tyrone A, DO   10 mg at 04/20/21 1020   docusate sodium (COLACE) capsule 100 mg  100 mg Oral BID PRN Cherylann Ratel A, DO       enoxaparin (LOVENOX) injection 40 mg  40 mg Subcutaneous Q24H Regalado, Belkys A, MD   40 mg at 04/19/21 2230   haloperidol lactate (HALDOL) injection 0.5 mg  0.5 mg Intravenous Q6H PRN Regalado, Belkys A, MD       HYDROcodone-acetaminophen (NORCO/VICODIN) 5-325 MG per tablet 1-2 tablet  1-2 tablet Oral Q4H PRN Marylyn Ishihara,  Tyrone A, DO   2 tablet at 04/18/21 2120   lip balm (CARMEX) ointment   Topical PRN Regalado, Belkys A, MD   Given at 04/16/21 1441   LORazepam (ATIVAN) injection 0.5 mg  0.5 mg Intravenous Q6H PRN Marylyn Ishihara, Tyrone A, DO   0.5 mg at 04/20/21 1020   memantine (NAMENDA XR) 24 hr capsule 28 mg  28 mg Oral Daily Kyle, Tyrone A, DO   28 mg at 04/20/21 1438   metoprolol succinate (TOPROL-XL) 24 hr tablet 25 mg  25 mg Oral Daily Kyle, Tyrone A, DO   25 mg at 04/20/21 1020   ondansetron (ZOFRAN) tablet 4 mg  4 mg Oral Q6H PRN Marylyn Ishihara, Tyrone A, DO       Or   ondansetron (ZOFRAN) injection 4 mg  4 mg Intravenous Q6H PRN  Marylyn Ishihara, Tyrone A, DO       pantoprazole (PROTONIX) EC tablet 40 mg  40 mg Oral BID Regalado, Belkys A, MD   40 mg at 04/20/21 1019   QUEtiapine (SEROQUEL) tablet 12.5 mg  12.5 mg Oral Q12H PRN Regalado, Belkys A, MD       QUEtiapine (SEROQUEL) tablet 25 mg  25 mg Oral QHS Kyle, Tyrone A, DO   25 mg at 04/19/21 2230   sertraline (ZOLOFT) tablet 75 mg  75 mg Oral q AM Marylyn Ishihara, Tyrone A, DO   75 mg at 04/20/21 1023     Discharge Medications:  STOP taking these medications     amoxicillin 500 MG capsule Commonly known as: AMOXIL    potassium chloride SA 20 MEQ tablet Commonly known as: KLOR-CON M    torsemide 20 MG tablet Commonly known as: DEMADEX    traMADol 50 MG tablet Commonly known as: ULTRAM           TAKE these medications     acetaminophen 325 MG tablet Commonly known as: TYLENOL Take 650 mg by mouth See admin instructions. Take 650 mg by mouth once a day at noon AND an additional 650 mg every six hours as needed for pain or fever- CANNOT EXCEED 3,000 MG OF TYLENOL FROM ALL COMBINED SOURCES IN 24 HOURS    ALPRAZolam 0.25 MG tablet Commonly known as: XANAX Take 0.25 mg by mouth every 6 (six) hours as needed for anxiety.    busPIRone 10 MG tablet Commonly known as: BUSPAR Take 1 tablet (10 mg total) by mouth 3 (three) times daily.    docusate sodium 100 MG capsule Commonly known as: COLACE Take 100 mg by mouth 2 (two) times daily as needed (for constipation).    EQ Vision Formula 50+ Caps Take 1 capsule by mouth every 12 (twelve) hours.    famotidine 20 MG tablet Commonly known as: PEPCID Take 1 tablet (20 mg total) by mouth at bedtime.    HYDROcodone-acetaminophen 5-325 MG tablet Commonly known as: NORCO/VICODIN Take 1 tablet by mouth every 4 (four) hours as needed for moderate pain.    Jobst Active 20-38mmHg Medium Misc by Does not apply route. Provide every morning and remove every evening    memantine 28 MG Cp24 24 hr capsule Commonly known as: Namenda  XR Take 1 capsule (28 mg total) by mouth daily.    metoprolol succinate 25 MG 24 hr tablet Commonly known as: TOPROL-XL Take 1 tablet (25 mg total) by mouth daily.    pantoprazole 40 MG tablet Commonly known as: PROTONIX Take 1 tablet (40 mg total) by mouth 2 (two) times daily.  QUEtiapine 25 MG tablet Commonly known as: SEROQUEL Take 25 mg by mouth at bedtime. What changed: Another medication with the same name was added. Make sure you understand how and when to take each.    QUEtiapine 25 MG tablet Commonly known as: SEROQUEL Take 0.5 tablets (12.5 mg total) by mouth every 12 (twelve) hours as needed (agitation). What changed: You were already taking a medication with the same name, and this prescription was added. Make sure you understand how and when to take each.    saccharomyces boulardii 250 MG capsule Commonly known as: FLORASTOR Take 250 mg by mouth in the morning and at bedtime.    sertraline 50 MG tablet Commonly known as: ZOLOFT Take 75 mg by mouth in the morning.     Relevant Imaging Results:  Relevant Lab Results:   Additional Information SSN [id number]  Ross Ludwig, LCSW

## 2021-04-20 NOTE — Plan of Care (Signed)
No acute events overnight. Problem: Pain Managment: Goal: General experience of comfort will improve Outcome: Progressing   Problem: Skin Integrity: Goal: Risk for impaired skin integrity will decrease Outcome: Progressing

## 2021-04-21 ENCOUNTER — Telehealth: Payer: Self-pay

## 2021-04-21 NOTE — Telephone Encounter (Signed)
Robert with [facility] called to confirm that Janett Billow will be the attending for Ms.Dettore.  Please advise

## 2021-04-22 NOTE — Telephone Encounter (Signed)
Noted and Herbie Baltimore is aware

## 2021-04-22 NOTE — Telephone Encounter (Signed)
Yes will be attending

## 2021-06-21 ENCOUNTER — Telehealth: Payer: Self-pay

## 2021-06-21 NOTE — Telephone Encounter (Signed)
Jasmine Jones from [facility] states she faxed physician order 30 mins ago and need it returned today. Patient will be out of med after today.  ? ?Confirmed forms have been received, signed and faxed back on 06/06/21. ? ?Re-faxed to [phone] ?

## 2021-07-14 ENCOUNTER — Telehealth: Payer: Self-pay | Admitting: *Deleted

## 2021-07-14 NOTE — Telephone Encounter (Signed)
Jasmine Jones with  Jasmine Jones called and stated that she has faxed paperwork that needs to be signed and faxed back. Stated that it has been over a month and they still haven't received it back. Paperwork in in Media done but not stamped faxed. Jasmine Jones stated to resign the paperwork with new date.  ? ?Included in the fax is: ?-Note to Attending Physician to continue or Discontinue Alprazolam 0.25 and Lorazepam 0.5 ?-Care plan.  ? ?Placed fax in [place] folder to review and sign.  ?To be faxed back to [facility] once completed. Fax: [phone] ?

## 2021-07-16 NOTE — Telephone Encounter (Signed)
Paperwork faxed back to [employer] Fax: [phone] ?

## 2021-07-16 NOTE — Telephone Encounter (Signed)
Signed and given back to anita  ?

## 2021-07-21 NOTE — Telephone Encounter (Signed)
Paperwork has been sent again and Janett Billow has signed for the 3rd time.  ?[employer] and confirmed fax #: [phone] ? ?Refaxed.  ?

## 2021-07-26 ENCOUNTER — Ambulatory Visit: Payer: Medicare Other | Admitting: Nurse Practitioner

## 2021-11-08 ENCOUNTER — Other Ambulatory Visit: Payer: Self-pay | Admitting: Nurse Practitioner

## 2021-11-18 ENCOUNTER — Other Ambulatory Visit: Payer: Self-pay | Admitting: Nurse Practitioner

## 2021-11-18 NOTE — Telephone Encounter (Signed)
I see pt's last rx was written in 2021. Please advise if ok to refill pt has upcoming appt

## 2021-11-19 ENCOUNTER — Telehealth: Payer: Self-pay | Admitting: *Deleted

## 2021-11-19 NOTE — Telephone Encounter (Signed)
[name] with [facility] called and stated that they are trying to receive a fax that we are faxing on patient's medications and they only receive the cover page.   [name] stated that she will pick up the fax on Monday from our office.   Copy made and sent to scanning and Copy left up front in filing cabinet for pick up.

## 2021-12-13 ENCOUNTER — Ambulatory Visit: Payer: Medicare Other | Admitting: Nurse Practitioner

## 2022-01-05 DEATH — deceased
